# Patient Record
Sex: Female | Born: 1995
Health system: Southern US, Community
[De-identification: ages and names within clinical notes are randomized; demographics above are authoritative.]

## PROBLEM LIST (undated history)

## (undated) ENCOUNTER — Inpatient Hospital Stay (HOSPITAL_COMMUNITY): Payer: Self-pay

## (undated) DIAGNOSIS — O24419 Gestational diabetes mellitus in pregnancy, unspecified control: Secondary | ICD-10-CM

## (undated) DIAGNOSIS — I10 Essential (primary) hypertension: Secondary | ICD-10-CM

## (undated) DIAGNOSIS — O2230 Deep phlebothrombosis in pregnancy, unspecified trimester: Secondary | ICD-10-CM

## (undated) DIAGNOSIS — B009 Herpesviral infection, unspecified: Secondary | ICD-10-CM

## (undated) DIAGNOSIS — J45909 Unspecified asthma, uncomplicated: Secondary | ICD-10-CM

## (undated) HISTORY — PX: DILATION AND EVACUATION: SHX1459

## (undated) HISTORY — DX: Deep phlebothrombosis in pregnancy, unspecified trimester: O22.30

## (undated) HISTORY — DX: Gestational diabetes mellitus in pregnancy, unspecified control: O24.419

## (undated) HISTORY — DX: Unspecified asthma, uncomplicated: J45.909

---

## 2015-04-17 ENCOUNTER — Ambulatory Visit (INDEPENDENT_AMBULATORY_CARE_PROVIDER_SITE_OTHER): Payer: 59 | Admitting: Family Medicine

## 2015-04-17 VITALS — BP 114/68 | HR 56 | Temp 98.7°F | Resp 18 | Ht 64.5 in | Wt 146.0 lb

## 2015-04-17 DIAGNOSIS — Z3490 Encounter for supervision of normal pregnancy, unspecified, unspecified trimester: Secondary | ICD-10-CM

## 2015-04-17 DIAGNOSIS — Z3200 Encounter for pregnancy test, result unknown: Secondary | ICD-10-CM

## 2015-04-17 LAB — POCT URINE PREGNANCY: Preg Test, Ur: POSITIVE — AB

## 2015-04-17 NOTE — Patient Instructions (Signed)
RESOURCE GUIDE ° °Chronic Pain Problems: °Contact [facility]  [phone] °Patients need to be referred by their primary care doctor. ° °Insufficient Money for Medicine: °Contact United Way:  call (888) 892-1162 ° °No Primary Care Doctor: °- Call [facility]  [phone] - can help you locate a primary care doctor that  accepts your insurance, provides certain services, etc. °- Physician Referral Service- 1-800-533-3463 ° °Agencies that provide inexpensive medical care: °- [facility]  [phone] °- [facility]  [phone] °- [facility]  [phone] °- [facility]  832-4777 °- [facility]  [phone] °- [facility]  [phone] ° °Medicaid-accepting [place] Providers: °- [facility]- [address] ° [phone], Mon-Fri 9am-7pm, Sat 9am-1pm °- [facility]- [address], [address] ° [phone] °- [facility]- [address], [address] ° [phone] °- [facility]- [address] [place] [address] ° [phone] °- Veita B[facility]- [address], [phone] ° Only accepts [place] Access Medicaid patients after they have their name  applied to their card ° °Self Pay (no insurance) in [place]: °- Sickle Cell Patients - [facility] ° [address], [phone] °- [facility]- [address] ° [phone] °      -     [facility]- [address] °      -     [facility]- see information above (Speak to Pam H if you do not have insurance) °      -  [facility] [place]- [address],  [phone] °      -  [facility]- [address] [place] [address], [phone] °      -  Dr Osei-Bonsu-  [address], [place], [phone] °      -  [facility] - [address], [phone] °      -  [facility]- [address] [place] [address], [phone], also  [address], [phone] °      -     [facility]- [address], [phone], 1st & 3rd Saturday °        every month, 10am-1pm ° -     [facility] °  [address], [place]. °  Phone:  [phone], Fax:  [phone]. Hours of Operation:  9 am - 6 pm, M-F. ° -     [facility] °  [address], [address], [place] °  Phone: [phone], Fax: [phone]. Hours of Operation:  8:30 am - 5:30 pm, M-F. ° °[facility] °[address] °[place], [place] [address] °(336) 832-4777 ° °[facility] °[address] °[place], [place] [address] °(336) [phone] ° °1) Find a Doctor and Pay Out of Pocket °Although you won't have to find out who is covered by your insurance plan, it is a good idea to ask around and get recommendations. You will then need to call the office and see if the doctor you have chosen will accept you as a new patient and what types of options they offer for patients who are self-pay. Some doctors offer discounts or will set up payment plans for their patients who do not have insurance, but   you will need to ask so you aren't surprised when you get to your appointment. ° °2) Contact Your Local Health Department °Not all health departments have doctors that can see patients for sick visits, but many do, so it is worth a call to see if yours does. If you don't know where your local health department is, you can check in your phone book. The CDC also has a tool to help you locate your state's health department, and many state websites also have listings of all of their local health departments. ° °3) Find a Walk-in Clinic °If your illness is not likely to be very severe or complicated, you may want to try a walk in clinic. These are popping up all over the country in pharmacies, drugstores, and shopping centers. They're usually staffed by nurse practitioners or physician assistants that have been trained  to treat common illnesses and complaints. They're usually fairly quick and inexpensive. However, if you have serious medical issues or chronic medical problems, these are probably not your best option ° °STD Testing °- [place] [facility] [place], [address], [place], phone [phone] or [phone].  Monday - Friday, call for an appointment. °- [place] [facility] [place], [facility], [address], [place], phone [phone] or [phone].  Monday - Friday, call for an appointment. ° °Abuse/Neglect: °- [place] Child Abuse Hotline (336) 641-3795 °- [place] Child Abuse Hotline 800-378-5315 (After Hours) ° °Emergency Shelter:  [facility] (336) 271-5985 ° °Maternity Homes: °- [facility] (336) 275-9566 °- [facility] (704) 372-4663 ° °MRSA Hotline #:   832-7006 ° °Dental Assistance °If unable to pay or uninsured, contact:  [place] [facility] to become qualified for the adult dental clinic. ° °Patients with Medicaid: [facility] °[address], [phone] °[address], [phone] ° °If unable to pay, or uninsured, contact [place] [facility] ([phone] in [place], 842-7733 in [place]) to become qualified for the adult dental clinic ° °[facility] °[address] °[place], [place] [address] °(336) 272-4177 °www.[facility].com ° °Other Low-Cost Community Dental Services: °- [facility]- [address], [place], [place], [address], 723-1848, Ext. [address], 2nd and 4th Thursday of the month at 6:30am.  10 clients each day by appointment, can sometimes see walk-in patients if someone does not show for an appointment. °- [facility]- [address], [place], [place], [address], [phone] °- [facility]- [address], [place], [place], [address], 631-2330 °- [place]  [facility]- 342-8273 °- [facility]- 703-3100 °- [facility]- 570-6415 ° °     Behavioral Health Resources in the Community ° °Intensive Outpatient Programs: °[facility]      °[address] °[place], [place] °336-878-6098 °Both a day and evening program °      °[facility]     °[address]        °[place], [place] [address] °336-832-9800        ° °[facility] °[address] °[place] °336-882-2125 ° °[place] °ACCESS LINE: 1-800-853-5163 or 336-641-4981 °[address] °[place], [place] [address] °Http://www.[email or web address].com/services/adult.htm ° ° °Substance Abuse Resources: °- [facility]  336-882-2125 °- [facility] 336-784-9470 °- [facility] [place] [facility] 336-285-9073 °- [facility] 336-845-3988 °-   Residential & Outpatient Substance Abuse Program  800-659-3381 ° °Psychological Services: °- [facility]  832-9600 °- [facility]  378-7881 °- [facility], [address], [place], ACCESS LINE: 1-800-853-5163 or 336-641-4981, Http://www.[facility].com/services/adult.htm ° °Mobile Crisis Teams:         °                               °[facility]         °Mobile Crisis Care Unit °1-877-626-1772       °      °Assertive °[facility] °[address] [place] °336-834-9664 °                                        °Interventionist °Sharon DeEsch °[address] °[place] °336-554-5454 ° °Self-Help/Support Groups: °[facility] Variety of support groups °373-1402 (call for more info) ° °Narcotics Anonymous (NA) °[facility] °[address] °[place] - 2 meetings at this location ° °Residential Treatment Programs:  °[facility]      °[address]        °[place]       °866-801-8205        ° °[facility] °[address] °[place], [place]  [address] °704-293-8524 ° °[facility]  °[address] °[place], [place] [address] °336-845-3988 °Admissions: 8am-3pm M-F ° °[facility]     °[address]        °[place], [place] [address]       °336-841-1104        ° °[facility] °[address] °[place], [place] °336-379-7146 ° °[facility] [place] [facility] °[address] °[place], [place] °336-285-9073 ° °[facility] - Intensive Outpatient      °[address]     °[place], [place]       °852-3033        ° °[facility]     °[address] °[place], [place] °877-615-2722 or 336-784-9470 ° °[facility]), Medicaid °[address] °[place], [place] °336-227-7417 ° °[facility]                                               °[address] °[place] °800-659-3381 ° °[place] [facility]: °[facility]- 1-888-581-9988              ° °General Therapy                                                °Julie Brannon, PhD        °[address]                                       °[place],  [address]         °336-349-5553   °Insurance ° °[facility]   °[address] °[place], [place] [address] °336-349-4454 ° °[facility] °[address] [place], [place] [address] °336-342-8316 °Insurance/Medicaid/sponsorship through [facility] ° °[facility]                                              °[address]                                        °[place], [place] [address]    °Therapy/tele-psych/case         °336-342-8316        °  °[facility] °[address]  ° [place], [place]  [address]  °Adolescent/group home/case management °336-349-2233  °                                         °Julia Brannon PhD       °General therapy       °Insurance   °336-951-0000        ° °Dr. Arfeen, Insurance, M-F °336- 349-4544 ° °[facility] [place]  [facility]  [employer] [place] [facility] °[address]                 [address]         [address]  °[place]                                               [place] °Phone:  349-3220                                  Phone:  [phone]                   Phone:  342-8140 ° °[facility], 342-8316 °- [place] [facility] - [facility]- 1-888-581-9988 °      -     [facility] in [place], [address], °            336-349-4454, Insurance ° °[place] Child Abuse Hotline °(336) 342-1394 or (336) 342-3537 (After Hours) ° ° °

## 2015-04-17 NOTE — Progress Notes (Signed)
      Chief Complaint:  Chief Complaint  Patient presents with  . pregnancy test    took test two days ago and was positive    HPI: Tanya Fisher is a 19 y.o. female [facility] complaining of Wants pregnancy test, home ones are positive, LMP March 15, 2015  EDD based on LMP is 12/19/2015 [id number] No STD No alcohol Smoker: 1 cigars daily No ttaking MVT No chromsoomal abnormalities.     Past Medical History  Diagnosis Date  . Asthma    History reviewed. No pertinent past surgical history. Social History   Social History  . Marital Status: Single    Spouse Name: N/A  . Number of Children: N/A  . Years of Education: N/A   Social History Main Topics  . Smoking status: Current Every Day Smoker  . Smokeless tobacco: None  . Alcohol Use: No  . Drug Use: None  . Sexual Activity: Not Asked   Other Topics Concern  . None   Social History Narrative  . None   Family History  Problem Relation Age of Onset  . Hypertension Mother   . Diabetes Father   . Hypertension Father    No Known Allergies Prior to Admission medications   Not on File     ROS: The patient denies fevers, chills, night sweats, unintentional weight loss, chest pain, palpitations, wheezing, dyspnea on exertion, nausea, vomiting, abdominal pain, dysuria, hematuria, melena, numbness, weakness, or tingling.   All other systems have been reviewed and were otherwise negative with the exception of those mentioned in the HPI and as above.    PHYSICAL EXAM: Filed Vitals:   04/17/15 1205  BP: 114/68  Pulse: 56  Temp: 98.7 F (37.1 C)  Resp: 18   Body mass index is 24.68 kg/(m^2).   General: Alert, no acute distress HEENT:  Normocephalic, atraumatic, oropharynx patent. EOMI, PERRLA Cardiovascular:  Regular rate and rhythm, no rubs murmurs or gallops.  No Carotid bruits, radial pulse intact. No pedal edema.  Respiratory: Clear to auscultation bilaterally.  No wheezes, rales, or  rhonchi.  No cyanosis, no use of accessory musculature Abdominal: No organomegaly, abdomen is soft and non-tender, positive bowel sounds. No masses. Skin: No rashes. Neurologic: Facial musculature symmetric. Psychiatric: Patient acts appropriately throughout our interaction. Lymphatic: No cervical or submandibular lymphadenopathy Musculoskeletal: Gait intact. No edema, tenderness   LABS: Results for orders placed or performed in visit on 04/17/15  POCT urine pregnancy  Result Value Ref Range   Preg Test, Ur Positive (A) Negative     EKG/XRAY:   Primary read [facility]   ASSESSMENT/PLAN: Encounter Diagnoses  Name Primary?  . Possible pregnancy   . Pregnant Yes   [facility] Encourage prenatal mvt  Gross sideeffects, risk and benefits, and alternatives of medications d/w patient. Patient is aware that all medications have potential sideeffects and we are unable to predict every sideeffect or drug-drug interaction that may occur.  Thao Le DO  04/17/2015 12:12 PM

## 2016-10-30 ENCOUNTER — Encounter: Payer: Self-pay | Admitting: Family Medicine

## 2016-10-30 ENCOUNTER — Ambulatory Visit (INDEPENDENT_AMBULATORY_CARE_PROVIDER_SITE_OTHER): Payer: 59 | Admitting: Family Medicine

## 2016-10-30 VITALS — BP 121/69 | HR 66 | Temp 98.4°F | Resp 16 | Ht 64.0 in | Wt 157.0 lb

## 2016-10-30 DIAGNOSIS — R350 Frequency of micturition: Secondary | ICD-10-CM

## 2016-10-30 DIAGNOSIS — R300 Dysuria: Secondary | ICD-10-CM | POA: Diagnosis not present

## 2016-10-30 DIAGNOSIS — N3001 Acute cystitis with hematuria: Secondary | ICD-10-CM | POA: Diagnosis not present

## 2016-10-30 LAB — POCT URINALYSIS DIP (MANUAL ENTRY)
BILIRUBIN UA: NEGATIVE
BILIRUBIN UA: NEGATIVE mg/dL
GLUCOSE UA: NEGATIVE mg/dL
NITRITE UA: NEGATIVE
Spec Grav, UA: 1.01 (ref 1.010–1.025)
Urobilinogen, UA: 0.2 E.U./dL
pH, UA: 5.5 (ref 5.0–8.0)

## 2016-10-30 LAB — POC MICROSCOPIC URINALYSIS (UMFC): MUCUS RE: ABSENT

## 2016-10-30 LAB — POCT URINE PREGNANCY: Preg Test, Ur: NEGATIVE

## 2016-10-30 MED ORDER — NITROFURANTOIN MONOHYD MACRO 100 MG PO CAPS: 100.0000 mg | ORAL_CAPSULE | Freq: Two times a day (BID) | ORAL | 0 refills | Status: DC

## 2016-10-30 MED ORDER — FLUCONAZOLE 150 MG PO TABS: 150.0000 mg | ORAL_TABLET | Freq: Once | ORAL | 0 refills | Status: AC

## 2016-10-30 MED ORDER — PRENATAL MULTIVITAMIN CH: 1.0000 | ORAL_TABLET | Freq: Every day | ORAL | 4 refills | Status: DC

## 2016-10-30 NOTE — Progress Notes (Signed)
Subjective:  By signing my name below, I, Stann Ore, attest that this documentation has been prepared under the direction and in the presence of Norberto Sorenson, MD. Electronically Signed: Stann Ore, Scribe. 10/30/2016 , 3:48 PM .  Patient was seen in Room 3 .   Patient ID: Tanya Fisher, female    DOB: 06-Feb-1996, 21 y.o.   MRN: [id number] Chief Complaint  Patient presents with  . Urinary Tract Infection    Dysuria, Urinary Frequency & Urgency   HPI Tanya Fisher is a 21 y.o. female who presents to Primary Care at [facility] complaining of possible UTI with dysuria, urinary frequency and urgency that started about 3 days ago. She describes dysuria being more "tingly", rather than burning. She's had an UTI in the past, but not since she was pregnant. She would normally self treat for yeast infections with mono stat. She mentions having some vaginal itching. Her periods and bowel has been normal. She denies bubble baths or swimming. She denies any recent antibiotics use. She hasn't taken any OTC medications for this. She denies vaginal discharge, fever, chills, nausea, vomiting or back pain.   She is a former smoker, quit over a year ago.   Past Medical History:  Diagnosis Date  . Asthma    Prior to Admission medications   Not on File   No Known Allergies  Review of Systems  Constitutional: Negative for chills, fatigue, fever and unexpected weight change.  Respiratory: Negative for cough.   Gastrointestinal: Negative for constipation, diarrhea, nausea and vomiting.  Genitourinary: Positive for dysuria, frequency and urgency. Negative for vaginal discharge.  Skin: Negative for rash and wound.  Neurological: Negative for dizziness, weakness and headaches.       Objective:   Physical Exam  Constitutional: She is oriented to person, place, and time. She appears well-developed and well-nourished. No distress.  HENT:  Head: Normocephalic and atraumatic.  Eyes: EOM are normal.  Pupils are equal, round, and reactive to light.  Neck: Neck supple.  Cardiovascular: Normal rate, regular rhythm, S1 normal, S2 normal and normal heart sounds.   No murmur heard. Pulmonary/Chest: Effort normal and breath sounds normal. No respiratory distress. She has no wheezes.  Abdominal: Soft. Bowel sounds are normal. She exhibits no distension. There is no hepatosplenomegaly. There is no tenderness. There is no CVA tenderness.  Musculoskeletal: Normal range of motion.  Neurological: She is alert and oriented to person, place, and time.  Skin: Skin is warm and dry.  Psychiatric: She has a normal mood and affect. Her behavior is normal.  Nursing note and vitals reviewed.   BP 121/69   Pulse 66   Temp 98.4 F (36.9 C) (Oral)   Resp 16   Ht  (1.626 m)   Wt 157 lb (71.2 kg)   LMP 10/03/2016   SpO2 100%   BMI 26.95 kg/m    Results for orders placed or performed in visit on 10/30/16  POCT Microscopic Urinalysis ([facility])  Result Value Ref Range   WBC,UR,HPF,POC Too numerous to count  (A) None WBC/hpf   RBC,UR,HPF,POC Few (A) None RBC/hpf   Bacteria Moderate (A) None, Too numerous to count   Mucus Absent Absent   Epithelial Cells, UR Per Microscopy Few (A) None, Too numerous to count cells/hpf  POCT urinalysis dipstick  Result Value Ref Range   Color, UA yellow yellow   Clarity, UA hazy (A) clear   Glucose, UA negative negative mg/dL   Bilirubin, UA negative negative  Ketones, POC UA negative negative mg/dL   Spec Grav, UA 1.610 9.604 - 1.025   Blood, UA moderate (A) negative   pH, UA 5.5 5.0 - 8.0   Protein Ur, POC trace (A) negative mg/dL   Urobilinogen, UA 0.2 0.2 or 1.0 E.U./dL   Nitrite, UA Negative Negative   Leukocytes, UA Large (3+) (A) Negative  POCT urine pregnancy  Result Value Ref Range   Preg Test, Ur Negative Negative        Assessment & Plan:   1. Dysuria   2. Urinary frequency   3. Acute cystitis with hematuria   Pt requests UPT today but  declines any contraception stating she has been on all of it before and it does not agree with her. She has a 10 mo son who she is still breastfeeding - not trying to get pregnant but ok if she does - "he is going to need someone to play with anyway."  Start pnv. Pt ok with std testing but did not request it and denies any sxs. She does report freq yeast infection which she successfully self-treats with otc monistat.  Orders Placed This Encounter  Procedures  . Urine culture  . GC/Chlamydia Probe Amp  . POCT Microscopic Urinalysis ([facility])  . POCT urinalysis dipstick  . POCT urine pregnancy    Meds ordered this encounter  Medications  . nitrofurantoin, macrocrystal-monohydrate, (MACROBID) 100 MG capsule    Sig: Take 1 capsule (100 mg total) by mouth 2 (two) times daily.    Dispense:  14 capsule    Refill:  0  . fluconazole (DIFLUCAN) 150 MG tablet    Sig: Take 1 tablet (150 mg total) by mouth once. After antibiotic course is complete if needed    Dispense:  1 tablet    Refill:  0  . Prenatal Vit-Fe Fumarate-FA (PRENATAL MULTIVITAMIN) TABS tablet    Sig: Take 1 tablet by mouth daily at 12 noon.    Dispense:  90 tablet    Refill:  4    I personally performed the services described in this documentation, which was scribed in my presence. The recorded information has been reviewed and considered, and addended by me as needed.   Norberto Sorenson, M.D.  Primary Care at [facility] [address] [place], [place] [address] [phone] phone [phone] fax  10/30/16 4:59 PM

## 2016-10-30 NOTE — Patient Instructions (Addendum)
   IF you received an x-ray today, you will receive an invoice from [facility]. Please contact [facility] at [phone] with questions or concerns regarding your invoice.   IF you received labwork today, you will receive an invoice from [facility]. Please contact [facility] at [phone] with questions or concerns regarding your invoice.   Our billing staff will not be able to assist you with questions regarding bills from these companies.  You will be contacted with the lab results as soon as they are available. The fastest way to get your results is to activate your My Chart account. Instructions are located on the last page of this paperwork. If you have not heard from us regarding the results in 2 weeks, please contact this office.     Urinary Tract Infection, Adult A urinary tract infection (UTI) is an infection of any part of the urinary tract, which includes the kidneys, ureters, bladder, and urethra. These organs make, store, and get rid of urine in the body. UTI can be a bladder infection (cystitis) or kidney infection (pyelonephritis). What are the causes? This infection may be caused by fungi, viruses, or bacteria. Bacteria are the most common cause of UTIs. This condition can also be caused by repeated incomplete emptying of the bladder during urination. What increases the risk? This condition is more likely to develop if:  You ignore your need to urinate or hold urine for long periods of time.  You do not empty your bladder completely during urination.  You wipe back to front after urinating or having a bowel movement, if you are female.  You are uncircumcised, if you are female.  You are constipated.  You have a urinary catheter that stays in place (indwelling).  You have a weak defense (immune) system.  You have a medical condition that affects your bowels, kidneys, or bladder.  You have diabetes.  You take antibiotic medicines frequently or  for long periods of time, and the antibiotics no longer work well against certain types of infections (antibiotic resistance).  You take medicines that irritate your urinary tract.  You are exposed to chemicals that irritate your urinary tract.  You are female. What are the signs or symptoms? Symptoms of this condition include:  Fever.  Frequent urination or passing small amounts of urine frequently.  Needing to urinate urgently.  Pain or burning with urination.  Urine that smells bad or unusual.  Cloudy urine.  Pain in the lower abdomen or back.  Trouble urinating.  Blood in the urine.  Vomiting or being less hungry than normal.  Diarrhea or abdominal pain.  Vaginal discharge, if you are female. How is this diagnosed? This condition is diagnosed with a medical history and physical exam. You will also need to provide a urine sample to test your urine. Other tests may be done, including:  Blood tests.  Sexually transmitted disease (STD) testing. If you have had more than one UTI, a cystoscopy or imaging studies may be done to determine the cause of the infections. How is this treated? Treatment for this condition often includes a combination of two or more of the following:  Antibiotic medicine.  Other medicines to treat less common causes of UTI.  Over-the-counter medicines to treat pain.  Drinking enough water to stay hydrated. Follow these instructions at home:  Take over-the-counter and prescription medicines only as told by your health care provider.  If you were prescribed an antibiotic, take it as told by your health care   provider. Do not stop taking the antibiotic even if you start to feel better.  Avoid alcohol, caffeine, tea, and carbonated beverages. They can irritate your bladder.  Drink enough fluid to keep your urine clear or pale yellow.  Keep all follow-up visits as told by your health care provider. This is important.  Make sure  to:  Empty your bladder often and completely. Do not hold urine for long periods of time.  Empty your bladder before and after sex.  Wipe from front to back after a bowel movement if you are female. Use each tissue one time when you wipe. Contact a health care provider if:  You have back pain.  You have a fever.  You feel nauseous or vomit.  Your symptoms do not get better after 3 days.  Your symptoms go away and then return. Get help right away if:  You have severe back pain or lower abdominal pain.  You are vomiting and cannot keep down any medicines or water. This information is not intended to replace advice given to you by your health care provider. Make sure you discuss any questions you have with your health care provider. Document Released: 03/31/2005 Document Revised: 12/03/2015 Document Reviewed: 05/12/2015 Elsevier Interactive Patient Education  2017 [employer] Inc.  

## 2016-11-02 LAB — URINE CULTURE

## 2016-11-02 LAB — GC/CHLAMYDIA PROBE AMP
Chlamydia trachomatis, NAA: NEGATIVE
NEISSERIA GONORRHOEAE BY PCR: NEGATIVE

## 2016-11-02 MED ORDER — CIPROFLOXACIN HCL 500 MG PO TABS: 500.0000 mg | ORAL_TABLET | Freq: Two times a day (BID) | ORAL | 0 refills | Status: DC

## 2016-11-02 NOTE — Addendum Note (Signed)
Addended by: Norberto Sorenson on: 11/02/2016 06:04 PM   Modules accepted: Orders

## 2017-04-11 ENCOUNTER — Ambulatory Visit (INDEPENDENT_AMBULATORY_CARE_PROVIDER_SITE_OTHER): Payer: 59 | Admitting: Physician Assistant

## 2017-04-11 VITALS — BP 108/72 | HR 71 | Resp 16 | Ht 64.0 in | Wt 158.0 lb

## 2017-04-11 DIAGNOSIS — R42 Dizziness and giddiness: Secondary | ICD-10-CM

## 2017-04-11 DIAGNOSIS — Z3490 Encounter for supervision of normal pregnancy, unspecified, unspecified trimester: Secondary | ICD-10-CM

## 2017-04-11 DIAGNOSIS — D509 Iron deficiency anemia, unspecified: Secondary | ICD-10-CM

## 2017-04-11 LAB — POCT CBC
GRANULOCYTE PERCENT: 54.8 % (ref 37–80)
HEMATOCRIT: 36.1 % — AB (ref 37.7–47.9)
HEMOGLOBIN: 11.5 g/dL — AB (ref 12.2–16.2)
LYMPH, POC: 2.6 (ref 0.6–3.4)
MCH, POC: 25.1 pg — AB (ref 27–31.2)
MCHC: 32 g/dL (ref 31.8–35.4)
MCV: 78.5 fL — AB (ref 80–97)
MID (cbc): 0.2 (ref 0–0.9)
MPV: 7.9 fL (ref 0–99.8)
POC GRANULOCYTE: 3.5 (ref 2–6.9)
POC LYMPH PERCENT: 41.8 %L (ref 10–50)
POC MID %: 3.4 %M (ref 0–12)
Platelet Count, POC: 299 10*3/uL (ref 142–424)
RBC: 4.59 M/uL (ref 4.04–5.48)
RDW, POC: 18.5 %
WBC: 6.3 10*3/uL (ref 4.6–10.2)

## 2017-04-11 LAB — POCT URINALYSIS DIP (MANUAL ENTRY)
Bilirubin, UA: NEGATIVE
GLUCOSE UA: NEGATIVE mg/dL
Ketones, POC UA: NEGATIVE mg/dL
Leukocytes, UA: NEGATIVE
NITRITE UA: NEGATIVE
Protein Ur, POC: NEGATIVE mg/dL
RBC UA: NEGATIVE
SPEC GRAV UA: 1.015 (ref 1.010–1.025)
UROBILINOGEN UA: 0.2 U/dL
pH, UA: 7 (ref 5.0–8.0)

## 2017-04-11 LAB — POCT URINE PREGNANCY: Preg Test, Ur: POSITIVE — AB

## 2017-04-11 MED ORDER — FERROUS SULFATE 325 (65 FE) MG PO TABS: 325.0000 mg | ORAL_TABLET | ORAL | 1 refills | Status: DC

## 2017-04-11 NOTE — Patient Instructions (Signed)
You must take small sips of water throughout the day.  Every 15 minutes.  I would like you to start a pre-natal vitamin at this time.   Continue the iron supplement.   First Trimester of Pregnancy The first trimester of pregnancy is from week 1 until the end of week 13 (months 1 through 3). During this time, your baby will begin to develop inside you. At 6-8 weeks, the eyes and face are formed, and the heartbeat can be seen on ultrasound. At the end of 12 weeks, all the baby's organs are formed. Prenatal care is all the medical care you receive before the birth of your baby. Make sure you get good prenatal care and follow all of your doctor's instructions. Follow these instructions at home: Medicines  Take over-the-counter and prescription medicines only as told by your doctor. Some medicines are safe and some medicines are not safe during pregnancy.  Take a prenatal vitamin that contains at least 600 micrograms (mcg) of folic acid.  If you have trouble pooping (constipation), take medicine that will make your stool soft (stool softener) if your doctor approves. Eating and drinking  Eat regular, healthy meals.  Your doctor will tell you the amount of weight gain that is right for you.  Avoid raw meat and uncooked cheese.  If you feel sick to your stomach (nauseous) or throw up (vomit): ? Eat 4 or 5 small meals a day instead of 3 large meals. ? Try eating a few soda crackers. ? Drink liquids between meals instead of during meals.  To prevent constipation: ? Eat foods that are high in fiber, like fresh fruits and vegetables, whole grains, and beans. ? Drink enough fluids to keep your pee (urine) clear or pale yellow. Activity  Exercise only as told by your doctor. Stop exercising if you have cramps or pain in your lower belly (abdomen) or low back.  Do not exercise if it is too hot, too humid, or if you are in a place of great height (high altitude).  Try to avoid standing for long  periods of time. Move your legs often if you must stand in one place for a long time.  Avoid heavy lifting.  Wear low-heeled shoes. Sit and stand up straight.  You can have sex unless your doctor tells you not to. Relieving pain and discomfort  Wear a good support bra if your breasts are sore.  Take warm water baths (sitz baths) to soothe pain or discomfort caused by hemorrhoids. Use hemorrhoid cream if your doctor says it is okay.  Rest with your legs raised if you have leg cramps or low back pain.  If you have puffy, bulging veins (varicose veins) in your legs: ? Wear support hose or compression stockings as told by your doctor. ? Raise (elevate) your feet for 15 minutes, 3-4 times a day. ? Limit salt in your food. Prenatal care  Schedule your prenatal visits by the twelfth week of pregnancy.  Write down your questions. Take them to your prenatal visits.  Keep all your prenatal visits as told by your doctor. This is important. Safety  Wear your seat belt at all times when driving.  Make a list of emergency phone numbers. The list should include numbers for family, friends, the hospital, and police and fire departments. General instructions  Ask your doctor for a referral to a local prenatal class. Begin classes no later than at the start of month 6 of your pregnancy.  Ask for  help if you need counseling or if you need help with nutrition. Your doctor can give you advice or tell you where to go for help.  Do not use hot tubs, steam rooms, or saunas.  Do not douche or use tampons or scented sanitary pads.  Do not cross your legs for long periods of time.  Avoid all herbs and alcohol. Avoid drugs that are not approved by your doctor.  Do not use any tobacco products, including cigarettes, chewing tobacco, and electronic cigarettes. If you need help quitting, ask your doctor. You may get counseling or other support to help you quit.  Avoid cat litter boxes and soil used  by cats. These carry germs that can cause birth defects in the baby and can cause a loss of your baby (miscarriage) or stillbirth.  Visit your dentist. At home, brush your teeth with a soft toothbrush. Be gentle when you floss. Contact a doctor if:  You are dizzy.  You have mild cramps or pressure in your lower belly.  You have a nagging pain in your belly area.  You continue to feel sick to your stomach, you throw up, or you have watery poop (diarrhea).  You have a bad smelling fluid coming from your vagina.  You have pain when you pee (urinate).  You have increased puffiness (swelling) in your face, hands, legs, or ankles. Get help right away if:  You have a fever.  You are leaking fluid from your vagina.  You have spotting or bleeding from your vagina.  You have very bad belly cramping or pain.  You gain or lose weight rapidly.  You throw up blood. It may look like coffee grounds.  You are around people who have [place] measles, fifth disease, or chickenpox.  You have a very bad headache.  You have shortness of breath.  You have any kind of trauma, such as from a fall or a car accident. Summary  The first trimester of pregnancy is from week 1 until the end of week 13 (months 1 through 3).  To take care of yourself and your unborn baby, you will need to eat healthy meals, take medicines only if your doctor tells you to do so, and do activities that are safe for you and your baby.  Keep all follow-up visits as told by your doctor. This is important as your doctor will have to ensure that your baby is healthy and growing well. This information is not intended to replace advice given to you by your health care provider. Make sure you discuss any questions you have with your health care provider. Document Released: 12/08/2007 Document Revised: 06/29/2016 Document Reviewed: 06/29/2016 Elsevier Interactive Patient Education  2017 [employer].

## 2017-04-11 NOTE — Progress Notes (Signed)
PRIMARY CARE AT [facility] [address], [place] [address] [phone]  Date:  04/11/2017   Name:  Tanya Fisher   DOB:  December 23, 1995   MRN:  [id number]  PCP:  Patient, No Pcp Per    History of Present Illness:  Tanya Fisher is a 21 y.o. female patient who presents to PCP with  Chief Complaint  Patient presents with  . Possible Pregnancy    took test last week, pos results.     Patient is here today to confirm pregnancy.  She has not had her period for at least 2 months.  She can not recall her last menstrual period.  Perhaps 8/1 vs 8/25.  She recently started her period 4 months ago, from her first child She reports that she feels occasionally lightheaded with standing and some weakness of her legs, and arms.  She has not had any chest pains, sob, or leg swelling.  No vaginal bleeding.  She has a hx of iron deficiency anemia.  She did not follow through with thsi and stopped her iron pill months ago.  She took 1 tablet last night, and today feels tremendously better.  She is not taking any prenatals at this time.  She is not hydrating well as she is nauseous with this.    There are no active problems to display for this patient.   Past Medical History:  Diagnosis Date  . Asthma     No past surgical history on file.  Social History  Substance Use Topics  . Smoking status: Former Smoker    Quit date: 07/06/2015  . Smokeless tobacco: Never Used  . Alcohol use No    Family History  Problem Relation Age of Onset  . Hypertension Mother   . Diabetes Father   . Hypertension Father     No Known Allergies  Medication list has been reviewed and updated.  Current Outpatient Prescriptions on File Prior to Visit  Medication Sig Dispense Refill  . ciprofloxacin (CIPRO) 500 MG tablet Take 1 tablet (500 mg total) by mouth 2 (two) times daily. (Patient not taking: Reported on 04/11/2017) 10 tablet 0  . Prenatal Vit-Fe Fumarate-FA (PRENATAL MULTIVITAMIN) TABS tablet Take 1 tablet by  mouth daily at 12 noon. (Patient not taking: Reported on 04/11/2017) 90 tablet 4   No current facility-administered medications on file prior to visit.     ROS ROS otherwise unremarkable unless listed above.  Physical Examination: BP 108/72   Pulse 71   Resp 16   Ht  (1.626 m)   Wt 158 lb (71.7 kg)   LMP  (LMP Unknown)   SpO2 100%   BMI 27.12 kg/m  Ideal Body Weight: Weight in (lb) to have BMI = 25: 145.3  Physical Exam  Constitutional: She is oriented to person, place, and time. She appears well-developed and well-nourished. No distress.  HENT:  Head: Normocephalic and atraumatic.  Right Ear: External ear normal.  Left Ear: External ear normal.  Eyes: Pupils are equal, round, and reactive to light. Conjunctivae and EOM are normal.  Cardiovascular: Normal rate and regular rhythm.  Exam reveals no friction rub.   No murmur heard. Pulmonary/Chest: Effort normal. No respiratory distress.  Neurological: She is alert and oriented to person, place, and time.  Skin: She is not diaphoretic.  Psychiatric: She has a normal mood and affect. Her behavior is normal.   Results for orders placed or performed in visit on 04/11/17  Ferritin  Result Value Ref Range  Ferritin 9 (L) 15 - 150 ng/mL  Iron  Result Value Ref Range   Iron 88 27 - 159 ug/dL  POCT urine pregnancy  Result Value Ref Range   Preg Test, Ur Positive (A) Negative  POCT urinalysis dipstick  Result Value Ref Range   Color, UA yellow yellow   Clarity, UA clear clear   Glucose, UA negative negative mg/dL   Bilirubin, UA negative negative   Ketones, POC UA negative negative mg/dL   Spec Grav, UA 1.610 9.604 - 1.025   Blood, UA negative negative   pH, UA 7.0 5.0 - 8.0   Protein Ur, POC negative negative mg/dL   Urobilinogen, UA 0.2 0.2 or 1.0 E.U./dL   Nitrite, UA Negative Negative   Leukocytes, UA Negative Negative  POCT CBC  Result Value Ref Range   WBC 6.3 4.6 - 10.2 K/uL   Lymph, poc 2.6 0.6 - 3.4    POC LYMPH PERCENT 41.8 10 - 50 %L   MID (cbc) 0.2 0 - 0.9   POC MID % 3.4 0 - 12 %M   POC Granulocyte 3.5 2 - 6.9   Granulocyte percent 54.8 37 - 80 %G   RBC 4.59 4.04 - 5.48 M/uL   Hemoglobin 11.5 (A) 12.2 - 16.2 g/dL   HCT, POC 54.0 (A) 98.1 - 47.9 %   MCV 78.5 (A) 80 - 97 fL   MCH, POC 25.1 (A) 27 - 31.2 pg   MCHC 32.0 31.8 - 35.4 g/dL   RDW, POC 19.1 %   Platelet Count, POC 299 142 - 424 K/uL   MPV 7.9 0 - 99.8 fL    Orthostatic VS for the past 24 hrs (Last 3 readings):  BP- Lying Pulse- Lying BP- Sitting Pulse- Sitting BP- Standing at 0 minutes Pulse- Standing at 0 minutes  04/11/17 1805 106/64 70 109/69 73 103/71 85   Assessment and Plan: Tanya Fisher is a 21 y.o. female who is here today for cc of  Chief Complaint  Patient presents with  . Possible Pregnancy    took test last week, pos results.  --hemoglobin minimally decreased.  Iron/ferritin obtained today. --given ferrous sulfate. --referral for ob given today and appreciated.  Gestational age unknown at this time, 6-9 months possibly.  --advised sips of water throughout the day. Pregnancy, unspecified gestational age - Plan: POCT urine pregnancy, POCT urinalysis dipstick, POCT CBC, Ambulatory referral to Obstetrics / Gynecology, Ferritin, Iron, ferrous sulfate 325 (65 FE) MG tablet  Lightheadedness - Plan: Ambulatory referral to Obstetrics / Gynecology  Iron deficiency anemia, unspecified iron deficiency anemia type - Plan: Ambulatory referral to Obstetrics / Gynecology, Ferritin, Iron, ferrous sulfate 325 (65 FE) MG tablet  [name], PA-C [facility] 10/9/20189:59 AM

## 2017-04-12 ENCOUNTER — Encounter: Payer: Self-pay | Admitting: Physician Assistant

## 2017-04-12 LAB — FERRITIN: FERRITIN: 9 ng/mL — AB (ref 15–150)

## 2017-04-12 LAB — IRON: Iron: 88 ug/dL (ref 27–159)

## 2017-04-21 ENCOUNTER — Emergency Department (HOSPITAL_BASED_OUTPATIENT_CLINIC_OR_DEPARTMENT_OTHER): Payer: 59

## 2017-04-21 ENCOUNTER — Encounter (HOSPITAL_BASED_OUTPATIENT_CLINIC_OR_DEPARTMENT_OTHER): Payer: Self-pay | Admitting: *Deleted

## 2017-04-21 ENCOUNTER — Emergency Department (HOSPITAL_BASED_OUTPATIENT_CLINIC_OR_DEPARTMENT_OTHER)
Admission: EM | Admit: 2017-04-21 | Discharge: 2017-04-21 | Disposition: A | Discharge status: Home / Self Care | Payer: 59 | Attending: Emergency Medicine | Admitting: Emergency Medicine

## 2017-04-21 DIAGNOSIS — J45909 Unspecified asthma, uncomplicated: Secondary | ICD-10-CM | POA: Diagnosis not present

## 2017-04-21 DIAGNOSIS — I82491 Acute embolism and thrombosis of other specified deep vein of right lower extremity: Secondary | ICD-10-CM | POA: Insufficient documentation

## 2017-04-21 DIAGNOSIS — Z79899 Other long term (current) drug therapy: Secondary | ICD-10-CM | POA: Diagnosis not present

## 2017-04-21 DIAGNOSIS — O2230 Deep phlebothrombosis in pregnancy, unspecified trimester: Secondary | ICD-10-CM | POA: Insufficient documentation

## 2017-04-21 DIAGNOSIS — M79604 Pain in right leg: Secondary | ICD-10-CM | POA: Diagnosis present

## 2017-04-21 DIAGNOSIS — Z87891 Personal history of nicotine dependence: Secondary | ICD-10-CM | POA: Diagnosis not present

## 2017-04-21 LAB — COMPREHENSIVE METABOLIC PANEL
ALK PHOS: 64 U/L (ref 38–126)
ALT: 13 U/L — ABNORMAL LOW (ref 14–54)
ANION GAP: 6 (ref 5–15)
AST: 15 U/L (ref 15–41)
Albumin: 3.8 g/dL (ref 3.5–5.0)
BILIRUBIN TOTAL: 0.6 mg/dL (ref 0.3–1.2)
BUN: 9 mg/dL (ref 6–20)
CALCIUM: 9.3 mg/dL (ref 8.9–10.3)
CO2: 23 mmol/L (ref 22–32)
Chloride: 104 mmol/L (ref 101–111)
Creatinine, Ser: 0.52 mg/dL (ref 0.44–1.00)
GLUCOSE: 99 mg/dL (ref 65–99)
Potassium: 4 mmol/L (ref 3.5–5.1)
Sodium: 133 mmol/L — ABNORMAL LOW (ref 135–145)
TOTAL PROTEIN: 7.8 g/dL (ref 6.5–8.1)

## 2017-04-21 LAB — CBC WITH DIFFERENTIAL/PLATELET
Basophils Absolute: 0 10*3/uL (ref 0.0–0.1)
Basophils Relative: 0 %
Eosinophils Absolute: 0 10*3/uL (ref 0.0–0.7)
Eosinophils Relative: 1 %
HEMATOCRIT: 34.6 % — AB (ref 36.0–46.0)
HEMOGLOBIN: 11.2 g/dL — AB (ref 12.0–15.0)
Lymphocytes Relative: 31 %
Lymphs Abs: 1.9 10*3/uL (ref 0.7–4.0)
MCH: 26.2 pg (ref 26.0–34.0)
MCHC: 32.4 g/dL (ref 30.0–36.0)
MCV: 80.8 fL (ref 78.0–100.0)
MONOS PCT: 7 %
Monocytes Absolute: 0.4 10*3/uL (ref 0.1–1.0)
NEUTROS ABS: 3.8 10*3/uL (ref 1.7–7.7)
NEUTROS PCT: 61 %
Platelets: 264 10*3/uL (ref 150–400)
RBC: 4.28 MIL/uL (ref 3.87–5.11)
RDW: 17.6 % — ABNORMAL HIGH (ref 11.5–15.5)
WBC: 6.2 10*3/uL (ref 4.0–10.5)

## 2017-04-21 MED ORDER — ENOXAPARIN SODIUM 150 MG/ML ~~LOC~~ SOLN: 1.0000 mg/kg | Freq: Two times a day (BID) | SUBCUTANEOUS | 0 refills | Status: DC

## 2017-04-21 MED ORDER — ENOXAPARIN SODIUM 80 MG/0.8ML ~~LOC~~ SOLN
1.0000 mg/kg | Freq: Once | SUBCUTANEOUS | Status: AC
  Administered 2017-04-21: 70 mg via SUBCUTANEOUS
  Filled 2017-04-21: qty 0.8

## 2017-04-21 MED FILL — ENOXAPARIN 80 MG/0.8 ML SYR: 80 | 30 days supply | Qty: 48 | Fill #0

## 2017-04-21 NOTE — ED Notes (Signed)
ED Provider at bedside. 

## 2017-04-21 NOTE — ED Triage Notes (Signed)
Pain in her right lower leg x 3 days. No injury. Swelling in the mornings but it goes away throughout the day. She is about [redacted] weeks pregnant.

## 2017-04-21 NOTE — ED Provider Notes (Signed)
[facility] EMERGENCY DEPARTMENT Provider Note   CSN: [id number] Arrival date & time: 04/21/17  1228     History   Chief Complaint Chief Complaint  Patient presents with  . Leg Pain    HPI Tanya Fisher is a 21 y.o. female.  HPI 21 year old female who recently found out she was pregnant presents to the emergency department with moderate aching right leg pain and swelling for 3 days. Pain is exacerbated with palpation of the right and with ambulation. No alleviating factors. Swelling is noted to be worse in the morning and improves throughout the day. Denies any injuries or strenuous activity.No recent fevers or infections No prior history of DVT/blood clots. Denies any other physical complaints.  Last menstrual period August 25.   Past Medical History:  Diagnosis Date  . Asthma     There are no active problems to display for this patient.   Past Surgical History:  Procedure Laterality Date  . CESAREAN SECTION    . DILATION AND EVACUATION     Therapeutic abortion 14 wks    OB History    Gravida Para Term Preterm AB Living   3 1 1  0 1 1   SAB TAB Ectopic Multiple Live Births   0 1 0 0 1       Home Medications    Prior to Admission medications   Medication Sig Start Date End Date Taking? Authorizing Provider  ferrous sulfate 325 (65 FE) MG tablet Take 1 tablet (325 mg total) by mouth every other day. 04/11/17  Yes English, Judeth CornfieldStephanie D, PA  ciprofloxacin (CIPRO) 500 MG tablet Take 1 tablet (500 mg total) by mouth 2 (two) times daily. Patient not taking: Reported on 04/11/2017 11/02/16   Sherren MochaShaw, Eva N, MD  enoxaparin (LOVENOX) 150 MG/ML injection Inject 0.48 mLs (70 mg total) into the skin every 12 (twelve) hours. 04/21/17 07/20/17  Nira Connardama, Kazaria Gaertner Eduardo, MD  Prenatal Vit-Fe Fumarate-FA (PRENATAL MULTIVITAMIN) TABS tablet Take 1 tablet by mouth daily at 12 noon. Patient not taking: Reported on 04/11/2017 10/30/16   Sherren MochaShaw, Eva N, MD    Family History Family  History  Problem Relation Age of Onset  . Hypertension Mother   . Diabetes Father   . Hypertension Father     Social History Social History  Substance Use Topics  . Smoking status: Former Smoker    Quit date: 07/06/2015  . Smokeless tobacco: Never Used  . Alcohol use No     Allergies   Patient has no known allergies.   Review of Systems Review of Systems All other systems are reviewed and are negative for acute change except as noted in the HPI   Physical Exam Updated Vital Signs BP 108/69   Pulse 74   Temp 97.8 F (36.6 C) (Oral)   Resp 18   Ht 5\' 4"  (1.626 m)   Wt 71.7 kg (158 lb)   LMP 02/26/2017   SpO2 99%   BMI 27.12 kg/m   Physical Exam  Constitutional: She is oriented to person, place, and time. She appears well-developed and well-nourished. No distress.  HENT:  Head: Normocephalic and atraumatic.  Right Ear: External ear normal.  Left Ear: External ear normal.  Nose: Nose normal.  Eyes: Conjunctivae and EOM are normal. No scleral icterus.  Neck: Normal range of motion and phonation normal.  Cardiovascular: Normal rate and regular rhythm.   Pulmonary/Chest: Effort normal. No stridor. No respiratory distress.  Abdominal: She exhibits no distension.  Musculoskeletal: Normal  range of motion. She exhibits no edema.       Right lower leg: She exhibits tenderness (to calf) and swelling (when compared to contralateral side).  Neurological: She is alert and oriented to person, place, and time.  Skin: She is not diaphoretic.  Psychiatric: She has a normal mood and affect. Her behavior is normal.  Vitals reviewed.    ED Treatments / Results  Labs (all labs ordered are listed, but only abnormal results are displayed) Labs Reviewed  CBC WITH DIFFERENTIAL/PLATELET - Abnormal; Notable for the following:       Result Value   Hemoglobin 11.2 (*)    HCT 34.6 (*)    RDW 17.6 (*)    All other components within normal limits  COMPREHENSIVE METABOLIC PANEL -  Abnormal; Notable for the following:    Sodium 133 (*)    ALT 13 (*)    All other components within normal limits    EKG  EKG Interpretation None       Radiology US Venous Img Lower Unilateral Right  Result Date: 04/21/2017 CLINICAL DATA:  Acute right lower leg pain. EXAM: Right LOWER EXTREMITY VENOUS DOPPLER ULTRASOUND TECHNIQUE: Gray-scale sonography with graded compression, as well as color Doppler and duplex ultrasound were performed to evaluate the lower extremity deep venous systems from the level of the common femoral vein and including the common femoral, femoral, profunda femoral, popliteal and calf veins including the posterior tibial, peroneal and gastrocnemius veins when visible. The superficial great saphenous vein was also interrogated. Spectral Doppler was utilized to evaluate flow at rest and with distal augmentation maneuvers in the common femoral, femoral and popliteal veins. COMPARISON:  None. FINDINGS: Contralateral Common Femoral Vein: Respiratory phasicity is normal and symmetric with the symptomatic side. No evidence of thrombus. Normal compressibility. Common Femoral Vein: No evidence of thrombus. Normal compressibility, respiratory phasicity and response to augmentation. Saphenofemoral Junction: No evidence of thrombus. Normal compressibility and flow on color Doppler imaging. Profunda Femoral Vein: No evidence of thrombus. Normal compressibility and flow on color Doppler imaging. Femoral Vein: No evidence of thrombus. Normal compressibility, respiratory phasicity and response to augmentation. Popliteal Vein: No evidence of thrombus. Normal compressibility, respiratory phasicity and response to augmentation. Calf Veins: Both peroneal veins are noncompressible with no flow, consistent with deep venous thrombosis. Posterior tibial veins are unremarkable. Superficial Great Saphenous Vein: No evidence of thrombus. Normal compressibility. Venous Reflux:  None. Other Findings:   None. IMPRESSION: Acute deep venous thrombosis is seen involving the right peroneal veins. Electronically Signed   By: Lupita Raider, M.D.   On: 04/21/2017 15:12    Procedures Procedures (including critical care time)  Medications Ordered in ED Medications  enoxaparin (LOVENOX) injection 70 mg (70 mg Subcutaneous Given 04/21/17 1607)     Initial Impression / Assessment and Plan / ED Course  I have reviewed the triage vital signs and the nursing notes.  Pertinent labs & imaging results that were available during my care of the patient were reviewed by me and considered in my medical decision making (see chart for details).     Right lower extremity DVT. In the first trimester so will require Lovenox. Screening labs obtained to assess platelet count and renal function.  Platelets within normal limits. Renal function intact  Patient really has an OB/GYN and was instructed to follow-up with them for further management of the DVT.  The patient is safe for discharge with strict return precautions.    Final Clinical Impressions(s) / ED  Diagnoses   Final diagnoses:  DVT (deep vein thrombosis) in pregnancy [facility])   Disposition: Discharge  Condition: Good  I have discussed the results, Dx and Tx plan with the patient who expressed understanding and agree(s) with the plan. Discharge instructions discussed at great length. The patient was given strict return precautions who verbalized understanding of the instructions. No further questions at time of discharge.    Discharge Medication List as of 04/21/2017  3:43 PM    START taking these medications   Details  enoxaparin (LOVENOX) 150 MG/ML injection Inject 0.48 mLs (70 mg total) into the skin every 12 (twelve) hours., Starting Thu 04/21/2017, Until Wed 07/20/2017, Print        Follow Up: OB/GYN  Schedule an appointment as soon as possible for a visit  in 3-5 days for close follow up for right leg blood clot.        Nira Conn, MD 04/21/17 [phone]

## 2017-04-29 ENCOUNTER — Ambulatory Visit (INDEPENDENT_AMBULATORY_CARE_PROVIDER_SITE_OTHER): Payer: 59 | Admitting: Family Medicine

## 2017-04-29 ENCOUNTER — Encounter: Payer: Self-pay | Admitting: Family Medicine

## 2017-04-29 VITALS — BP 120/75 | HR 74 | Wt 154.6 lb

## 2017-04-29 DIAGNOSIS — O3680X0 Pregnancy with inconclusive fetal viability, not applicable or unspecified: Secondary | ICD-10-CM | POA: Diagnosis not present

## 2017-04-29 DIAGNOSIS — O34219 Maternal care for unspecified type scar from previous cesarean delivery: Secondary | ICD-10-CM

## 2017-04-29 DIAGNOSIS — Z1151 Encounter for screening for human papillomavirus (HPV): Secondary | ICD-10-CM

## 2017-04-29 DIAGNOSIS — O2230 Deep phlebothrombosis in pregnancy, unspecified trimester: Secondary | ICD-10-CM | POA: Insufficient documentation

## 2017-04-29 DIAGNOSIS — Z124 Encounter for screening for malignant neoplasm of cervix: Secondary | ICD-10-CM

## 2017-04-29 DIAGNOSIS — Z3481 Encounter for supervision of other normal pregnancy, first trimester: Secondary | ICD-10-CM

## 2017-04-29 DIAGNOSIS — O0990 Supervision of high risk pregnancy, unspecified, unspecified trimester: Secondary | ICD-10-CM | POA: Insufficient documentation

## 2017-04-29 DIAGNOSIS — Z113 Encounter for screening for infections with a predominantly sexual mode of transmission: Secondary | ICD-10-CM

## 2017-04-29 DIAGNOSIS — Z3490 Encounter for supervision of normal pregnancy, unspecified, unspecified trimester: Secondary | ICD-10-CM

## 2017-04-29 DIAGNOSIS — Z98891 History of uterine scar from previous surgery: Secondary | ICD-10-CM | POA: Insufficient documentation

## 2017-04-29 LAB — POCT URINALYSIS DIP (DEVICE)
Glucose, UA: NEGATIVE mg/dL
Hgb urine dipstick: NEGATIVE
KETONES UR: NEGATIVE mg/dL
Leukocytes, UA: NEGATIVE
Nitrite: NEGATIVE
PH: 5.5 (ref 5.0–8.0)
PROTEIN: NEGATIVE mg/dL
SPECIFIC GRAVITY, URINE: 1.025 (ref 1.005–1.030)
Urobilinogen, UA: 0.2 mg/dL (ref 0.0–1.0)

## 2017-04-29 MED ORDER — ENOXAPARIN SODIUM 80 MG/0.8ML ~~LOC~~ SOLN: 80.0000 mg | Freq: Two times a day (BID) | SUBCUTANEOUS | 11 refills | Status: DC

## 2017-04-29 NOTE — Progress Notes (Signed)
Subjective:  Tanya Fisher is a G3P1011 2555w2d being seen today for her first obstetrical visit.  Her obstetrical history is significant for DVT in pregnancy, previous cesarean for fetal indications. Never had DVT before. + Fm hx of DVT. Patient does intend to breast feed. Pregnancy history fully reviewed.  Patient reports nausea and vomiting.  BP 120/75   Pulse 74   Wt 154 lb 9.6 oz (70.1 kg)   LMP 02/26/2017 (Approximate)   BMI 26.54 kg/m   HISTORY: OB History  Gravida Para Term Preterm AB Living  3 1 1  0 1 1  SAB TAB Ectopic Multiple Live Births  0 1 0 0 1    # Outcome Date GA Lbr Len/2nd Weight Sex Delivery Anes PTL Lv  3 Current           2 TAB 07/2016          1 Term 12/15/15 [id number]  6 lb 9 oz (2.977 kg) M CS-Unspec None N LIV      Past Medical History:  Diagnosis Date  . Asthma     Past Surgical History:  Procedure Laterality Date  . CESAREAN SECTION    . DILATION AND EVACUATION     Therapeutic abortion 14 wks    Family History  Problem Relation Age of Onset  . Hypertension Mother   . Diabetes Father   . Hypertension Father      Exam    Uterus:     Pelvic Exam:    Perineum: No Hemorrhoids, Normal Perineum   Vulva: normal, Bartholin's, Urethra, Skene's normal   Vagina:  normal mucosa   Cervix: multiparous appearance, no bleeding following Pap, no cervical motion tenderness and no lesions   Adnexa: normal adnexa and no mass, fullness, tenderness   Bony Pelvis: gynecoid  System: Breast:  normal appearance, no masses or tenderness, Inspection negative, No nipple retraction or dimpling, No nipple discharge or bleeding, No axillary or supraclavicular adenopathy   Skin: normal coloration and turgor, no rashes    Neurologic: oriented, normal, gait normal; reflexes normal and symmetric   Extremities: normal strength, tone, and muscle mass, no deformities, no erythema, induration, or nodules   HEENT PERRLA and extra ocular movement intact   Mouth/Teeth  mucous membranes moist, pharynx normal without lesions   Neck supple and no masses   Cardiovascular: regular rate and rhythm, no murmurs or gallops   Respiratory:  appears well, vitals normal, no respiratory distress, acyanotic, normal RR, ear and throat exam is normal, neck free of mass or lymphadenopathy, chest clear, no wheezing, crepitations, rhonchi, normal symmetric air entry   Abdomen: soft, non-tender; bowel sounds normal; no masses,  no organomegaly   Urinary: urethral meatus normal      Assessment:    Pregnancy: [id number] Patient Active Problem List   Diagnosis Date Noted  . Encounter for supervision of low-risk pregnancy, antepartum 04/29/2017  . DVT (deep vein thrombosis) in pregnancy (HCC) 04/29/2017  . History of cesarean delivery 04/29/2017      Plan:   1. Encounter to determine fetal viability of pregnancy, single or unspecified fetus Genetic Screening discussed First Screen: ordered.  Ultrasound discussed; fetal survey: ordered.  Follow up in 4 weeks.  - US OB Limited; Future  2. Encounter for supervision of low-risk pregnancy, antepartum - Hemoglobin A1c - Hemoglobinopathy evaluation - Obstetric Panel, Including HIV - Culture, OB Urine - US MFM Fetal Nuchal Translucency; Future - Cytology - PAP  3. History of cesarean delivery Desires TOLAC  Problem list reviewed and updated. 75% of 30 min visit spent on counseling and coordination of care.     Levie Heritage 04/29/2017

## 2017-04-29 NOTE — Progress Notes (Signed)
Pt informed that the ultrasound is considered a limited OB ultrasound and is not intended to be a complete ultrasound exam.  Patient also informed that the ultrasound is not being completed with the intent of assessing for fetal or placental anomalies or any pelvic abnormalities.  Explained that the purpose of today's ultrasound is to assess for viability.  Patient acknowledges the purpose of the exam and the limitations of the study.    Single IUP Fetal pole - present Yolk sac - present FHR - 189 per M-mode CRL- 2.56 cm (9w 2d) FM observed

## 2017-04-29 NOTE — Progress Notes (Signed)
New ob packet given  First Trimester Screen scheduled for November 23rd 1015.  Pt notified.

## 2017-05-02 LAB — CYTOLOGY - PAP
Adequacy: ABSENT
Bacterial vaginitis: NEGATIVE
CANDIDA VAGINITIS: NEGATIVE
CHLAMYDIA, DNA PROBE: NEGATIVE
Diagnosis: NEGATIVE
Neisseria Gonorrhea: NEGATIVE
TRICH (WINDOWPATH): NEGATIVE

## 2017-05-02 LAB — OBSTETRIC PANEL, INCLUDING HIV
ANTIBODY SCREEN: NEGATIVE
BASOS: 0 %
Basophils Absolute: 0 10*3/uL (ref 0.0–0.2)
EOS (ABSOLUTE): 0 10*3/uL (ref 0.0–0.4)
EOS: 1 %
HEMATOCRIT: 33.9 % — AB (ref 34.0–46.6)
HEMOGLOBIN: 10.9 g/dL — AB (ref 11.1–15.9)
HIV SCREEN 4TH GENERATION: NONREACTIVE
Hepatitis B Surface Ag: NEGATIVE
IMMATURE GRANS (ABS): 0 10*3/uL (ref 0.0–0.1)
Immature Granulocytes: 0 %
LYMPHS: 38 %
Lymphocytes Absolute: 2 10*3/uL (ref 0.7–3.1)
MCH: 26.4 pg — AB (ref 26.6–33.0)
MCHC: 32.2 g/dL (ref 31.5–35.7)
MCV: 82 fL (ref 79–97)
MONOS ABS: 0.5 10*3/uL (ref 0.1–0.9)
Monocytes: 9 %
Neutrophils Absolute: 2.9 10*3/uL (ref 1.4–7.0)
Neutrophils: 52 %
Platelets: 309 10*3/uL (ref 150–379)
RBC: 4.13 x10E6/uL (ref 3.77–5.28)
RDW: 18.2 % — ABNORMAL HIGH (ref 12.3–15.4)
RH TYPE: POSITIVE
RPR Ser Ql: NONREACTIVE
Rubella Antibodies, IGG: 2.6 index (ref >0.99)
WBC: 5.4 10*3/uL (ref 3.4–10.8)

## 2017-05-02 LAB — HEMOGLOBINOPATHY EVALUATION
HEMOGLOBIN F QUANTITATION: 0 % (ref 0.0–2.0)
HGB A: 98.3 % (ref 96.4–98.8)
HGB C: 0 %
HGB S: 0 %
HGB VARIANT: 0 %
Hemoglobin A2 Quantitation: 1.7 % — ABNORMAL LOW (ref 1.8–3.2)

## 2017-05-02 LAB — HEMOGLOBIN A1C
ESTIMATED AVERAGE GLUCOSE: 103 mg/dL
HEMOGLOBIN A1C: 5.2 % (ref 4.8–5.6)

## 2017-05-04 LAB — URINE CULTURE, OB REFLEX

## 2017-05-04 LAB — CULTURE, OB URINE

## 2017-05-18 ENCOUNTER — Encounter (HOSPITAL_COMMUNITY): Payer: Self-pay | Admitting: Family Medicine

## 2017-05-24 ENCOUNTER — Encounter: Payer: 59 | Admitting: Family Medicine

## 2017-05-27 ENCOUNTER — Ambulatory Visit (HOSPITAL_COMMUNITY)
Admission: RE | Admit: 2017-05-27 | Discharge: 2017-05-27 | Disposition: A | Discharge status: Home / Self Care | Payer: 59 | Source: Ambulatory Visit | Attending: Family Medicine | Admitting: Family Medicine

## 2017-05-27 ENCOUNTER — Other Ambulatory Visit: Payer: Self-pay | Admitting: Family Medicine

## 2017-05-27 ENCOUNTER — Encounter (HOSPITAL_COMMUNITY): Payer: Self-pay

## 2017-05-27 DIAGNOSIS — O2232 Deep phlebothrombosis in pregnancy, second trimester: Secondary | ICD-10-CM | POA: Diagnosis not present

## 2017-05-27 DIAGNOSIS — Z3490 Encounter for supervision of normal pregnancy, unspecified, unspecified trimester: Secondary | ICD-10-CM

## 2017-05-27 DIAGNOSIS — Z3682 Encounter for antenatal screening for nuchal translucency: Secondary | ICD-10-CM

## 2017-05-27 DIAGNOSIS — O34219 Maternal care for unspecified type scar from previous cesarean delivery: Secondary | ICD-10-CM | POA: Diagnosis not present

## 2017-05-27 DIAGNOSIS — O2231 Deep phlebothrombosis in pregnancy, first trimester: Secondary | ICD-10-CM

## 2017-05-27 DIAGNOSIS — Z3A13 13 weeks gestation of pregnancy: Secondary | ICD-10-CM | POA: Diagnosis not present

## 2017-05-27 NOTE — Addendum Note (Signed)
Encounter addended by: Genevie CheshireWaken, Tulsi Crossett M, RT on: 05/27/2017 11:18 AM  Actions taken: Imaging Exam ended

## 2017-06-08 ENCOUNTER — Encounter: Payer: 59 | Admitting: Advanced Practice Midwife

## 2017-07-05 NOTE — L&D Delivery Note (Addendum)
Patient is 22 y.o. [id number] 6859w3d admitted for SOL for TOLAC, hx of prior C-section. S/p AROM, minimal fluid, clear  Delivery Note At 9:45 AM a viable female was delivered via VBAC, Spontaneous (Presentation: OA;  ).  APGAR: 8, 9;  Placenta status: 3V  Anesthesia:  None Episiotomy: None Lacerations:  Left periclitoral-hemostatic; right labial repaired w/ 3-0 vicryl rapide Est. Blood Loss (mL): 200  Mom to postpartum.  Baby to Couplet care / Skin to Skin.  Upon arrival patient was complete and pushing. She pushed with good maternal effort to deliver a healthy baby girl. Baby delivered without difficulty, was noted to have good tone and place on maternal abdomen for oral suctioning, drying and stimulation. Delayed cord clamping performed. Placenta delivered intact with 3V cord. Vaginal canal and perineum was inspected and hemostatic as described above. Brisk BRB. Firm fundus, but boggy lower uterine segment. Pitocin was started and rectal cytotec given. Uterus massaged until bleeding slowed. Counts of sharps, instruments, and lap pads were all correct.   Garnette GunnerAaron B Thompson, MD PGY-1 6/1/201911:21 AM  Midwife attestation: I was gloved and present for delivery in its entirety and I agree with the above resident's note.  Donette LarryMelanie Knoah Nedeau, CNM 9:43 AM

## 2017-07-06 ENCOUNTER — Encounter: Payer: Self-pay | Admitting: Obstetrics and Gynecology

## 2017-07-06 ENCOUNTER — Ambulatory Visit (INDEPENDENT_AMBULATORY_CARE_PROVIDER_SITE_OTHER): Payer: 59 | Admitting: Obstetrics and Gynecology

## 2017-07-06 VITALS — BP 115/56 | HR 92 | Wt 155.2 lb

## 2017-07-06 DIAGNOSIS — O0992 Supervision of high risk pregnancy, unspecified, second trimester: Secondary | ICD-10-CM

## 2017-07-06 DIAGNOSIS — Z3490 Encounter for supervision of normal pregnancy, unspecified, unspecified trimester: Secondary | ICD-10-CM

## 2017-07-06 DIAGNOSIS — Z3492 Encounter for supervision of normal pregnancy, unspecified, second trimester: Secondary | ICD-10-CM

## 2017-07-06 DIAGNOSIS — Z98891 History of uterine scar from previous surgery: Secondary | ICD-10-CM

## 2017-07-06 NOTE — Progress Notes (Signed)
Prenatal Visit Note Date: 07/06/2017 Clinic: [facility]  Subjective:  Tanya Fisher is a 22 y.o. G3P1011 at 3082w0d being seen today for ongoing prenatal care.  She is currently monitored for the following issues for this high-risk pregnancy and has Encounter for supervision of low-risk pregnancy, antepartum; DVT (deep vein thrombosis) in pregnancy [facility](HCC); and History of cesarean delivery on their problem list.  Patient reports no complaints.   Contractions: Irritability. Vag. Bleeding: None.  Movement: Absent. Denies leaking of fluid.   The following portions of the patient's history were reviewed and updated as appropriate: allergies, current medications, past family history, past medical history, past social history, past surgical history and problem list. Problem list updated.  Objective:   Vitals:   07/06/17 1346  BP: (!) 115/56  Pulse: 92  Weight: 155 lb 3.2 oz (70.4 kg)    Fetal Status: Fetal Heart Rate (bpm): 147   Movement: Absent     General:  Alert, oriented and cooperative. Patient is in no acute distress.  Skin: Skin is warm and dry. No rash noted.   Cardiovascular: Normal heart rate noted  Respiratory: Normal respiratory effort, no problems with respiration noted  Abdomen: Soft, gravid, appropriate for gestational age. Pain/Pressure: Present     Pelvic:  Cervical exam deferred        Extremities: Normal range of motion.  Edema: None  Mental Status: Normal mood and affect. Normal behavior. Normal judgment and thought content.   Urinalysis:      Assessment and Plan:  Pregnancy: G3P1011 at 4582w0d  1. Supervision of high risk pregnancy, antepartum, second trimester Declines afp. Anatomy u/s ordered. Still breastfeeding bid and sounds more comfort feeding. D/w her theoretical risk of early labor and given it's just comfort feeds and he's eating solids fine that she stop breast feeding/pumping sometime in the next few weeks - US MFM OB DETAIL +14 WK;  Future  2. History of VTE this pregnancy Pt not taking her bid lovenox consistently b/c she states it makes her feel dizzy with injection, which sounds similar to vagal response. Lives with boyfriend but states he doesn't feel comfortable with injection. D/w her extreme importance of taking the bid lovenox, namely, death, cva, mi, pe. D/w her re: 39wk delivery  3. History of cesarean delivery Will have her sign roi [facility]. Desiring tolac at this point  Preterm labor symptoms and general obstetric precautions including but not limited to vaginal bleeding, contractions, leaking of fluid and fetal movement were reviewed in detail with the patient. Please refer to After Visit Summary for other counseling recommendations.  Return in about 3 weeks (around 07/27/2017) for low risk ob.   [place] BingPickens, [name], MD

## 2017-07-14 ENCOUNTER — Ambulatory Visit (HOSPITAL_COMMUNITY)
Admission: RE | Admit: 2017-07-14 | Discharge: 2017-07-14 | Disposition: A | Discharge status: Home / Self Care | Payer: 59 | Source: Ambulatory Visit | Attending: Obstetrics and Gynecology | Admitting: Obstetrics and Gynecology

## 2017-07-14 ENCOUNTER — Other Ambulatory Visit: Payer: Self-pay | Admitting: Obstetrics and Gynecology

## 2017-07-14 DIAGNOSIS — O2232 Deep phlebothrombosis in pregnancy, second trimester: Secondary | ICD-10-CM | POA: Diagnosis not present

## 2017-07-14 DIAGNOSIS — O322XX0 Maternal care for transverse and oblique lie, not applicable or unspecified: Secondary | ICD-10-CM | POA: Diagnosis not present

## 2017-07-14 DIAGNOSIS — O34219 Maternal care for unspecified type scar from previous cesarean delivery: Secondary | ICD-10-CM

## 2017-07-14 DIAGNOSIS — Z3A20 20 weeks gestation of pregnancy: Secondary | ICD-10-CM

## 2017-07-14 DIAGNOSIS — Z3689 Encounter for other specified antenatal screening: Secondary | ICD-10-CM

## 2017-07-14 DIAGNOSIS — O0992 Supervision of high risk pregnancy, unspecified, second trimester: Secondary | ICD-10-CM

## 2017-08-05 ENCOUNTER — Telehealth: Payer: Self-pay | Admitting: *Deleted

## 2017-08-05 NOTE — Telephone Encounter (Addendum)
Called and spoke w/Jacqueline to the details of Rx. She provided the information needed to obtain prior auth from insurance. I called [employer] @ [phone] and spoke w/Tamara. All pertinent information provided. She submitted the request and stated that it will take 24 hrs for a response. A fax will be sent to our office regarding the authorization and the pt will be notified. I called pt and left VM on her cell phone stating that a request for her Lovenox has been submitted with her insurance. She will be notified once it is approved and can then notify her pharmacy that she needs to pick it up. If she has questions, she may call back and leave a message.   2/5  1220  Spoke w/Jacqueline - pharmacist @ [facility] and was informed that Lovenox Rx (brand) [facility] however pt's co-pay will be over $470 for a 30 day supply. Dr. Vergie LivingPickens notified of this information and that I am still working on this and hoping to find a solution.  2/6 1645  Spoke w/Lanette Floyce StakesGaines, Case manager regarding problem with high cost of Lovenox. She [facility]. Information was received that Medicaid would pay for the generic form of lovenox @ no cost to pt.   2/7 1630  Called pt and left message on cell phone stating that I need to speak with her tomorrow regarding the Lovenox Rx. Pt needs to be informed that she will need to [facility] - Rx e-prescribed. Also need to ask pt if she needs education/training for self administration of the medication.

## 2017-08-05 NOTE — Telephone Encounter (Signed)
Adela LankJacqueline from [facility] called re: lovenox.  Patient's insurance will no longer pay for lovenox as they have a limit of 35 out of 180 days. Can she have a different anticoagulant, if not she will need insurance authorization. Please return call.

## 2017-08-06 NOTE — Telephone Encounter (Signed)
Thanks for working on this, Tanya Fisher and Tanya Fisher, but, no, lovenox is the only thing she can be on during the pregnancy. After she delivers and if she doesn't breastfeed, then something else, but for the pregnancy, only lovenox is okay. Let me know any other questions or issues. Thanks!

## 2017-08-11 MED ORDER — ENOXAPARIN SODIUM 80 MG/0.8ML ~~LOC~~ SOLN: 80.0000 mg | Freq: Two times a day (BID) | SUBCUTANEOUS | 11 refills | Status: DC

## 2017-08-11 NOTE — Addendum Note (Signed)
Addended by: Jill SideAY, Lani Mendiola L on: 08/11/2017 05:04 PM   Modules accepted: Orders

## 2017-08-30 ENCOUNTER — Encounter: Payer: 59 | Admitting: Family Medicine

## 2017-08-31 ENCOUNTER — Ambulatory Visit (INDEPENDENT_AMBULATORY_CARE_PROVIDER_SITE_OTHER): Payer: 59 | Admitting: Family Medicine

## 2017-08-31 ENCOUNTER — Encounter: Payer: Self-pay | Admitting: General Practice

## 2017-08-31 VITALS — BP 119/73 | HR 95 | Wt 166.0 lb

## 2017-08-31 DIAGNOSIS — I82409 Acute embolism and thrombosis of unspecified deep veins of unspecified lower extremity: Secondary | ICD-10-CM

## 2017-08-31 DIAGNOSIS — O2232 Deep phlebothrombosis in pregnancy, second trimester: Secondary | ICD-10-CM

## 2017-08-31 DIAGNOSIS — Z23 Encounter for immunization: Secondary | ICD-10-CM

## 2017-08-31 DIAGNOSIS — Z98891 History of uterine scar from previous surgery: Secondary | ICD-10-CM

## 2017-08-31 DIAGNOSIS — O0992 Supervision of high risk pregnancy, unspecified, second trimester: Secondary | ICD-10-CM

## 2017-08-31 DIAGNOSIS — O2230 Deep phlebothrombosis in pregnancy, unspecified trimester: Secondary | ICD-10-CM

## 2017-08-31 NOTE — Patient Instructions (Signed)

## 2017-08-31 NOTE — Progress Notes (Signed)
   PRENATAL VISIT NOTE  Subjective:  Tanya Fisher is a 22 y.o. G3P1011 at 7818w0d being seen today for ongoing prenatal care.  She is currently monitored for the following issues for this high-risk pregnancy and has Encounter for supervision of low-risk pregnancy, antepartum; DVT (deep vein thrombosis) in pregnancy [facility](HCC); and History of cesarean delivery on their problem list.  Patient reports no complaints.  Contractions: Irritability. Vag. Bleeding: None.  Movement: Present. Denies leaking of fluid.   The following portions of the patient's history were reviewed and updated as appropriate: allergies, current medications, past family history, past medical history, past social history, past surgical history and problem list. Problem list updated.  Objective:   Vitals:   08/31/17 1418  BP: 119/73  Pulse: 95  Weight: 75.3 kg (166 lb)    Fetal Status: Fetal Heart Rate (bpm): 148 Fundal Height: 27 cm Movement: Present     General:  Alert, oriented and cooperative. Patient is in no acute distress.  Skin: Skin is warm and dry. No rash noted.   Cardiovascular: Normal heart rate noted  Respiratory: Normal respiratory effort, no problems with respiration noted  Abdomen: Soft, gravid, appropriate for gestational age.  Pain/Pressure: Present     Pelvic: Cervical exam deferred        Extremities: Normal range of motion.  Edema: None  Mental Status:  Normal mood and affect. Normal behavior. Normal judgment and thought content.   Assessment and Plan:  Pregnancy: G3P1011 at 3718w0d  1. Supervision of high risk pregnancy, antepartum, second trimester Patient is not fasting, so will come in for lab visit only tomorrow - CBC; Future - Glucose Tolerance, 2 Hours w/1 Hour; Future - HIV antibody; Future - RPR; Future - Tdap vaccine greater than or equal to 7yo IM  2. DVT (deep vein thrombosis) in pregnancy (HCC) Taking lovenox BID as prescribed, no issues   4. History of cesarean  delivery Planning for TOLAC. Discussed risks vs benefits  Preterm labor symptoms and general obstetric precautions including but not limited to vaginal bleeding, contractions, leaking of fluid and fetal movement were reviewed in detail with the patient. Please refer to After Visit Summary for other counseling recommendations.  Return in about 2 weeks (around 09/14/2017).   Rolm BookbinderAmber Favio Moder, DO

## 2017-09-05 ENCOUNTER — Other Ambulatory Visit: Payer: 59

## 2017-09-05 DIAGNOSIS — O0992 Supervision of high risk pregnancy, unspecified, second trimester: Secondary | ICD-10-CM

## 2017-09-06 LAB — GLUCOSE TOLERANCE, 2 HOURS W/ 1HR
GLUCOSE, 1 HOUR: 152 mg/dL (ref 65–179)
GLUCOSE, 2 HOUR: 111 mg/dL (ref 65–152)
Glucose, Fasting: 90 mg/dL (ref 65–91)

## 2017-09-06 LAB — CBC
HEMATOCRIT: 24.6 % — AB (ref 34.0–46.6)
Hemoglobin: 8 g/dL — ABNORMAL LOW (ref 11.1–15.9)
MCH: 27 pg (ref 26.6–33.0)
MCHC: 32.5 g/dL (ref 31.5–35.7)
MCV: 83 fL (ref 79–97)
Platelets: 271 10*3/uL (ref 150–379)
RBC: 2.96 x10E6/uL — AB (ref 3.77–5.28)
RDW: 15.2 % (ref 12.3–15.4)
WBC: 5.9 10*3/uL (ref 3.4–10.8)

## 2017-09-06 LAB — HIV ANTIBODY (ROUTINE TESTING W REFLEX): HIV Screen 4th Generation wRfx: NONREACTIVE

## 2017-09-06 LAB — RPR: RPR: NONREACTIVE

## 2017-09-27 ENCOUNTER — Encounter: Payer: Self-pay | Admitting: *Deleted

## 2017-09-27 ENCOUNTER — Ambulatory Visit (INDEPENDENT_AMBULATORY_CARE_PROVIDER_SITE_OTHER): Payer: 59 | Admitting: Family Medicine

## 2017-09-27 ENCOUNTER — Telehealth: Payer: Self-pay | Admitting: *Deleted

## 2017-09-27 VITALS — BP 125/70 | HR 97 | Wt 166.5 lb

## 2017-09-27 DIAGNOSIS — O0930 Supervision of pregnancy with insufficient antenatal care, unspecified trimester: Secondary | ICD-10-CM | POA: Insufficient documentation

## 2017-09-27 DIAGNOSIS — Z3490 Encounter for supervision of normal pregnancy, unspecified, unspecified trimester: Secondary | ICD-10-CM

## 2017-09-27 DIAGNOSIS — Z98891 History of uterine scar from previous surgery: Secondary | ICD-10-CM

## 2017-09-27 DIAGNOSIS — O99013 Anemia complicating pregnancy, third trimester: Secondary | ICD-10-CM

## 2017-09-27 DIAGNOSIS — O2230 Deep phlebothrombosis in pregnancy, unspecified trimester: Secondary | ICD-10-CM

## 2017-09-27 DIAGNOSIS — I82409 Acute embolism and thrombosis of unspecified deep veins of unspecified lower extremity: Secondary | ICD-10-CM

## 2017-09-27 DIAGNOSIS — O0933 Supervision of pregnancy with insufficient antenatal care, third trimester: Secondary | ICD-10-CM

## 2017-09-27 DIAGNOSIS — O0993 Supervision of high risk pregnancy, unspecified, third trimester: Secondary | ICD-10-CM

## 2017-09-27 DIAGNOSIS — O99019 Anemia complicating pregnancy, unspecified trimester: Secondary | ICD-10-CM | POA: Insufficient documentation

## 2017-09-27 DIAGNOSIS — D509 Iron deficiency anemia, unspecified: Secondary | ICD-10-CM

## 2017-09-27 MED ORDER — FERROUS SULFATE 325 (65 FE) MG PO TABS: 325.0000 mg | ORAL_TABLET | Freq: Two times a day (BID) | ORAL | 2 refills | Status: DC

## 2017-09-27 NOTE — Progress Notes (Signed)
   PRENATAL VISIT NOTE  Subjective:  Tanya Fisher is a 22 y.o. G3P1011 at 156w6d being seen today for ongoing prenatal care.  She is currently monitored for the following issues for this high-risk pregnancy and has High-risk pregnancy; DVT (deep vein thrombosis) in pregnancy (HCC); History of cesarean delivery; Limited prenatal care; and Anemia affecting pregnancy on their problem list.  Patient reports no complaints.  Contractions: Irritability. Vag. Bleeding: None.  Movement: Present. Denies leaking of fluid.   The following portions of the patient's history were reviewed and updated as appropriate: allergies, current medications, past family history, past medical history, past social history, past surgical history and problem list. Problem list updated.  Objective:   Vitals:   09/27/17 1142  BP: 125/70  Pulse: 97  Weight: 166 lb 8 oz (75.5 kg)    Fetal Status: Fetal Heart Rate (bpm): 138 Fundal Height: 29 cm Movement: Present     General:  Alert, oriented and cooperative. Patient is in no acute distress.  Skin: Skin is warm and dry. No rash noted.   Cardiovascular: Normal heart rate noted  Respiratory: Normal respiratory effort, no problems with respiration noted  Abdomen: Soft, gravid, appropriate for gestational age.        Pelvic: Cervical exam deferred        Extremities: Normal range of motion.  Edema: None  Mental Status:  Normal mood and affect. Normal behavior. Normal judgment and thought content.   Assessment and Plan:  Pregnancy: G3P1011 at 2056w6d  1. High-risk pregnancy in third trimester Continue prenatal care.   2. DVT (deep vein thrombosis) in pregnancy (HCC) Continue Lovenox Has muscle cramps, not worrisome for DVT recurrence  3. History of cesarean delivery Discussed TOLAC vs. RCS today--she will consider options, written info given and let us know her decision at next visit.  4. Limited prenatal care in third trimester   5. Anemia affecting pregnancy  in third trimester Lab Results  Component Value Date   HGB 8.0 (L) 09/05/2017  Has PICA--eating ice--long hx but no non-food items at present. Taking po iron--will give dose of IV iron x 2  Preterm labor symptoms and general obstetric precautions including but not limited to vaginal bleeding, contractions, leaking of fluid and fetal movement were reviewed in detail with the patient. Please refer to After Visit Summary for other counseling recommendations.  Return in 2 weeks (on 10/11/2017).   Frederik PearJulie P Degele, MD  No future appointments.

## 2017-09-27 NOTE — Telephone Encounter (Signed)
Per Dr Tawni LevyPratt's order, pt scheduled for feraheme infusion 4/4 @1000 . Called patient with appointment details. She voiced understanding and had no questions.

## 2017-09-27 NOTE — Progress Notes (Signed)
Pt c/o of leg cramping at night, also having fatigue/weakness, sob, and headaches.

## 2017-09-27 NOTE — Patient Instructions (Addendum)
 Breastfeeding Choosing to breastfeed is one of the best decisions you can make for yourself and your baby. A change in hormones during pregnancy causes your breasts to make breast milk in your milk-producing glands. Hormones prevent breast milk from being released before your baby is born. They also prompt milk flow after birth. Once breastfeeding has begun, thoughts of your baby, as well as his or her sucking or crying, can stimulate the release of milk from your milk-producing glands. Benefits of breastfeeding Research shows that breastfeeding offers many health benefits for infants and mothers. It also offers a cost-free and convenient way to feed your baby. For your baby  Your first milk (colostrum) helps your baby's digestive system to function better.  Special cells in your milk (antibodies) help your baby to fight off infections.  Breastfed babies are less likely to develop asthma, allergies, obesity, or type 2 diabetes. They are also at lower risk for sudden infant death syndrome (SIDS).  Nutrients in breast milk are better able to meet your baby's needs compared to infant formula.  Breast milk improves your baby's brain development. For you  Breastfeeding helps to create a very special bond between you and your baby.  Breastfeeding is convenient. Breast milk costs nothing and is always available at the correct temperature.  Breastfeeding helps to burn calories. It helps you to lose the weight that you gained during pregnancy.  Breastfeeding makes your uterus return faster to its size before pregnancy. It also slows bleeding (lochia) after you give birth.  Breastfeeding helps to lower your risk of developing type 2 diabetes, osteoporosis, rheumatoid arthritis, cardiovascular disease, and breast, ovarian, uterine, and endometrial cancer later in life. Breastfeeding basics Starting breastfeeding  Find a comfortable place to sit or lie down, with your neck and back  well-supported.  Place a pillow or a rolled-up blanket under your baby to bring him or her to the level of your breast (if you are seated). Nursing pillows are specially designed to help support your arms and your baby while you breastfeed.  Make sure that your baby's tummy (abdomen) is facing your abdomen.  Gently massage your breast. With your fingertips, massage from the outer edges of your breast inward toward the nipple. This encourages milk flow. If your milk flows slowly, you may need to continue this action during the feeding.  Support your breast with 4 fingers underneath and your thumb above your nipple (make the letter "C" with your hand). Make sure your fingers are well away from your nipple and your baby's mouth.  Stroke your baby's lips gently with your finger or nipple.  When your baby's mouth is open wide enough, quickly bring your baby to your breast, placing your entire nipple and as much of the areola as possible into your baby's mouth. The areola is the colored area around your nipple. ? More areola should be visible above your baby's upper lip than below the lower lip. ? Your baby's lips should be opened and extended outward (flanged) to ensure an adequate, comfortable latch. ? Your baby's tongue should be between his or her lower gum and your breast.  Make sure that your baby's mouth is correctly positioned around your nipple (latched). Your baby's lips should create a seal on your breast and be turned out (everted).  It is common for your baby to suck about 2-3 minutes in order to start the flow of breast milk. Latching Teaching your baby how to latch onto your breast properly is   very important. An improper latch can cause nipple pain, decreased milk supply, and poor weight gain in your baby. Also, if your baby is not latched onto your nipple properly, he or she may swallow some air during feeding. This can make your baby fussy. Burping your baby when you switch breasts  during the feeding can help to get rid of the air. However, teaching your baby to latch on properly is still the best way to prevent fussiness from swallowing air while breastfeeding. Signs that your baby has successfully latched onto your nipple  Silent tugging or silent sucking, without causing you pain. Infant's lips should be extended outward (flanged).  Swallowing heard between every 3-4 sucks once your milk has started to flow (after your let-down milk reflex occurs).  Muscle movement above and in front of his or her ears while sucking.  Signs that your baby has not successfully latched onto your nipple  Sucking sounds or smacking sounds from your baby while breastfeeding.  Nipple pain.  If you think your baby has not latched on correctly, slip your finger into the corner of your baby's mouth to break the suction and place it between your baby's gums. Attempt to start breastfeeding again. Signs of successful breastfeeding Signs from your baby  Your baby will gradually decrease the number of sucks or will completely stop sucking.  Your baby will fall asleep.  Your baby's body will relax.  Your baby will retain a small amount of milk in his or her mouth.  Your baby will let go of your breast by himself or herself.  Signs from you  Breasts that have increased in firmness, weight, and size 1-3 hours after feeding.  Breasts that are softer immediately after breastfeeding.  Increased milk volume, as well as a change in milk consistency and color by the fifth day of breastfeeding.  Nipples that are not sore, cracked, or bleeding.  Signs that your baby is getting enough milk  Wetting at least 1-2 diapers during the first 24 hours after birth.  Wetting at least 5-6 diapers every 24 hours for the first week after birth. The urine should be clear or pale yellow by the age of 5 days.  Wetting 6-8 diapers every 24 hours as your baby continues to grow and develop.  At least 3  stools in a 24-hour period by the age of 5 days. The stool should be soft and yellow.  At least 3 stools in a 24-hour period by the age of 7 days. The stool should be seedy and yellow.  No loss of weight greater than 10% of birth weight during the first 3 days of life.  Average weight gain of 4-7 oz (113-198 g) per week after the age of 4 days.  Consistent daily weight gain by the age of 5 days, without weight loss after the age of 2 weeks. After a feeding, your baby may spit up a small amount of milk. This is normal. Breastfeeding frequency and duration Frequent feeding will help you make more milk and can prevent sore nipples and extremely full breasts (breast engorgement). Breastfeed when you feel the need to reduce the fullness of your breasts or when your baby shows signs of hunger. This is called "breastfeeding on demand." Signs that your baby is hungry include:  Increased alertness, activity, or restlessness.  Movement of the head from side to side.  Opening of the mouth when the corner of the mouth or cheek is stroked (rooting).  Increased sucking   sounds, smacking lips, cooing, sighing, or squeaking.  Hand-to-mouth movements and sucking on fingers or hands.  Fussing or crying.  Avoid introducing a pacifier to your baby in the first 4-6 weeks after your baby is born. After this time, you may choose to use a pacifier. Research has shown that pacifier use during the first year of a baby's life decreases the risk of sudden infant death syndrome (SIDS). Allow your baby to feed on each breast as long as he or she wants. When your baby unlatches or falls asleep while feeding from the first breast, offer the second breast. Because newborns are often sleepy in the first few weeks of life, you may need to awaken your baby to get him or her to feed. Breastfeeding times will vary from baby to baby. However, the following rules can serve as a guide to help you make sure that your baby is  properly fed:  Newborns (babies 4 weeks of age or younger) may breastfeed every 1-3 hours.  Newborns should not go without breastfeeding for longer than 3 hours during the day or 5 hours during the night.  You should breastfeed your baby a minimum of 8 times in a 24-hour period.  Breast milk pumping Pumping and storing breast milk allows you to make sure that your baby is exclusively fed your breast milk, even at times when you are unable to breastfeed. This is especially important if you go back to work while you are still breastfeeding, or if you are not able to be present during feedings. Your lactation consultant can help you find a method of pumping that works best for you and give you guidelines about how long it is safe to store breast milk. Caring for your breasts while you breastfeed Nipples can become dry, cracked, and sore while breastfeeding. The following recommendations can help keep your breasts moisturized and healthy:  Avoid using soap on your nipples.  Wear a supportive bra designed especially for nursing. Avoid wearing underwire-style bras or extremely tight bras (sports bras).  Air-dry your nipples for 3-4 minutes after each feeding.  Use only cotton bra pads to absorb leaked breast milk. Leaking of breast milk between feedings is normal.  Use lanolin on your nipples after breastfeeding. Lanolin helps to maintain your skin's normal moisture barrier. Pure lanolin is not harmful (not toxic) to your baby. You may also hand express a few drops of breast milk and gently massage that milk into your nipples and allow the milk to air-dry.  In the first few weeks after giving birth, some women experience breast engorgement. Engorgement can make your breasts feel heavy, warm, and tender to the touch. Engorgement peaks within 3-5 days after you give birth. The following recommendations can help to ease engorgement:  Completely empty your breasts while breastfeeding or pumping. You  may want to start by applying warm, moist heat (in the shower or with warm, water-soaked hand towels) just before feeding or pumping. This increases circulation and helps the milk flow. If your baby does not completely empty your breasts while breastfeeding, pump any extra milk after he or she is finished.  Apply ice packs to your breasts immediately after breastfeeding or pumping, unless this is too uncomfortable for you. To do this: ? Put ice in a plastic bag. ? Place a towel between your skin and the bag. ? Leave the ice on for 20 minutes, 2-3 times a day.  Make sure that your baby is latched on and positioned properly   while breastfeeding.  If engorgement persists after 48 hours of following these recommendations, contact your health care provider or a lactation consultant. Overall health care recommendations while breastfeeding  Eat 3 healthy meals and 3 snacks every day. Well-nourished mothers who are breastfeeding need an additional 450-500 calories a day. You can meet this requirement by increasing the amount of a balanced diet that you eat.  Drink enough water to keep your urine pale yellow or clear.  Rest often, relax, and continue to take your prenatal vitamins to prevent fatigue, stress, and low vitamin and mineral levels in your body (nutrient deficiencies).  Do not use any products that contain nicotine or tobacco, such as cigarettes and e-cigarettes. Your baby may be harmed by chemicals from cigarettes that pass into breast milk and exposure to secondhand smoke. If you need help quitting, ask your health care provider.  Avoid alcohol.  Do not use illegal drugs or marijuana.  Talk with your health care provider before taking any medicines. These include over-the-counter and prescription medicines as well as vitamins and herbal supplements. Some medicines that may be harmful to your baby can pass through breast milk.  It is possible to become pregnant while breastfeeding. If  birth control is desired, ask your health care provider about options that will be safe while breastfeeding your baby. Where to find more information: [employer]: www.llli.org Contact a health care provider if:  You feel like you want to stop breastfeeding or have become frustrated with breastfeeding.  Your nipples are cracked or bleeding.  Your breasts are red, tender, or warm.  You have: ? Painful breasts or nipples. ? A swollen area on either breast. ? A fever or chills. ? Nausea or vomiting. ? Drainage other than breast milk from your nipples.  Your breasts do not become full before feedings by the fifth day after you give birth.  You feel sad and depressed.  Your baby is: ? Too sleepy to eat well. ? Having trouble sleeping. ? More than 1 week old and wetting fewer than 6 diapers in a 24-hour period. ? Not gaining weight by 5 days of age.  Your baby has fewer than 3 stools in a 24-hour period.  Your baby's skin or the white parts of his or her eyes become yellow. Get help right away if:  Your baby is overly tired (lethargic) and does not want to wake up and feed.  Your baby develops an unexplained fever. Summary  Breastfeeding offers many health benefits for infant and mothers.  Try to breastfeed your infant when he or she shows early signs of hunger.  Gently tickle or stroke your baby's lips with your finger or nipple to allow the baby to open his or her mouth. Bring the baby to your breast. Make sure that much of the areola is in your baby's mouth. Offer one side and burp the baby before you offer the other side.  Talk with your health care provider or lactation consultant if you have questions or you face problems as you breastfeed. This information is not intended to replace advice given to you by your health care provider. Make sure you discuss any questions you have with your health care provider. Document Released: 06/21/2005 Document  Revised: 07/23/2016 Document Reviewed: 07/23/2016 Elsevier Interactive Patient Education  2018 Elsevier Inc.  Vaginal Birth After Cesarean Delivery Vaginal birth after cesarean delivery (VBAC) is giving birth vaginally after previously delivering a baby by a cesarean. In the past, if   a woman had a cesarean delivery, all births afterward would be done by cesarean delivery. This is no longer true. It can be safe for the mother to try a vaginal delivery after having a cesarean delivery. It is important to discuss VBAC with your health care provider early in the pregnancy so you can understand the risks, benefits, and options. It will give you time to decide what is best in your particular case. The final decision about whether to have a VBAC or repeat cesarean delivery should be between you and your health care provider. Any changes in your health or your baby's health during your pregnancy may make it necessary to change your initial decision about VBAC. Women who plan to have a VBAC should check with their health care provider to be sure that:  The previous cesarean delivery was done with a low transverse uterine cut (incision) (not a vertical classical incision).  The birth canal is big enough for the baby.  There were no other operations on the uterus.  An electronic fetal monitor (EFM) will be on at all times during labor.  An operating room will be available and ready in case an emergency cesarean delivery is needed.  A health care provider and surgical nursing staff will be available at all times during labor to be ready to do an emergency delivery cesarean if necessary.  An anesthesiologist will be present in case an emergency cesarean delivery is needed.  The nursery is prepared and has adequate personnel and necessary equipment available to care for the baby in case of an emergency cesarean delivery. Benefits of VBAC  Shorter stay in the hospital.  Avoidance of risks associated  with cesarean delivery, such as: ? Surgical complications, such as opening of the incision or hernia in the incision. ? Injury to other organs. ? Fever. This can occur if an infection develops after surgery. It can also occur as a reaction to the medicine given to make you numb during the surgery.  Less blood loss and need for blood transfusions.  Lower risk of blood clots and infection.  Shorter recovery.  Decreased risk for having to remove the uterus (hysterectomy).  Decreased risk for the placenta to completely or partially cover the opening of the uterus (placenta previa) with a future pregnancy.  Decrease risk in future labor and delivery. Risks of a VBAC  Tearing (rupture) of the uterus. This is occurs in less than 1% of VBACs. The risk of this happening is higher if: ? Steps are taken to begin the labor process (induce labor) or stimulate or strengthen contractions (augment labor). ? Medicine is used to soften (ripen) the cervix.  Having to remove the uterus (hysterectomy) if it ruptures. VBAC should not be done if:  The previous cesarean delivery was done with a vertical (classical) or T-shaped incision or you do not know what kind of incision was made.  You had a ruptured uterus.  You have had certain types of surgery on your uterus, such as removal of uterine fibroids. Ask your health care provider about other types of surgeries that prevent you from having a VBAC.  You have certain medical or childbirth (obstetrical) problems.  There are problems with the baby.  You have had two previous cesarean deliveries and no vaginal deliveries. Other facts to know about VBAC:  It is safe to have an epidural anesthetic with VBAC.  It is safe to turn the baby from a breech position (attempt an external cephalic version).    It is safe to try a VBAC with twins.  VBAC may not be successful if your baby weights 8.8 lb (4 kg) or more. However, weight predictions are not always  accurate and should not be used alone to decide if VBAC is right for you.  There is an increased failure rate if the time between the cesarean delivery and VBAC is less than 19 months.  Your health care provider may advise against a VBAC if you have preeclampsia (high blood pressure, protein in the urine, and swelling of face and extremities).  VBAC is often successful if you previously gave birth vaginally.  VBAC is often successful when the labor starts spontaneously before the due date.  Delivering a baby through a VBAC is similar to having a normal spontaneous vaginal delivery. This information is not intended to replace advice given to you by your health care provider. Make sure you discuss any questions you have with your health care provider. Document Released: 12/12/2006 Document Revised: 11/27/2015 Document Reviewed: 01/18/2013 Elsevier Interactive Patient Education  2018 Elsevier Inc.  

## 2017-10-05 ENCOUNTER — Other Ambulatory Visit (HOSPITAL_COMMUNITY): Payer: Self-pay | Admitting: *Deleted

## 2017-10-05 ENCOUNTER — Encounter: Payer: Self-pay | Admitting: Physician Assistant

## 2017-10-06 ENCOUNTER — Ambulatory Visit (HOSPITAL_COMMUNITY)
Admission: RE | Admit: 2017-10-06 | Discharge: 2017-10-06 | Disposition: A | Discharge status: Home / Self Care | Payer: 59 | Source: Ambulatory Visit | Attending: Family Medicine | Admitting: Family Medicine

## 2017-10-06 DIAGNOSIS — D649 Anemia, unspecified: Secondary | ICD-10-CM | POA: Insufficient documentation

## 2017-10-06 MED ORDER — FERUMOXYTOL INJECTION 510 MG/17 ML
510.0000 mg | INTRAVENOUS | Status: DC
  Administered 2017-10-06: 510 mg via INTRAVENOUS
  Filled 2017-10-06: qty 17

## 2017-10-06 NOTE — Discharge Instructions (Signed)

## 2017-10-12 ENCOUNTER — Ambulatory Visit (INDEPENDENT_AMBULATORY_CARE_PROVIDER_SITE_OTHER): Payer: 59

## 2017-10-12 ENCOUNTER — Other Ambulatory Visit (HOSPITAL_COMMUNITY)
Admission: RE | Admit: 2017-10-12 | Discharge: 2017-10-12 | Disposition: A | Discharge status: Home / Self Care | Payer: 59 | Source: Ambulatory Visit

## 2017-10-12 VITALS — Wt 169.4 lb

## 2017-10-12 DIAGNOSIS — Z3A33 33 weeks gestation of pregnancy: Secondary | ICD-10-CM | POA: Diagnosis not present

## 2017-10-12 DIAGNOSIS — O0993 Supervision of high risk pregnancy, unspecified, third trimester: Secondary | ICD-10-CM | POA: Insufficient documentation

## 2017-10-12 DIAGNOSIS — O0990 Supervision of high risk pregnancy, unspecified, unspecified trimester: Secondary | ICD-10-CM | POA: Diagnosis present

## 2017-10-12 DIAGNOSIS — O2233 Deep phlebothrombosis in pregnancy, third trimester: Secondary | ICD-10-CM

## 2017-10-12 DIAGNOSIS — O2230 Deep phlebothrombosis in pregnancy, unspecified trimester: Secondary | ICD-10-CM

## 2017-10-12 NOTE — Progress Notes (Signed)
Pt states had  contractions last night every 10 mins and still having them

## 2017-10-12 NOTE — Patient Instructions (Signed)
AREA PEDIATRIC/FAMILY PRACTICE PHYSICIANS  [facility] [address], [address] [place], [place]  [address] Phone - [phone]   Fax - [phone]  [facility] [address] [place], [place] [address] Phone - [phone]   Fax - [phone]  J[facility] AMOS [address] [place], [place]  [address] Phone - [phone]   Fax - [phone]  [facility] [address] [place], [place]  [address] Phone - [phone]   Fax - [phone]  [facility] [address] [place], [place]  [address] Phone - [phone]   Fax - [phone]  [facility] [address] [place], [place]  [address] Phone - [phone]   Fax - [phone]  [facility] [address] [place], [place]  [address] Phone - [phone]   Fax - [phone]  [facility] [address], [address] [place], [place]  [address] Phone - [phone]   Fax - [phone]  [facility] [address] [place], [place]  [address] Phone - [phone]   Fax - [phone] [facility] [address] [place], [place]  [address] Phone - [phone]   Fax - [phone]  [facility] [address] [place], [place]  [address] Phone - [phone]   Fax - [phone]  [facility] [address] [place], [place]  [address] Phone - [phone]   Fax - [phone]  [facility] [address] [place], [place]  [address] Phone - [phone]   Fax - [phone]  SHILPA GOSRANI [address] [place], [place]  [address] Phone - [phone]  [facility] [address] [place], [place]  [address] Phone - [phone]   Fax - [phone]  [facility] [address] [place], [place]  [address] Phone - [phone]   Fax - [phone]  [facility] [address] [place], [place]  [address] Phone - [phone]   Fax - [phone]  [facility] [address] [place], [place]  [address] Phone - [phone]   Fax - [phone]   [facility] [address] [place], [place]  [address] Phone - [phone]   Fax - [phone]  [facility] [address] [place], [place]  [address] Phone - [phone]   Fax - [phone]  DAVID RUBIN [address] [place], [place]  [address] Phone - [phone]   Fax - [phone]  [facility] [address] , [place]  [address] Phone - [phone]   Fax - [phone]  [facility] [address] , [place]  [address] Phone - [phone]   Fax - [phone] [facility] [address] [place], [place]  [address] Phone - [phone]   Fax - [phone]  [facility] [address] [place], [place]  [address] Phone - [phone]   Fax - [phone]   [facility] [address] [place], [place]  [address] Phone - [phone]   Fax - [phone]  [facility] Charlene Flemming MD [address] [place] [address] Phone [phone]  Fax [phone]   

## 2017-10-12 NOTE — Progress Notes (Signed)
   PRENATAL VISIT NOTE  Subjective:  Tanya Fisher is a 22 y.o. G3P1011 at 68Gerhard Munch71w0d being seen today for ongoing prenatal care.  She is currently monitored for the following issues for this high-risk pregnancy and has High-risk pregnancy; DVT (deep vein thrombosis) in pregnancy (HCC); History of cesarean delivery; Limited prenatal care; and Anemia affecting pregnancy on their problem list.  Patient reports contractions since last night.  Contractions: Irregular. Vag. Bleeding: None.  Movement: Present. Denies leaking of fluid.   The following portions of the patient's history were reviewed and updated as appropriate: allergies, current medications, past family history, past medical history, past social history, past surgical history and problem list. Problem list updated.  Objective:   Vitals:   10/12/17 1054  Weight: 169 lb 6.4 oz (76.8 kg)    Fetal Status: Fetal Heart Rate (bpm): 152 Fundal Height: 33 cm Movement: Present     General:  Alert, oriented and cooperative. Patient is in no acute distress.  Skin: Skin is warm and dry. No rash noted.   Cardiovascular: Normal heart rate noted  Respiratory: Normal respiratory effort, no problems with respiration noted  Abdomen: Soft, gravid, appropriate for gestational age.  Pain/Pressure: Present     Pelvic: Cervical exam performed Dilation: Closed Effacement (%): Thick Station: Ballotable  Extremities: Normal range of motion.  Edema: Trace  Mental Status: Normal mood and affect. Normal behavior. Normal judgment and thought content.   Assessment and Plan:  Pregnancy: G3P1011 at 2671w0d  1. High risk pregnancy, antepartum - Braxton hicks vs. Preterm labor reviewed.  - TOLAC consent signed today - Cervicovaginal ancillary only  2. DVT (deep vein thrombosis) in pregnancy (HCC) - Continue lovenox  Preterm labor symptoms and general obstetric precautions including but not limited to vaginal bleeding, contractions, leaking of fluid and fetal  movement were reviewed in detail with the patient. Please refer to After Visit Summary for other counseling recommendations.  Return in about 2 weeks (around 10/26/2017) for Return OB visit.  Future Appointments  Date Time Provider Department Center  10/13/2017 10:00 AM [facility] ROOM 9 [facility] None    Rolm BookbinderCaroline M Neill, [place]  10/12/17 11:16 AM

## 2017-10-13 ENCOUNTER — Ambulatory Visit (HOSPITAL_COMMUNITY)
Admission: RE | Admit: 2017-10-13 | Discharge: 2017-10-13 | Disposition: A | Discharge status: Home / Self Care | Payer: 59 | Source: Ambulatory Visit | Attending: Family Medicine | Admitting: Family Medicine

## 2017-10-13 DIAGNOSIS — D649 Anemia, unspecified: Secondary | ICD-10-CM | POA: Diagnosis not present

## 2017-10-13 LAB — CERVICOVAGINAL ANCILLARY ONLY
BACTERIAL VAGINITIS: POSITIVE — AB
CANDIDA VAGINITIS: NEGATIVE
CHLAMYDIA, DNA PROBE: NEGATIVE
NEISSERIA GONORRHEA: NEGATIVE
Trichomonas: NEGATIVE

## 2017-10-13 MED ORDER — SODIUM CHLORIDE 0.9 % IV SOLN
510.0000 mg | INTRAVENOUS | Status: DC
  Administered 2017-10-13: 510 mg via INTRAVENOUS
  Filled 2017-10-13: qty 17

## 2017-11-01 ENCOUNTER — Encounter: Payer: Self-pay | Admitting: Medical

## 2017-11-01 ENCOUNTER — Ambulatory Visit (INDEPENDENT_AMBULATORY_CARE_PROVIDER_SITE_OTHER): Payer: 59 | Admitting: Medical

## 2017-11-01 VITALS — BP 112/65 | HR 85 | Wt 169.0 lb

## 2017-11-01 DIAGNOSIS — N760 Acute vaginitis: Secondary | ICD-10-CM

## 2017-11-01 DIAGNOSIS — O0990 Supervision of high risk pregnancy, unspecified, unspecified trimester: Secondary | ICD-10-CM

## 2017-11-01 DIAGNOSIS — I82409 Acute embolism and thrombosis of unspecified deep veins of unspecified lower extremity: Secondary | ICD-10-CM

## 2017-11-01 DIAGNOSIS — O2230 Deep phlebothrombosis in pregnancy, unspecified trimester: Secondary | ICD-10-CM

## 2017-11-01 DIAGNOSIS — B9689 Other specified bacterial agents as the cause of diseases classified elsewhere: Secondary | ICD-10-CM

## 2017-11-01 DIAGNOSIS — Z98891 History of uterine scar from previous surgery: Secondary | ICD-10-CM

## 2017-11-01 MED ORDER — METRONIDAZOLE 500 MG PO TABS: 500.0000 mg | ORAL_TABLET | Freq: Two times a day (BID) | ORAL | 0 refills | Status: DC

## 2017-11-01 NOTE — Progress Notes (Signed)
   PRENATAL VISIT NOTE  Subjective:  Tanya Fisher is a 22 y.o. G3P1011 at [redacted]w[redacted]d being seen today for ongoing prenatal care.  She is currently monitored for the following issues for this high-risk pregnancy and has High-risk pregnancy; DVT (deep vein thrombosis) in pregnancy (HCC); History of cesarean delivery; Limited prenatal care; and Anemia affecting pregnancy on their problem list.  Patient reports occasional contractions.  Contractions: Irritability. Vag. Bleeding: None.  Movement: Present. Denies leaking of fluid.   The following portions of the patient's history were reviewed and updated as appropriate: allergies, current medications, past family history, past medical history, past social history, past surgical history and problem list. Problem list updated.  Objective:   Vitals:   11/01/17 0951  BP: 112/65  Pulse: 85  Weight: 169 lb (76.7 kg)    Fetal Status: Fetal Heart Rate (bpm): 132 Fundal Height: 34 cm Movement: Present     General:  Alert, oriented and cooperative. Patient is in no acute distress.  Skin: Skin is warm and dry. No rash noted.   Cardiovascular: Normal heart rate noted  Respiratory: Normal respiratory effort, no problems with respiration noted  Abdomen: Soft, gravid, appropriate for gestational age.  Pain/Pressure: Present     Pelvic: Cervical exam performed Dilation: 1 Effacement (%): 50 Station: -3  Extremities: Normal range of motion.  Edema: None  Mental Status: Normal mood and affect. Normal behavior. Normal judgment and thought content.   Assessment and Plan:  Pregnancy: G3P1011 at [redacted]w[redacted]d  1. High risk pregnancy, antepartum - Culture, beta strep (group b only) - GC/Chlamydia negative recently, did not repeat today   2. DVT (deep vein thrombosis) in pregnancy (HCC) - on Lovenox, no concerns, will see MD at next visit to discuss delivery plan   3. History of cesarean delivery - TOLAC signed 10/12/17  4. BV (bacterial vaginosis) -  metroNIDAZOLE (FLAGYL) 500 MG tablet; Take 1 tablet (500 mg total) by mouth 2 (two) times daily.  Dispense: 14 tablet; Refill: 0  Preterm labor symptoms and general obstetric precautions including but not limited to vaginal bleeding, contractions, leaking of fluid and fetal movement were reviewed in detail with the patient. Please refer to After Visit Summary for other counseling recommendations.  Return in about 1 week (around 11/08/2017) for [facility] with MD.   Vonzella Nipple, PA-C

## 2017-11-01 NOTE — Patient Instructions (Signed)

## 2017-11-04 LAB — CULTURE, BETA STREP (GROUP B ONLY): Strep Gp B Culture: POSITIVE — AB

## 2017-11-04 LAB — OB RESULTS CONSOLE GBS: GBS: POSITIVE

## 2017-11-05 ENCOUNTER — Inpatient Hospital Stay (HOSPITAL_COMMUNITY)
Admission: AD | Admit: 2017-11-05 | Discharge: 2017-11-05 | Disposition: A | Discharge status: Home / Self Care | Payer: 59 | Source: Ambulatory Visit | Attending: Obstetrics & Gynecology | Admitting: Obstetrics & Gynecology

## 2017-11-05 ENCOUNTER — Encounter (HOSPITAL_COMMUNITY): Payer: Self-pay | Admitting: *Deleted

## 2017-11-05 DIAGNOSIS — O471 False labor at or after 37 completed weeks of gestation: Secondary | ICD-10-CM | POA: Diagnosis present

## 2017-11-05 DIAGNOSIS — Z3A37 37 weeks gestation of pregnancy: Secondary | ICD-10-CM | POA: Diagnosis not present

## 2017-11-05 DIAGNOSIS — O479 False labor, unspecified: Secondary | ICD-10-CM

## 2017-11-05 NOTE — MAU Note (Signed)
PT  SAYS  [facility]  STRONG  SINCE 11PM.   PREV  C/S-  THIS  TOLAC.   DENIES  HSV.   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     GBS- POSITIVE.   [facility]  WITH  CLINIC

## 2017-11-05 NOTE — Discharge Instructions (Signed)
Braxton Hicks Contractions °Contractions of the uterus can occur throughout pregnancy, but they are not always a sign that you are in labor. You may have practice contractions called Braxton Hicks contractions. These false labor contractions are sometimes confused with true labor. °What are Braxton Hicks contractions? °Braxton Hicks contractions are tightening movements that occur in the muscles of the uterus before labor. Unlike true labor contractions, these contractions do not result in opening (dilation) and thinning of the cervix. Toward the end of pregnancy (32-34 weeks), Braxton Hicks contractions can happen more often and may become stronger. These contractions are sometimes difficult to tell apart from true labor because they can be very uncomfortable. You should not feel embarrassed if you go to the hospital with false labor. °Sometimes, the only way to tell if you are in true labor is for your health care provider to look for changes in the cervix. The health care provider will do a physical exam and may monitor your contractions. If you are not in true labor, the exam should show that your cervix is not dilating and your water has not broken. °If there are other health problems associated with your pregnancy, it is completely safe for you to be sent home with false labor. You may continue to have Braxton Hicks contractions until you go into true labor. °How to tell the difference between true labor and false labor °True labor °· Contractions last 30-70 seconds. °· Contractions become very regular. °· Discomfort is usually felt in the top of the uterus, and it spreads to the lower abdomen and low back. °· Contractions do not go away with walking. °· Contractions usually become more intense and increase in frequency. °· The cervix dilates and gets thinner. °False labor °· Contractions are usually shorter and not as strong as true labor contractions. °· Contractions are usually irregular. °· Contractions  are often felt in the front of the lower abdomen and in the groin. °· Contractions may go away when you walk around or change positions while lying down. °· Contractions get weaker and are shorter-lasting as time goes on. °· The cervix usually does not dilate or become thin. °Follow these instructions at home: °· Take over-the-counter and prescription medicines only as told by your health care provider. °· Keep up with your usual exercises and follow other instructions from your health care provider. °· Eat and drink lightly if you think you are going into labor. °· If Braxton Hicks contractions are making you uncomfortable: °? Change your position from lying down or resting to walking, or change from walking to resting. °? Sit and rest in a tub of warm water. °? Drink enough fluid to keep your urine pale yellow. Dehydration may cause these contractions. °? Do slow and deep breathing several times an hour. °· Keep all follow-up prenatal visits as told by your health care provider. This is important. °Contact a health care provider if: °· You have a fever. °· You have continuous pain in your abdomen. °Get help right away if: °· Your contractions become stronger, more regular, and closer together. °· You have fluid leaking or gushing from your vagina. °· You pass blood-tinged mucus (bloody show). °· You have bleeding from your vagina. °· You have low back pain that you never had before. °· You feel your baby’s head pushing down and causing pelvic pressure. °· Your baby is not moving inside you as much as it used to. °Summary °· Contractions that occur before labor are called Braxton   Hicks contractions, false labor, or practice contractions. °· Braxton Hicks contractions are usually shorter, weaker, farther apart, and less regular than true labor contractions. True labor contractions usually become progressively stronger and regular and they become more frequent. °· Manage discomfort from Braxton Hicks contractions by  changing position, resting in a warm bath, drinking plenty of water, or practicing deep breathing. °This information is not intended to replace advice given to you by your health care provider. Make sure you discuss any questions you have with your health care provider. °Document Released: 11/04/2016 Document Revised: 11/04/2016 Document Reviewed: 11/04/2016 °Elsevier Interactive Patient Education © 2018 Elsevier Inc. ° °

## 2017-11-05 NOTE — MAU Provider Note (Signed)
Patient presents for labor evaluation at term without other complaint.   NST reviewed  Fetal Monitoring: Baseline: 130 bpm Variability: moderate Accelerations: 15 x 15 Decelerations: none Contractions: few, irregular   A:  False labor  P:  Discharge home   Kathlene Cote 11/05/2017 1:48 AM

## 2017-11-14 ENCOUNTER — Encounter: Payer: 59 | Admitting: Obstetrics & Gynecology

## 2017-11-15 ENCOUNTER — Telehealth: Payer: Self-pay | Admitting: General Practice

## 2017-11-15 NOTE — Telephone Encounter (Signed)
Patient missed OB appointment on 11/14/17.  Called patient to remind her of her future appointments.  Advised patient to visit MAU if she has any complications.  Patient voiced understanding.

## 2017-11-21 ENCOUNTER — Ambulatory Visit (INDEPENDENT_AMBULATORY_CARE_PROVIDER_SITE_OTHER): Payer: 59 | Admitting: Family Medicine

## 2017-11-21 VITALS — BP 106/71 | HR 92 | Wt 174.0 lb

## 2017-11-21 DIAGNOSIS — O2230 Deep phlebothrombosis in pregnancy, unspecified trimester: Secondary | ICD-10-CM

## 2017-11-21 DIAGNOSIS — I82409 Acute embolism and thrombosis of unspecified deep veins of unspecified lower extremity: Secondary | ICD-10-CM

## 2017-11-21 DIAGNOSIS — O2233 Deep phlebothrombosis in pregnancy, third trimester: Secondary | ICD-10-CM

## 2017-11-21 NOTE — Patient Instructions (Signed)

## 2017-11-21 NOTE — Progress Notes (Signed)
   PRENATAL VISIT NOTE  Subjective:  Tanya Fisher is a 22 y.o. G3P1011 at [redacted]w[redacted]d being seen today for ongoing prenatal care.  She is currently monitored for the following issues for this low-risk pregnancy and has High-risk pregnancy; DVT (deep vein thrombosis) in pregnancy (HCC); History of cesarean delivery; Limited prenatal care; and Anemia affecting pregnancy on their problem list.  Patient reports no complaints.  Contractions: Irregular. Vag. Bleeding: None.  Movement: Present. Denies leaking of fluid.   The following portions of the patient's history were reviewed and updated as appropriate: allergies, current medications, past family history, past medical history, past social history, past surgical history and problem list. Problem list updated.  Objective:   Vitals:   11/21/17 0921  BP: 106/71  Pulse: 92  Weight: 174 lb (78.9 kg)    Fetal Status: Fetal Heart Rate (bpm): 140 Fundal Height: 33 cm Movement: Present  Presentation: Vertex  General:  Alert, oriented and cooperative. Patient is in no acute distress.  Skin: Skin is warm and dry. No rash noted.   Cardiovascular: Normal heart rate noted  Respiratory: Normal respiratory effort, no problems with respiration noted  Abdomen: Soft, gravid, appropriate for gestational age.  Pain/Pressure: Present     Pelvic: Cervical exam performed Dilation: 1 Effacement (%): 80 Station: -2  Extremities: Normal range of motion.  Edema: None  Mental Status: Normal mood and affect. Normal behavior. Normal judgment and thought content.   Assessment and Plan:  Pregnancy: G3P1011 at [redacted]w[redacted]d  1. DVT (deep vein thrombosis) in pregnancy (HCC) Continue Lovenox--does not want regional analgesia  2. Supervision of High risk pregnancy  Term labor symptoms and general obstetric precautions including but not limited to vaginal bleeding, contractions, leaking of fluid and fetal movement were reviewed in detail with the patient. Please refer to After  Visit Summary for other counseling recommendations.  Return in 1 week (on 11/28/2017).  Future Appointments  Date Time Provider Department Center  11/29/2017  1:35 PM Pincus Large, DO [facility]    Reva Bores, MD

## 2017-11-29 ENCOUNTER — Encounter (HOSPITAL_COMMUNITY): Payer: Self-pay | Admitting: *Deleted

## 2017-11-29 ENCOUNTER — Ambulatory Visit (INDEPENDENT_AMBULATORY_CARE_PROVIDER_SITE_OTHER): Payer: 59 | Admitting: Obstetrics and Gynecology

## 2017-11-29 ENCOUNTER — Telehealth (HOSPITAL_COMMUNITY): Payer: Self-pay | Admitting: *Deleted

## 2017-11-29 VITALS — BP 115/69 | HR 97 | Wt 177.0 lb

## 2017-11-29 DIAGNOSIS — Z98891 History of uterine scar from previous surgery: Secondary | ICD-10-CM

## 2017-11-29 DIAGNOSIS — O99013 Anemia complicating pregnancy, third trimester: Secondary | ICD-10-CM

## 2017-11-29 DIAGNOSIS — O2230 Deep phlebothrombosis in pregnancy, unspecified trimester: Secondary | ICD-10-CM

## 2017-11-29 DIAGNOSIS — O99820 Streptococcus B carrier state complicating pregnancy: Secondary | ICD-10-CM

## 2017-11-29 DIAGNOSIS — O0993 Supervision of high risk pregnancy, unspecified, third trimester: Secondary | ICD-10-CM

## 2017-11-29 DIAGNOSIS — I82409 Acute embolism and thrombosis of unspecified deep veins of unspecified lower extremity: Secondary | ICD-10-CM

## 2017-11-29 NOTE — Progress Notes (Signed)
Subjective:  Tanya Fisher is a 22 y.o. G3P1011 at [redacted]w[redacted]d being seen today for ongoing prenatal care.  She is currently monitored for the following issues for this high-risk pregnancy and has High-risk pregnancy; DVT (deep vein thrombosis) in pregnancy (HCC); History of cesarean delivery; Limited prenatal care; Anemia affecting pregnancy; and Group B Streptococcus carrier state affecting pregnancy on their problem list.  Patient reports no complaints.  Contractions: Regular. Vag. Bleeding: None.  Movement: Present. Denies leaking of fluid.   The following portions of the patient's history were reviewed and updated as appropriate: allergies, current medications, past family history, past medical history, past social history, past surgical history and problem list. Problem list updated.  Objective:   Vitals:   11/29/17 1347  BP: 115/69  Pulse: 97  Weight: 177 lb (80.3 kg)    Fetal Status: Fetal Heart Rate (bpm): 150   Movement: Present     General:  Alert, oriented and cooperative. Patient is in no acute distress.  Skin: Skin is warm and dry. No rash noted.   Cardiovascular: Normal heart rate noted  Respiratory: Normal respiratory effort, no problems with respiration noted  Abdomen: Soft, gravid, appropriate for gestational age. Pain/Pressure: Present     Pelvic: Vag. Bleeding: None Vag D/C Character: Mucous   Cervical exam performed        Extremities: Normal range of motion.  Edema: None  Mental Status: Normal mood and affect. Normal behavior. Normal judgment and thought content.   Urinalysis:      Assessment and Plan:  Pregnancy: G3P1011 at [redacted]w[redacted]d  1. High-risk pregnancy in third trimester Doing well. Having Braxton-Hicks contractions. IOL scheduled; orders placed. Will need NST/BPP if not delivered by next visit for postdates.  2. DVT (deep vein thrombosis) in pregnancy (HCC) On Lovenox. Instructed to discontinue 24hrs prior to induction. Will need to continue postpartum.    3. History of cesarean delivery Desires TOLAC. Consent in chart.  4. Group B Streptococcus carrier state affecting pregnancy Will need treatment in labor.  5. Anemia affecting pregnancy in third trimester Last Hbg 8.0. On PO iron.   Term labor symptoms and general obstetric precautions including but not limited to vaginal bleeding, contractions, leaking of fluid and fetal movement were reviewed in detail with the patient. Please refer to After Visit Summary for other counseling recommendations.  Return in about 1 week (around 12/06/2017) for ob visit.   Pincus Large, DO

## 2017-11-29 NOTE — Telephone Encounter (Signed)
Preadmission screen  

## 2017-11-29 NOTE — Patient Instructions (Signed)
Labor Induction Labor induction is when steps are taken to cause a pregnant woman to begin the labor process. Most women go into labor on their own between 37 weeks and 42 weeks of the pregnancy. When this does not happen or when there is a medical need, methods may be used to induce labor. Labor induction causes a pregnant woman's uterus to contract. It also causes the cervix to soften (ripen), open (dilate), and thin out (efface). Usually, labor is not induced before 39 weeks of the pregnancy unless there is a problem with the baby or mother. Before inducing labor, your health care provider will consider a number of factors, including the following:  The medical condition of you and the baby.  How many weeks along you are.  The status of the baby's lung maturity.  The condition of the cervix.  The position of the baby. What are the reasons for labor induction? Labor may be induced for the following reasons:  The health of the baby or mother is at risk.  The pregnancy is overdue by 1 week or more.  The water breaks but labor does not start on its own.  The mother has a health condition or serious illness, such as high blood pressure, infection, placental abruption, or diabetes.  The amniotic fluid amounts are low around the baby.  The baby is distressed. Convenience or wanting the baby to be born on a certain date is not a reason for inducing labor. What methods are used for labor induction? Several methods of labor induction may be used, such as:  Prostaglandin medicine. This medicine causes the cervix to dilate and ripen. The medicine will also start contractions. It can be taken by mouth or by inserting a suppository into the vagina.  Inserting a thin tube (catheter) with a balloon on the end into the vagina to dilate the cervix. Once inserted, the balloon is expanded with water, which causes the cervix to open.  Stripping the membranes. Your health care provider separates  amniotic sac tissue from the cervix, causing the cervix to be stretched and causing the release of a hormone called progesterone. This may cause the uterus to contract. It is often done during an office visit. You will be sent home to wait for the contractions to begin. You will then come in for an induction.  Breaking the water. Your health care provider makes a hole in the amniotic sac using a small instrument. Once the amniotic sac breaks, contractions should begin. This may still take hours to see an effect.  Medicine to trigger or strengthen contractions. This medicine is given through an IV access tube inserted into a vein in your arm. All of the methods of induction, besides stripping the membranes, will be done in the hospital. Induction is done in the hospital so that you and the baby can be carefully monitored. How long does it take for labor to be induced? Some inductions can take up to 2-3 days. Depending on the cervix, it usually takes less time. It takes longer when you are induced early in the pregnancy or if this is your first pregnancy. If a mother is still pregnant and the induction has been going on for 2-3 days, either the mother will be sent home or a cesarean delivery will be needed. What are the risks associated with labor induction? Some of the risks of induction include:  Changes in fetal heart rate, such as too high, too low, or erratic.  Fetal distress.    Chance of infection for the mother and baby.  Increased chance of having a cesarean delivery.  Breaking off (abruption) of the placenta from the uterus (rare).  Uterine rupture (very rare). When induction is needed for medical reasons, the benefits of induction may outweigh the risks. What are some reasons for not inducing labor? Labor induction should not be done if:  It is shown that your baby does not tolerate labor.  You have had previous surgeries on your uterus, such as a myomectomy or the removal of  fibroids.  Your placenta lies very low in the uterus and blocks the opening of the cervix (placenta previa).  Your baby is not in a head-down position.  The umbilical cord drops down into the birth canal in front of the baby. This could cut off the baby's blood and oxygen supply.  You have had a previous cesarean delivery.  There are unusual circumstances, such as the baby being extremely premature. This information is not intended to replace advice given to you by your health care provider. Make sure you discuss any questions you have with your health care provider. Document Released: 11/10/2006 Document Revised: 11/27/2015 Document Reviewed: 01/18/2013 Elsevier Interactive Patient Education  2017 Elsevier Inc.  

## 2017-12-03 ENCOUNTER — Inpatient Hospital Stay (HOSPITAL_COMMUNITY)
Admission: AD | Admit: 2017-12-03 | Discharge: 2017-12-04 | DRG: 805 | Disposition: A | Discharge status: Home / Self Care | Payer: 59 | Attending: Obstetrics and Gynecology | Admitting: Obstetrics and Gynecology

## 2017-12-03 ENCOUNTER — Encounter (HOSPITAL_COMMUNITY): Payer: Self-pay | Admitting: *Deleted

## 2017-12-03 DIAGNOSIS — Z87891 Personal history of nicotine dependence: Secondary | ICD-10-CM | POA: Diagnosis not present

## 2017-12-03 DIAGNOSIS — O34219 Maternal care for unspecified type scar from previous cesarean delivery: Secondary | ICD-10-CM | POA: Diagnosis present

## 2017-12-03 DIAGNOSIS — Z3A40 40 weeks gestation of pregnancy: Secondary | ICD-10-CM

## 2017-12-03 DIAGNOSIS — O99824 Streptococcus B carrier state complicating childbirth: Secondary | ICD-10-CM | POA: Diagnosis present

## 2017-12-03 DIAGNOSIS — O871 Deep phlebothrombosis in the puerperium: Secondary | ICD-10-CM | POA: Diagnosis present

## 2017-12-03 DIAGNOSIS — O480 Post-term pregnancy: Principal | ICD-10-CM | POA: Diagnosis present

## 2017-12-03 DIAGNOSIS — O2230 Deep phlebothrombosis in pregnancy, unspecified trimester: Secondary | ICD-10-CM

## 2017-12-03 DIAGNOSIS — O99820 Streptococcus B carrier state complicating pregnancy: Secondary | ICD-10-CM

## 2017-12-03 DIAGNOSIS — Z7901 Long term (current) use of anticoagulants: Secondary | ICD-10-CM | POA: Diagnosis not present

## 2017-12-03 DIAGNOSIS — O34211 Maternal care for low transverse scar from previous cesarean delivery: Secondary | ICD-10-CM

## 2017-12-03 LAB — ABO/RH: ABO/RH(D): A POS

## 2017-12-03 LAB — CBC
HEMATOCRIT: 36.7 % (ref 36.0–46.0)
HEMOGLOBIN: 11.9 g/dL — AB (ref 12.0–15.0)
MCH: 29.7 pg (ref 26.0–34.0)
MCHC: 32.4 g/dL (ref 30.0–36.0)
MCV: 91.5 fL (ref 78.0–100.0)
Platelets: 208 10*3/uL (ref 150–400)
RBC: 4.01 MIL/uL (ref 3.87–5.11)
RDW: 19.9 % — AB (ref 11.5–15.5)
WBC: 5.3 10*3/uL (ref 4.0–10.5)

## 2017-12-03 LAB — TYPE AND SCREEN
ABO/RH(D): A POS
ANTIBODY SCREEN: NEGATIVE

## 2017-12-03 LAB — RPR: RPR Ser Ql: NONREACTIVE

## 2017-12-03 MED ORDER — OXYTOCIN 40 UNITS IN LACTATED RINGERS INFUSION - SIMPLE MED
2.5000 [IU]/h | INTRAVENOUS | Status: DC
  Administered 2017-12-03: 2.5 [IU]/h via INTRAVENOUS
  Filled 2017-12-03: qty 1000

## 2017-12-03 MED ORDER — LACTATED RINGERS IV SOLN
500.0000 mL | INTRAVENOUS | Status: DC | PRN
  Administered 2017-12-03: 1000 mL via INTRAVENOUS

## 2017-12-03 MED ORDER — EPHEDRINE 5 MG/ML INJ
10.0000 mg | INTRAVENOUS | Status: DC | PRN
  Filled 2017-12-03: qty 2

## 2017-12-03 MED ORDER — LACTATED RINGERS IV SOLN: 500.0000 mL | Freq: Once | INTRAVENOUS | Status: DC

## 2017-12-03 MED ORDER — BENZOCAINE-MENTHOL 20-0.5 % EX AERO
1.0000 "application " | INHALATION_SPRAY | CUTANEOUS | Status: DC | PRN
  Administered 2017-12-03: 1 via TOPICAL
  Filled 2017-12-03 (×2): qty 56

## 2017-12-03 MED ORDER — ACETAMINOPHEN 325 MG PO TABS
650.0000 mg | ORAL_TABLET | ORAL | Status: DC | PRN
  Administered 2017-12-03 – 2017-12-04 (×2): 650 mg via ORAL
  Filled 2017-12-03: qty 2

## 2017-12-03 MED ORDER — PHENYLEPHRINE 40 MCG/ML (10ML) SYRINGE FOR IV PUSH (FOR BLOOD PRESSURE SUPPORT)
80.0000 ug | PREFILLED_SYRINGE | INTRAVENOUS | Status: DC | PRN
  Filled 2017-12-03: qty 5

## 2017-12-03 MED ORDER — SODIUM CHLORIDE 0.9 % IV SOLN
5.0000 10*6.[IU] | Freq: Once | INTRAVENOUS | Status: AC
  Administered 2017-12-03: 5 10*6.[IU] via INTRAVENOUS
  Filled 2017-12-03: qty 5

## 2017-12-03 MED ORDER — LIDOCAINE HCL (PF) 1 % IJ SOLN
30.0000 mL | INTRAMUSCULAR | Status: DC | PRN
  Administered 2017-12-03: 30 mL via SUBCUTANEOUS
  Filled 2017-12-03 (×2): qty 30

## 2017-12-03 MED ORDER — ONDANSETRON HCL 4 MG PO TABS: 4.0000 mg | ORAL_TABLET | ORAL | Status: DC | PRN

## 2017-12-03 MED ORDER — OXYCODONE-ACETAMINOPHEN 5-325 MG PO TABS: 2.0000 | ORAL_TABLET | ORAL | Status: DC | PRN

## 2017-12-03 MED ORDER — ONDANSETRON HCL 4 MG/2ML IJ SOLN: 4.0000 mg | Freq: Four times a day (QID) | INTRAMUSCULAR | Status: DC | PRN

## 2017-12-03 MED ORDER — FENTANYL 2.5 MCG/ML BUPIVACAINE 1/10 % EPIDURAL INFUSION (WH - ANES): 14.0000 mL/h | INTRAMUSCULAR | Status: DC | PRN

## 2017-12-03 MED ORDER — COCONUT OIL OIL
1.0000 "application " | TOPICAL_OIL | Status: DC | PRN
  Administered 2017-12-04: 1 via TOPICAL
  Filled 2017-12-03 (×2): qty 120

## 2017-12-03 MED ORDER — PENICILLIN G POT IN DEXTROSE 60000 UNIT/ML IV SOLN
3.0000 10*6.[IU] | INTRAVENOUS | Status: DC
  Filled 2017-12-03 (×4): qty 50

## 2017-12-03 MED ORDER — LACTATED RINGERS IV SOLN: INTRAVENOUS | Status: DC

## 2017-12-03 MED ORDER — DIPHENHYDRAMINE HCL 25 MG PO CAPS
25.0000 mg | ORAL_CAPSULE | Freq: Four times a day (QID) | ORAL | Status: DC | PRN
Start: 2017-12-03 — End: 2017-12-04

## 2017-12-03 MED ORDER — ONDANSETRON HCL 4 MG/2ML IJ SOLN: 4.0000 mg | INTRAMUSCULAR | Status: DC | PRN

## 2017-12-03 MED ORDER — DIPHENHYDRAMINE HCL 50 MG/ML IJ SOLN: 12.5000 mg | INTRAMUSCULAR | Status: DC | PRN

## 2017-12-03 MED ORDER — SIMETHICONE 80 MG PO CHEW: 80.0000 mg | CHEWABLE_TABLET | ORAL | Status: DC | PRN

## 2017-12-03 MED ORDER — OXYTOCIN BOLUS FROM INFUSION
500.0000 mL | Freq: Once | INTRAVENOUS | Status: AC
  Administered 2017-12-03: 500 mL via INTRAVENOUS

## 2017-12-03 MED ORDER — FENTANYL CITRATE (PF) 100 MCG/2ML IJ SOLN
100.0000 ug | INTRAMUSCULAR | Status: DC | PRN
Start: 2017-12-03 — End: 2017-12-03

## 2017-12-03 MED ORDER — FLEET ENEMA 7-19 GM/118ML RE ENEM: 1.0000 | ENEMA | RECTAL | Status: DC | PRN

## 2017-12-03 MED ORDER — ACETAMINOPHEN 325 MG PO TABS
650.0000 mg | ORAL_TABLET | ORAL | Status: DC | PRN
  Administered 2017-12-03 – 2017-12-04 (×2): 650 mg via ORAL
  Filled 2017-12-03 (×3): qty 2

## 2017-12-03 MED ORDER — SOD CITRATE-CITRIC ACID 500-334 MG/5ML PO SOLN: 30.0000 mL | ORAL | Status: DC | PRN

## 2017-12-03 MED ORDER — IBUPROFEN 600 MG PO TABS
600.0000 mg | ORAL_TABLET | Freq: Four times a day (QID) | ORAL | Status: DC
  Administered 2017-12-03 – 2017-12-04 (×5): 600 mg via ORAL
  Filled 2017-12-03 (×5): qty 1

## 2017-12-03 MED ORDER — ZOLPIDEM TARTRATE 5 MG PO TABS: 5.0000 mg | ORAL_TABLET | Freq: Every evening | ORAL | Status: DC | PRN

## 2017-12-03 MED ORDER — OXYCODONE HCL 5 MG PO TABS
5.0000 mg | ORAL_TABLET | ORAL | Status: DC | PRN
  Administered 2017-12-03 – 2017-12-04 (×3): 5 mg via ORAL
  Filled 2017-12-03 (×3): qty 1

## 2017-12-03 MED ORDER — MISOPROSTOL 200 MCG PO TABS
800.0000 ug | ORAL_TABLET | Freq: Once | ORAL | Status: AC
  Administered 2017-12-03: 800 ug via RECTAL

## 2017-12-03 MED ORDER — PNEUMOCOCCAL VAC POLYVALENT 25 MCG/0.5ML IJ INJ
0.5000 mL | INJECTION | INTRAMUSCULAR | Status: DC
  Filled 2017-12-03: qty 0.5

## 2017-12-03 MED ORDER — WITCH HAZEL-GLYCERIN EX PADS: 1.0000 "application " | MEDICATED_PAD | CUTANEOUS | Status: DC | PRN

## 2017-12-03 MED ORDER — OXYCODONE-ACETAMINOPHEN 5-325 MG PO TABS: 1.0000 | ORAL_TABLET | ORAL | Status: DC | PRN

## 2017-12-03 MED ORDER — ENOXAPARIN SODIUM 80 MG/0.8ML ~~LOC~~ SOLN
1.0000 mg/kg | Freq: Two times a day (BID) | SUBCUTANEOUS | Status: DC
  Administered 2017-12-03 – 2017-12-04 (×2): 80 mg via SUBCUTANEOUS
  Filled 2017-12-03 (×4): qty 0.8

## 2017-12-03 MED ORDER — TETANUS-DIPHTH-ACELL PERTUSSIS 5-2.5-18.5 LF-MCG/0.5 IM SUSP: 0.5000 mL | Freq: Once | INTRAMUSCULAR | Status: DC

## 2017-12-03 MED ORDER — DIBUCAINE 1 % RE OINT
1.0000 | TOPICAL_OINTMENT | RECTAL | Status: DC | PRN
Start: 2017-12-03 — End: 2017-12-04
  Filled 2017-12-03: qty 28

## 2017-12-03 MED ORDER — MISOPROSTOL 200 MCG PO TABS
ORAL_TABLET | ORAL | Status: AC
  Filled 2017-12-03: qty 4

## 2017-12-03 MED ORDER — SENNOSIDES-DOCUSATE SODIUM 8.6-50 MG PO TABS
2.0000 | ORAL_TABLET | ORAL | Status: DC
  Administered 2017-12-04: 2 via ORAL
  Filled 2017-12-03: qty 2

## 2017-12-03 MED ORDER — PRENATAL MULTIVITAMIN CH
1.0000 | ORAL_TABLET | Freq: Every day | ORAL | Status: DC
  Administered 2017-12-03 – 2017-12-04 (×2): 1 via ORAL
  Filled 2017-12-03 (×2): qty 1

## 2017-12-03 NOTE — MAU Note (Signed)
Awoke at 0300 with ctxs. Some bloody show. Denies LOF. Dx with DVT L Leg in Nov 2018 and on lovenox. Last dose 3 days ago

## 2017-12-03 NOTE — H&P (Addendum)
LABOR ADMISSION HISTORY AND PHYSICAL  Gerhard MunchBrittany T Fisher is a 22 y.o. female G3P1011 with IUP at 9621w3d by 9 wk US presenting for SOL. She reports +FMs, No LOF, no VB, no blurry vision, headaches or peripheral edema, and RUQ pain.  She plans on *Breast feeding. She request diaphram for birth control.  Prenatal History/Complications: [facility] Patient Active Problem List   Diagnosis Date Noted  . Post-dates pregnancy 12/03/2017  . Group B Streptococcus carrier state affecting pregnancy 11/29/2017  . Limited prenatal care 09/27/2017  . Anemia affecting pregnancy 09/27/2017  . High-risk pregnancy 04/29/2017  . DVT (deep vein thrombosis) in pregnancy (HCC) 04/29/2017  . History of cesarean delivery 04/29/2017   Past Medical History: Past Medical History:  Diagnosis Date  . Asthma   . DVT (deep vein thrombosis) in pregnancy [facility](HCC)     Past Surgical History: Past Surgical History:  Procedure Laterality Date  . CESAREAN SECTION    . DILATION AND EVACUATION     Therapeutic abortion 14 wks    Obstetrical History: OB History    Gravida  3   Para  1   Term  1   Preterm  0   AB  1   Living  1     SAB  0   TAB  1   Ectopic  0   Multiple  0   Live Births  1           Social History: Social History   Socioeconomic History  . Marital status: Single    Spouse name: Not on file  . Number of children: Not on file  . Years of education: Not on file  . Highest education level: Not on file  Occupational History  . Not on file  Social Needs  . Financial resource strain: Not on file  . Food insecurity:    Worry: Not on file    Inability: Not on file  . Transportation needs:    Medical: Not on file    Non-medical: Not on file  Tobacco Use  . Smoking status: Former Smoker    Last attempt to quit: 07/06/2015    Years since quitting: 2.4  . Smokeless tobacco: Never Used  Substance and Sexual Activity  . Alcohol use: No    Alcohol/week: 0.0 oz  .  Drug use: No  . Sexual activity: Yes    Partners: Male    Birth control/protection: None  Lifestyle  . Physical activity:    Days per week: Not on file    Minutes per session: Not on file  . Stress: Not on file  Relationships  . Social connections:    Talks on phone: Not on file    Gets together: Not on file    Attends religious service: Not on file    Active member of club or organization: Not on file    Attends meetings of clubs or organizations: Not on file    Relationship status: Not on file  Other Topics Concern  . Not on file  Social History Narrative  . Not on file    Family History: Family History  Problem Relation Age of Onset  . Hypertension Mother   . Diabetes Father   . Hypertension Father     Allergies: No Known Allergies  Medications Prior to Admission  Medication Sig Dispense Refill Last Dose  . enoxaparin (LOVENOX) 80 MG/0.8ML injection Inject 0.8 mLs (80 mg total) into the skin every 12 (twelve)  hours. 60 Syringe 11 Taking  . ferrous sulfate 325 (65 FE) MG tablet Take 1 tablet (325 mg total) by mouth 2 (two) times daily with a meal. 60 tablet 2 Taking  . Prenatal Vit-Fe Fumarate-FA (PREPLUS) 27-1 MG TABS Take 1 tablet by mouth daily.   Taking    Review of Systems   All systems reviewed and negative except as stated in HPI  PE: Blood pressure 116/71, pulse 76, temperature 97.7 F (36.5 C), temperature source Oral, resp. rate 20, height 5\' 4"  (1.626 m), weight 178 lb (80.7 kg), last menstrual period 02/26/2017, currently breastfeeding. General appearance: alert, cooperative and no distress HEENT: no JVD, MMM Abdomen: soft, non-tender; bowel sounds normal Extremities: Homans sign is negative, no sign of DVT Presentation: cephalic Fetal monitoringBaseline: 140 bpm, Variability: Good {> 6 bpm), Accelerations: Reactive and Decelerations: Absent Uterine activityevery 3 min Dilation: 5.5 Effacement (%): 90 Station: -1 Exam by:: Karl Ito,  rnc  Dating: By [place] 9wk --->  Estimated Date of Delivery: 11/30/17  Prenatal labs: ABO, Rh: --/--/A POS (06/01 5784) Antibody: PENDING (06/01 0655) Rubella: 2.60 (10/26 0955) RPR: Non Reactive (03/04 0824)  HBsAg: Negative (10/26 0955)  HIV: Non Reactive (03/04 0824)  GBS: Positive (05/03 0000)  2 hr Glucola *normal  Prenatal Transfer Tool  Maternal Diabetes: No Genetic Screening: Normal Maternal Ultrasounds/Referrals: Normal Fetal Ultrasounds or other Referrals:  None Maternal Substance Abuse:  No Significant Maternal Medications:  None Significant Maternal Lab Results: None  Results for orders placed or performed during the hospital encounter of 12/03/17 (from the past 24 hour(s))  Type and screen   Collection Time: 12/03/17  6:55 AM  Result Value Ref Range   ABO/RH(D) A POS    Antibody Screen PENDING    Sample Expiration      12/06/2017 Performed at [facility], [address] [place], [place] [address]     Patient Active Problem List   Diagnosis Date Noted  . Post-dates pregnancy 12/03/2017  . Group B Streptococcus carrier state affecting pregnancy 11/29/2017  . Limited prenatal care 09/27/2017  . Anemia affecting pregnancy 09/27/2017  . High-risk pregnancy 04/29/2017  . DVT (deep vein thrombosis) in pregnancy (HCC) 04/29/2017  . History of cesarean delivery 04/29/2017    Assessment: Tanya Fisher is a 22 y.o. G3P1011 at [redacted]w[redacted]d here for SOL  #DVT: on Lovenox 80 mg BID, refer to hematology at DC #Labor: SOL, TOLAC #Pain: supportive #FWB: Cat I #ID:  GBS pos, s/p 1 dose PCN #MOF: Breast #MOC: Diaphram #Circ:  NA  Thomes Dinning, MD, MS FAMILY MEDICINE RESIDENT - PGY1 12/03/2017 7:47 AM  Midwife attestation: I have seen and examined this patient; I agree with above documentation in the resident's note.   Tanya Fisher is a 22 y.o. [phone] here for labor, TOLAC  PE: Gen: calm comfortable, NAD Resp: normal effort, no distress Abd:  gravid  ROS, labs, PMH reviewed  Assessment/Plan: Admit to LD Labor: active FWB: Cat I ID: GBS pos>PCN  Donette Larry, CNM  12/03/2017, 11:37 AM

## 2017-12-03 NOTE — Progress Notes (Signed)
Dr. Janee Mornhompson informed of moderate lochia flow. FF U/1 ML. I informed him that I gave the patient the scheduled 600 mg. ibuprofen upon complaining of moderate-severe cramping. I also informed him that I have been proactive in assisting the patient OOB to the bathroom to void twice [facility] Dr. Janee Mornhompson and I conferred that the ibuprofen should be held until the lochia flow has diminished and WNL. An orders for oxycodone IR 5 mg. Every four hours PRN received.

## 2017-12-03 NOTE — Progress Notes (Signed)
ANTICOAGULATION CONSULT NOTE - Initial Consult  Pharmacy Consult for Enoxaparin Indication: DVT in pregnancy  No Known Allergies  Patient Measurements: Height: 5\' 4"  (162.6 cm) Weight: 178 lb (80.7 kg) IBW/kg (Calculated) : 54.7  Vital Signs: Temp: 97.7 F (36.5 C) (06/01 1323) Temp Source: Axillary (06/01 1323) BP: 116/42 (06/01 1323) Pulse Rate: 63 (06/01 1323)  Labs: Recent Labs    12/03/17 0655  HGB 11.9*  HCT 36.7  PLT 208    CrCl cannot be calculated (Patient's most recent lab result is older than the maximum 21 days allowed.).   Medical History: Past Medical History:  Diagnosis Date  . Asthma   . DVT (deep vein thrombosis) in pregnancy (HCC)     Medications:  Scheduled:  . enoxaparin (LOVENOX) injection  1 mg/kg Subcutaneous Q12H  . ibuprofen  600 mg Oral Q6H  . misoprostol      . prenatal multivitamin  1 tablet Oral Q1200  . [START ON 12/04/2017] senna-docusate  2 tablet Oral Q24H  . [START ON 12/04/2017] Tdap  0.5 mL Intramuscular Once    Assessment: Pharmacy has been consulted to continue patient's enoxaparin dosing. Patient was receiving 80 mg Q12H prior to admission.   Goal of Therapy:  Anti-Xa level 0.6-1 units/ml 4hrs after LMWH dose given Monitor platelets by anticoagulation protocol: Yes   Plan:  Restart Lovenox 80 mg (1 mg/kg) Q12H Check CBC Q72H   Derwood KaplanSarah K Theo Krumholz 12/03/2017,1:59 PM

## 2017-12-03 NOTE — Progress Notes (Signed)
Pt instructed on use of nitrous oxide.

## 2017-12-04 ENCOUNTER — Inpatient Hospital Stay (HOSPITAL_COMMUNITY): Admission: RE | Admit: 2017-12-04 | Payer: 59 | Source: Ambulatory Visit

## 2017-12-04 MED ORDER — IBUPROFEN 600 MG PO TABS: 600.0000 mg | ORAL_TABLET | Freq: Four times a day (QID) | ORAL | 0 refills | Status: DC

## 2017-12-04 NOTE — Discharge Summary (Signed)
OB Discharge Summary     Patient Name: Tanya Fisher DOB: 03/17/1996 MRN: [id number]  Date of admission: 12/03/2017 Delivering MD: Donette Larry   Date of discharge: 12/04/2017  Admitting diagnosis: 40.3WKS CTX Intrauterine pregnancy: [redacted]w[redacted]d     Secondary diagnosis:  Active Problems:   Post-dates pregnancy  Additional problems: DVT in pregnancy     Discharge diagnosis: Term Pregnancy Delivered and VBAC                                                                                                Post partum procedures:n/a  Augmentation: AROM  Complications: None  Hospital course:  Onset of Labor With Vaginal Delivery     22 y.o. yo [id number] at [redacted]w[redacted]d was admitted in Active Labor on 12/03/2017. Patient had an uncomplicated labor course as follows:  Membrane Rupture Time/Date: 9:05 AM ,12/03/2017   Intrapartum Procedures: Episiotomy: None [1]                                         Lacerations:     Patient had a delivery of a Viable infant. 12/03/2017  Information for the patient's newborn:  Marlise, Fahr Girl [place] [[id number]]  Delivery Method: VBAC, Spontaneous(Filed from Delivery Summary)    Pateint had an uncomplicated postpartum course.  She is ambulating, tolerating a regular diet, passing flatus, and urinating well. Patient is discharged home in stable condition on 12/04/17.   Physical exam  Vitals:   12/03/17 1132 12/03/17 1151 12/03/17 1323 12/03/17 1607  BP: (!) 120/58 130/64 (!) 116/42 112/62  Pulse: 74 (!) 57 63 79  Resp: 20 18 20  (!) 22  Temp:  98.9 F (37.2 C) 97.7 F (36.5 C) 99.2 F (37.3 C)  TempSrc:  Oral Axillary Axillary  SpO2:   100% 100%  Weight:      Height:       General: alert, cooperative and no distress Lochia: appropriate Uterine Fundus: firm Incision: N/A  Labs: Lab Results  Component Value Date   WBC 5.3 12/03/2017   HGB 11.9 (L) 12/03/2017   HCT 36.7 12/03/2017   MCV 91.5 12/03/2017   PLT 208 12/03/2017   CMP Latest Ref Rng &  Units 04/21/2017  Glucose 65 - 99 mg/dL 99  BUN 6 - 20 mg/dL 9  Creatinine 6.96 - 2.95 mg/dL 2.84  Sodium 132 - 440 mmol/L 133(L)  Potassium 3.5 - 5.1 mmol/L 4.0  Chloride 101 - 111 mmol/L 104  CO2 22 - 32 mmol/L 23  Calcium 8.9 - 10.3 mg/dL 9.3  Total Protein 6.5 - 8.1 g/dL 7.8  Total Bilirubin 0.3 - 1.2 mg/dL 0.6  Alkaline Phos 38 - 126 U/L 64  AST 15 - 41 U/L 15  ALT 14 - 54 U/L 13(L)    Discharge instruction: per After Visit Summary and "Baby and Me Booklet".  After visit meds:  Allergies as of 12/04/2017   No Known Allergies     Medication List    STOP taking these medications  ferrous sulfate 325 (65 FE) MG tablet   miconazole 2 % vaginal cream Commonly known as:  MONISTAT 7     TAKE these medications   enoxaparin 80 MG/0.8ML injection Commonly known as:  LOVENOX Inject 0.8 mLs (80 mg total) into the skin every 12 (twelve) hours.   ibuprofen 600 MG tablet Commonly known as:  ADVIL,MOTRIN Take 1 tablet (600 mg total) by mouth every 6 (six) hours.   prenatal multivitamin Tabs tablet Take 1 tablet by mouth daily at 12 noon.       Diet: routine diet  Activity: Advance as tolerated. Pelvic rest for 6 weeks.   Outpatient follow up:4 weeks, referral to hematology ordered Follow up Appt:No future appointments. Follow up Visit:No follow-ups on file.  Postpartum contraception: considering nexplanon   Newborn Data: Live born female  Birth Weight: 7 lb 3.7 oz (3280 g) APGAR: 8, 9  Newborn Delivery   Birth date/time:  12/03/2017 09:45:00 Delivery type:  VBAC, Spontaneous     Baby Feeding: Breast Disposition:home with mother   12/04/2017 Rolm Bookbinderaroline M Jesalyn Finazzo, CNM

## 2017-12-04 NOTE — Lactation Note (Signed)
This note was copied from a baby's chart. Lactation Consultation Note  Patient Name: Tanya Fisher Today's Date: 12/04/2017 Reason for consult: Term;Initial assessment  LC Follow Up:  I heard infant crying as I was charting at the nurses station.  Mother stated that she did not want to put baby to breast or hand express right now.  She stated, "I'm not getting anything."  I suggested using a DEBP to see if she could obtain any EBM and she was interested in this.  Pump set up and explained assembly, pumping and cleaning of pump.  #24 flanges appropriate at this time.  Encouraged hand expression after pumping and spoon feeding any EBM mother obtains.  Mother stated that if she did not get any pumped colostrum she wanted to give the baby a bottle.  FOB letting baby suck on his gloved finger while mother pumps.  RN updated.    Consult Status Consult Status: Follow-up Date: 12/05/17 Follow-up type: In-patient    Tanya Fisher 12/04/2017, 4:28 AM

## 2017-12-04 NOTE — Lactation Note (Signed)
This note was copied from a baby's chart. Lactation Consultation Note  Patient Name: Tanya Al PimpleBrittany Fisher Today's Date: 12/04/2017   [facility]:  Mother pumped for 15 minutes but was not able to obtain any breast milk.  Infant still fussy and mother requested a bottle.  RN and LC both in room.  RN completed LEAD teaching and I offered to show mother how to cup feed to avoid using an artificial nipple.  Mother was interested in using the bottle only.  She fed baby 10 mls without difficulty.  Infant swaddled and laid to sleep in bassinet.  Mother will feed/pump again at 0730 or earlier if baby show cues.     Maternal Data    Feeding Feeding Type: Formula Nipple Type: Slow - flow Length of feed: 5 min  LATCH Score Latch: Grasps breast easily, tongue down, lips flanged, rhythmical sucking.  Audible Swallowing: Spontaneous and intermittent  Type of Nipple: Everted at rest and after stimulation  Comfort (Breast/Nipple): Soft / non-tender  Hold (Positioning): Assistance needed to correctly position infant at breast and maintain latch.  LATCH Score: 9  Interventions    Lactation Tools Discussed/Used     Consult Status      Tanya Fisher 12/04/2017, 5:02 AM

## 2017-12-04 NOTE — Discharge Instructions (Signed)
Contraception Choices °Contraception, also called birth control, means things to use or ways to try not to get pregnant. °Hormonal birth control °This kind of birth control uses hormones. Here are some types of hormonal birth control: °· A tube that is put under skin of the arm (implant). The tube can stay in for as long as 3 years. °· Shots to get every 3 months (injections). °· Pills to take every day (birth control pills). °· A patch to change 1 time each week for 3 weeks (birth control patch). After that, the patch is taken off for 1 week. °· A ring to put in the vagina. The ring is left in for 3 weeks. Then it is taken out of the vagina for 1 week. Then a new ring is put in. °· Pills to take after unprotected sex (emergency birth control pills). ° °Barrier birth control °Here are some types of barrier birth control: °· A thin covering that is put on the penis before sex (female condom). The covering is thrown away after sex. °· A soft, loose covering that is put in the vagina before sex (female condom). The covering is thrown away after sex. °· A rubber bowl that sits over the cervix (diaphragm). The bowl must be made for you. The bowl is put into the vagina before sex. The bowl is left in for 6-8 hours after sex. It is taken out within 24 hours. °· A small, soft cup that fits over the cervix (cervical cap). The cup must be made for you. The cup can be left in for 6-8 hours after sex. It is taken out within 48 hours. °· A sponge that is put into the vagina before sex. It must be left in for at least 6 hours after sex. It must be taken out within 30 hours. Then it is thrown away. °· A chemical that kills or stops sperm from getting into the uterus (spermicide). It may be a pill, cream, jelly, or foam to put in the vagina. The chemical should be used at least 10-15 minutes before sex. ° °IUD (intrauterine) birth control °An IUD is a small, T-shaped piece of plastic. It is put inside the uterus. There are two  kinds: °· Hormone IUD. This kind can stay in for 3-5 years. °· Copper IUD. This kind can stay in for 10 years. ° °Permanent birth control °Here are some types of permanent birth control: °· Surgery to block the fallopian tubes. °· Having an insert put into each fallopian tube. °· Surgery to tie off the tubes that carry sperm (vasectomy). ° °Natural planning birth control °Here are some types of natural planning birth control: °· Not having sex on the days the woman could get pregnant. °· Using a calendar: °? To keep track of the length of each period. °? To find out what days pregnancy can happen. °? To plan to not have sex on days when pregnancy can happen. °· Watching for symptoms of ovulation and not having sex during ovulation. One way the woman can check for ovulation is to check her temperature. °· Waiting to have sex until after ovulation. ° °Summary °· Contraception, also called birth control, means things to use or ways to try not to get pregnant. °· Hormonal methods of birth control include implants, injections, pills, patches, vaginal rings, and emergency birth control pills. °· Barrier methods of birth control can include female condoms, female condoms, diaphragms, cervical caps, sponges, and spermicides. °· There are two types of   IUD (intrauterine device) birth control. An IUD can be put in a woman's uterus to prevent pregnancy for 3-5 years.  Permanent sterilization can be done through a procedure for males, females, or both.  Natural planning methods involve not having sex on the days when the woman could get pregnant. This information is not intended to replace advice given to you by your health care provider. Make sure you discuss any questions you have with your health care provider. Document Released: 04/18/2009 Document Revised: 07/01/2016 Document Reviewed: 07/01/2016 Elsevier Interactive Patient Education  2017 [employer] Vaginal Delivery, Care After Refer to this sheet in the next  few weeks. These instructions provide you with information about caring for yourself after vaginal delivery. Your health care provider may also give you more specific instructions. Your treatment has been planned according to current medical practices, but problems sometimes occur. Call your health care provider if you have any problems or questions. What can I expect after the procedure? After vaginal delivery, it is common to have:  Some bleeding from your vagina.  Soreness in your abdomen, your vagina, and the area of skin between your vaginal opening and your anus (perineum).  Pelvic cramps.  Fatigue.  Follow these instructions at home: Medicines  Take over-the-counter and prescription medicines only as told by your health care provider.  If you were prescribed an antibiotic medicine, take it as told by your health care provider. Do not stop taking the antibiotic until it is finished. Driving   Do not drive or operate heavy machinery while taking prescription pain medicine.  Do not drive for 24 hours if you received a sedative. Lifestyle  Do not drink alcohol. This is especially important if you are breastfeeding or taking medicine to relieve pain.  Do not use tobacco products, including cigarettes, chewing tobacco, or e-cigarettes. If you need help quitting, ask your health care provider. Eating and drinking  Drink at least 8 eight-ounce glasses of water every day unless you are told not to by your health care provider. If you choose to breastfeed your baby, you may need to drink more water than this.  Eat high-fiber foods every day. These foods may help prevent or relieve constipation. High-fiber foods include: ? Whole grain cereals and breads. ? Brown rice. ? Beans. ? Fresh fruits and vegetables. Activity  Return to your normal activities as told by your health care provider. Ask your health care provider what activities are safe for you.  Rest as much as possible.  Try to rest or take a nap when your baby is sleeping.  Do not lift anything that is heavier than your baby or 10 lb (4.5 kg) until your health care provider says that it is safe.  Talk with your health care provider about when you can engage in sexual activity. This may depend on your: ? Risk of infection. ? Rate of healing. ? Comfort and desire to engage in sexual activity. Vaginal Care  If you have an episiotomy or a vaginal tear, check the area every day for signs of infection. Check for: ? More redness, swelling, or pain. ? More fluid or blood. ? Warmth. ? Pus or a bad smell.  Do not use tampons or douches until your health care provider says this is safe.  Watch for any blood clots that may pass from your vagina. These may look like clumps of dark red, brown, or black discharge. General instructions  Keep your perineum clean and dry as told by your  health care provider.  Wear loose, comfortable clothing.  Wipe from front to back when you use the toilet.  Ask your health care provider if you can shower or take a bath. If you had an episiotomy or a perineal tear during labor and delivery, your health care provider may tell you not to take baths for a certain length of time.  Wear a bra that supports your breasts and fits you well.  If possible, have someone help you with household activities and help care for your baby for at least a few days after you leave the hospital.  Keep all follow-up visits for you and your baby as told by your health care provider. This is important. Contact a health care provider if:  You have: ? Vaginal discharge that has a bad smell. ? Difficulty urinating. ? Pain when urinating. ? A sudden increase or decrease in the frequency of your bowel movements. ? More redness, swelling, or pain around your episiotomy or vaginal tear. ? More fluid or blood coming from your episiotomy or vaginal tear. ? Pus or a bad smell coming from your episiotomy or  vaginal tear. ? A fever. ? A rash. ? Little or no interest in activities you used to enjoy. ? Questions about caring for yourself or your baby.  Your episiotomy or vaginal tear feels warm to the touch.  Your episiotomy or vaginal tear is separating or does not appear to be healing.  Your breasts are painful, hard, or turn red.  You feel unusually sad or worried.  You feel nauseous or you vomit.  You pass large blood clots from your vagina. If you pass a blood clot from your vagina, save it to show to your health care provider. Do not flush blood clots down the toilet without having your health care provider look at them.  You urinate more than usual.  You are dizzy or light-headed.  You have not breastfed at all and you have not had a menstrual period for 12 weeks after delivery.  You have stopped breastfeeding and you have not had a menstrual period for 12 weeks after you stopped breastfeeding. Get help right away if:  You have: ? Pain that does not go away or does not get better with medicine. ? Chest pain. ? Difficulty breathing. ? Blurred vision or spots in your vision. ? Thoughts about hurting yourself or your baby.  You develop pain in your abdomen or in one of your legs.  You develop a severe headache.  You faint.  You bleed from your vagina so much that you fill two sanitary pads in one hour. This information is not intended to replace advice given to you by your health care provider. Make sure you discuss any questions you have with your health care provider. Document Released: 06/18/2000 Document Revised: 12/03/2015 Document Reviewed: 07/06/2015 Elsevier Interactive Patient Education  2018 [employer]

## 2017-12-04 NOTE — Lactation Note (Signed)
This note was copied from a baby's chart. Lactation Consultation Note  Patient Name: Tanya Fisher Today's Date: 12/04/2017 Reason for consult: Term;Initial assessment  P2 mother whose infant is now 2615 hours old.  Mother has a 23 year old at home who she is trying to wean.    Mother recently finished feeding as I arrived.  Infant is being held by father but is fussy.  I offered to assist with latch and mother accepted.  Mother has large, soft, non tender breasts with everted nipples.  No breakdown noted.  Assisted mother to latch infant on the right breast in the football hold.  Infant had wide open mouth and flanged lips.  Audible swallows noted and mother stated that there was no pain.  She said it was "different" than when she last latched.  By "different" she meant that it was not hurting.  Encouraged to feed 8-12 times/24 hours or earlier if baby shows cues and continue STS while awake.  Suggested mother continue breast compressions during feedings and hand expression after feedings.  Colostrum container and spoon provided.  Mother verbalized understanding.    Mom made aware of O/P services, breastfeeding support groups, community resources, and our phone # for post-discharge questions.  Mother will call as needed for assistance.  RN in room and aware of feeding plan.    Maternal Data Formula Feeding for Exclusion: No Has patient been taught Hand Expression?: Yes Does the patient have breastfeeding experience prior to this delivery?: Yes  Feeding Feeding Type: Breast Fed Length of feed: 15 min(still feeding)  LATCH Score Latch: Grasps breast easily, tongue down, lips flanged, rhythmical sucking.  Audible Swallowing: Spontaneous and intermittent  Type of Nipple: Everted at rest and after stimulation  Comfort (Breast/Nipple): Soft / non-tender  Hold (Positioning): Assistance needed to correctly position infant at breast and maintain latch.  LATCH Score:  9  Interventions Interventions: Breast feeding basics reviewed;Assisted with latch;Skin to skin;Breast massage;Hand express;Position options;Support pillows;Adjust position;Breast compression  Lactation Tools Discussed/Used     Consult Status Consult Status: Follow-up Date: 12/05/17 Follow-up type: In-patient    Ithan Touhey R Kreig Parson 12/04/2017, 1:05 AM

## 2017-12-05 ENCOUNTER — Telehealth: Payer: Self-pay | Admitting: Hematology

## 2017-12-05 ENCOUNTER — Encounter: Payer: Self-pay | Admitting: Hematology

## 2017-12-05 ENCOUNTER — Ambulatory Visit: Payer: Self-pay

## 2017-12-05 NOTE — Telephone Encounter (Signed)
Hematology referral received from Cleone Slimaroline Neill, CNM [facility]. Pt has been scheduled to see Dr. Candise CheKale on 6/20 at 10am. Pt aware to arrive 30 minutes early. Letter mailed.

## 2017-12-05 NOTE — Lactation Note (Signed)
This note was copied from a baby's chart. Lactation Consultation Note   Patient Name: Tanya Fisher Today's Date: 12/05/2017   Baby at breast in unsupported football hold.  LC worked with mom to give better head/neck support to infant.  Also encouraged mom to HE prior to relatching which she did successfully.  Mom also started supporting her breast in order to help infant obtain deeper latch. Mom States infant feels latched much deeper and swallows are heard at breast.  After infant feeds 25 minutes, she falls alseep and self detaches.     Mom denies nipple pain but is concerned about milk supply.  LC reviewed feeding on demand and 8-12 feeds in a 24 hour period.  LC reviewed with mom signs to look for that infant is transferring milk well.  Mom was given info on BFSG and OP lactation appointments.    Maternal Data    Feeding Feeding Type: Breast Fed Length of feed: 25 min  LATCH Score                   Interventions Interventions: Coconut oil;Skin to skin;Support pillows  Lactation Tools Discussed/Used     Consult Status      [facility] 12/05/2017, 8:37 AM

## 2017-12-10 ENCOUNTER — Other Ambulatory Visit: Payer: Self-pay

## 2017-12-10 ENCOUNTER — Inpatient Hospital Stay (HOSPITAL_COMMUNITY)
Admission: AD | Admit: 2017-12-10 | Discharge: 2017-12-10 | Disposition: A | Discharge status: Home / Self Care | Payer: 59 | Source: Ambulatory Visit | Attending: Internal Medicine | Admitting: Internal Medicine

## 2017-12-10 ENCOUNTER — Encounter (HOSPITAL_COMMUNITY): Payer: Self-pay | Admitting: *Deleted

## 2017-12-10 DIAGNOSIS — Z7901 Long term (current) use of anticoagulants: Secondary | ICD-10-CM | POA: Diagnosis not present

## 2017-12-10 DIAGNOSIS — R102 Pelvic and perineal pain unspecified side: Secondary | ICD-10-CM

## 2017-12-10 DIAGNOSIS — Z87891 Personal history of nicotine dependence: Secondary | ICD-10-CM | POA: Diagnosis not present

## 2017-12-10 DIAGNOSIS — Z86718 Personal history of other venous thrombosis and embolism: Secondary | ICD-10-CM | POA: Insufficient documentation

## 2017-12-10 LAB — URINALYSIS, ROUTINE W REFLEX MICROSCOPIC
BACTERIA UA: NONE SEEN
BILIRUBIN URINE: NEGATIVE
Glucose, UA: NEGATIVE mg/dL
KETONES UR: NEGATIVE mg/dL
Nitrite: NEGATIVE
PH: 6 (ref 5.0–8.0)
Protein, ur: NEGATIVE mg/dL
SPECIFIC GRAVITY, URINE: 1.018 (ref 1.005–1.030)

## 2017-12-10 NOTE — Progress Notes (Signed)
Provider at bs assessing perineum area dt labial tear. No infection in area noted. explained to pt healing process takes time. Pt verbalizes understanding.

## 2017-12-10 NOTE — MAU Provider Note (Signed)
History     CSN: [id number]  Arrival date and time: 12/10/17 1341   First Provider Initiated Contact with Patient 12/10/17 1419       Chief Complaint  Patient presents with  . Wound Check   HPI Tanya Fisher is a 22 y.o. [id number] s/p SVD on 12/03/17 who presents to MAU today with complaint of bleeding from vaginal laceration site. The patient states that vaginal bleeding has improved, but she continues to see blood from the incision site on the labia. It is still painful. She is not taking any pain medication. She denies fever. She is on Lovenox for history of DVT. She has not noted any purulent discharge from the area, but feels there is an odor.   OB History    Gravida  3   Para  2   Term  2   Preterm  0   AB  1   Living  2     SAB  0   TAB  1   Ectopic  0   Multiple  0   Live Births  2           Past Medical History:  Diagnosis Date  . Asthma   . DVT (deep vein thrombosis) in pregnancy [facility](HCC)     Past Surgical History:  Procedure Laterality Date  . CESAREAN SECTION    . DILATION AND EVACUATION     Therapeutic abortion 14 wks    Family History  Problem Relation Age of Onset  . Hypertension Mother   . Diabetes Father   . Hypertension Father     Social History   Tobacco Use  . Smoking status: Former Smoker    Last attempt to quit: 07/06/2015    Years since quitting: 2.4  . Smokeless tobacco: Never Used  Substance Use Topics  . Alcohol use: No    Alcohol/week: 0.0 oz  . Drug use: No    Allergies: No Known Allergies  No medications prior to admission.    Review of Systems  Constitutional: Negative for fever.  Genitourinary: Positive for vaginal bleeding and vaginal pain. Negative for vaginal discharge.   Physical Exam   Blood pressure 115/62, pulse (!) 57, temperature 98.4 F (36.9 C), temperature source Oral, resp. rate 18, height 5\' 3"  (1.6 m), weight 164 lb 8 oz (74.6 kg), SpO2 96 %, unknown if currently  breastfeeding.  Physical Exam  Nursing note and vitals reviewed. Constitutional: She is oriented to person, place, and time. She appears well-developed and well-nourished. No distress.  HENT:  Head: Normocephalic and atraumatic.  Cardiovascular: Normal rate.  Respiratory: Effort normal.  GI: Soft. She exhibits no distension.  Genitourinary:     Neurological: She is alert and oriented to person, place, and time.  Skin: Skin is warm and dry. No erythema.  Psychiatric: She has a normal mood and affect.    Results for orders placed or performed during the hospital encounter of 12/10/17 (from the past 24 hour(s))  Urinalysis, Routine w reflex microscopic     Status: Abnormal   Collection Time: 12/10/17  1:57 PM  Result Value Ref Range   Color, Urine YELLOW YELLOW   APPearance CLEAR CLEAR   Specific Gravity, Urine 1.018 1.005 - 1.030   pH 6.0 5.0 - 8.0   Glucose, UA NEGATIVE NEGATIVE mg/dL   Hgb urine dipstick MODERATE (A) NEGATIVE   Bilirubin Urine NEGATIVE NEGATIVE   Ketones, ur NEGATIVE NEGATIVE mg/dL   Protein, ur NEGATIVE NEGATIVE  mg/dL   Nitrite NEGATIVE NEGATIVE   Leukocytes, UA SMALL (A) NEGATIVE   RBC / HPF 0-5 0 - 5 RBC/hpf   WBC, UA 11-20 0 - 5 WBC/hpf   Bacteria, UA NONE SEEN NONE SEEN   Squamous Epithelial / LPF 0-5 0 - 5   Mucus PRESENT     MAU Course  Procedures None  MDM UA today   Assessment and Plan  A: PPD #7 H/O labial laceration from delivery   P:  Discharge home Continue ibuprofen PRN for pain  Warning signs for infection reviewed Patient advised to follow-up with [facility] as scheduled for routine postpartum care Patient may return to MAU as needed or if her condition were to change or worsen  Vonzella Nipple, PA-C 12/10/2017, 3:08 PM

## 2017-12-10 NOTE — Discharge Instructions (Signed)

## 2017-12-10 NOTE — MAU Note (Signed)
Pt presents with c/o bleeding vaginal laceration that began Wednesday.  Reports  Had vaginal delivery June 1 st  With labial tear near clitoris.  Reports bleeding as large.

## 2017-12-22 ENCOUNTER — Telehealth: Payer: Self-pay

## 2017-12-22 ENCOUNTER — Encounter: Payer: 59 | Admitting: Hematology

## 2017-12-22 NOTE — Telephone Encounter (Signed)
Spoke with patient concerning her missed appointment. per 6/20 sch msg Dr. Candise CheKale requested that it be r/s. Will send patient a letter, and calender of this appointment also.

## 2018-01-06 NOTE — Progress Notes (Signed)
HEMATOLOGY/ONCOLOGY CONSULTATION NOTE  Date of Service: 01/10/2018  Patient Care Team: Patient, No Pcp Per as PCP - General (General Practice)  CHIEF COMPLAINTS/PURPOSE OF CONSULTATION:  History of DVT   HISTORY OF PRESENTING ILLNESS:   Tanya Fisher is a wonderful 22 y.o. female who has been referred to [place] by Antony Odea, CNM for evaluation and management of History of DVT. The pt reports that she is doing well overall.   The pt had a normal spontaneous vaginal delivery on 12/03/17. Earlier in her pregnancy, the pt presented to the ED on 04/21/17 with right leg pain and swelling for 3 days. She was evaluated with an [place] which revealed an acute DVT involving the right peroneal veins. She was started on lovenox and she stopped taking Lovenox one month after her clot, and returned to Lovenox in March for a month or two. She notes that she decided not to continue taking Lovenox because she did not want to.   The pt reports that she has generally been very healthy, and has not had any other previous history of blood clots. She notes that she has been pregnant three times, 2 child births, one planned abortion. Caesarean section at 39 weeks for first child, and her second child was delivered at 40 weeks vaginally. She notes that she had some anemia in her first pregnancy. She received two blood transfusions during her second pregnancy.  Oldest child at 55 years old. She denies ever using PO contraceptives.   She will be following up with her OBGYN in one week.   She first began having right leg pain at about [redacted] weeks gestation, noting that her right leg pain was not accompanied by visible swelling. She notes that it did not worsen when she walked, but the pain did worsen at night when she tried to sleep.   The pt has continued to breast feed for the last two years. She was taking PO Ferrous Sulfate but stopped this a couple months ago.  Pt explicitly stated that she would not like to have  labs drawn to determine genetic predispositions for blood clots.  Her US Venous Img Lower Unilateral Right on 04/21/17 revealed Acute deep venous thrombosis is seen involving the right peroneal veins.  Most recent lab results (12/03/17) of CBC  is as follows: all values are WNL except for HGB at 11.9, RDW at 19.9. PLT were normal at 208k.   On review of systems, pt reports expected weight loss after delivery, and denies leg pain, leg swelling, SOB, CP, and any other symptoms.   On PMHx the pt reports being generally very healthy. On Social Hx the pt reports smoking 1 cigarette each day until 2 years ago and denies much ETOH or drug use. She is not currently employed and denies any chemical exposure.  On Family Hx the pt denies any blood clots.    MEDICAL HISTORY:  Past Medical History:  Diagnosis Date  . Asthma   . DVT (deep vein thrombosis) in pregnancy [facility])     SURGICAL HISTORY: Past Surgical History:  Procedure Laterality Date  . CESAREAN SECTION    . DILATION AND EVACUATION     Therapeutic abortion 14 wks    SOCIAL HISTORY: Social History   Socioeconomic History  . Marital status: Single    Spouse name: Not on file  . Number of children: Not on file  . Years of education: Not on file  . Highest education level: Not on file  Occupational History  . Not on file  Social Needs  . Financial resource strain: Not on file  . Food insecurity:    Worry: Not on file    Inability: Not on file  . Transportation needs:    Medical: Not on file    Non-medical: Not on file  Tobacco Use  . Smoking status: Former Smoker    Last attempt to quit: 07/06/2015    Years since quitting: 2.5  . Smokeless tobacco: Never Used  Substance and Sexual Activity  . Alcohol use: No    Alcohol/week: 0.0 oz  . Drug use: No  . Sexual activity: Yes    Partners: Male    Birth control/protection: None  Lifestyle  . Physical activity:    Days per week: Not on file    Minutes per session: Not on  file  . Stress: Not on file  Relationships  . Social connections:    Talks on phone: Not on file    Gets together: Not on file    Attends religious service: Not on file    Active member of club or organization: Not on file    Attends meetings of clubs or organizations: Not on file    Relationship status: Not on file  . Intimate partner violence:    Fear of current or ex partner: Not on file    Emotionally abused: Not on file    Physically abused: Not on file    Forced sexual activity: Not on file  Other Topics Concern  . Not on file  Social History Narrative  . Not on file    FAMILY HISTORY: Family History  Problem Relation Age of Onset  . Hypertension Mother   . Diabetes Father   . Hypertension Father     ALLERGIES:  has No Known Allergies.  MEDICATIONS:  Current Outpatient Medications  Medication Sig Dispense Refill  . enoxaparin (LOVENOX) 80 MG/0.8ML injection Inject 0.8 mLs (80 mg total) into the skin every 12 (twelve) hours. (Patient not taking: Reported on 01/10/2018) 60 Syringe 11  . ibuprofen (ADVIL,MOTRIN) 600 MG tablet Take 1 tablet (600 mg total) by mouth every 6 (six) hours. (Patient not taking: Reported on 01/10/2018) 30 tablet 0  . iron polysaccharides (NIFEREX) 150 MG capsule Take 1 capsule (150 mg total) by mouth daily. Recheck iron labs with PCP/ObGyn in 3 months and adjust dose 60 capsule 3  . Prenatal Vit-Fe Fumarate-FA (PRENATAL MULTIVITAMIN) TABS tablet Take 1 tablet by mouth daily at 12 noon.     No current facility-administered medications for this visit.     REVIEW OF SYSTEMS:    10 Point review of Systems was done is negative except as noted above.  PHYSICAL EXAMINATION:   . Vitals:   01/10/18 1050  BP: 96/74  Pulse: (!) 59  Resp: 18  Temp: 98.1 F (36.7 C)  SpO2: 100%   Filed Weights   01/10/18 1050  Weight: 166 lb 14.4 oz (75.7 kg)   .Body mass index is 29.57 kg/m.  GENERAL:alert, in no acute distress and comfortable SKIN: no  acute rashes, no significant lesions EYES: conjunctiva are pink and non-injected, sclera anicteric OROPHARYNX: MMM, no exudates, no oropharyngeal erythema or ulceration NECK: supple, no JVD LYMPH:  no palpable lymphadenopathy in the cervical, axillary or inguinal regions LUNGS: clear to auscultation b/l with normal respiratory effort HEART: regular rate & rhythm ABDOMEN:  normoactive bowel sounds , non tender, not distended. Extremity: no pedal edema PSYCH: alert & oriented x  3 with fluent speech NEURO: no focal motor/sensory deficits  LABORATORY DATA:  I have reviewed the data as listed  . CBC Latest Ref Rng & Units 12/03/2017 09/05/2017 04/29/2017  WBC 4.0 - 10.5 K/uL 5.3 5.9 5.4  Hemoglobin 12.0 - 15.0 g/dL 11.9(L) 8.0(L) 10.9(L)  Hematocrit 36.0 - 46.0 % 36.7 24.6(L) 33.9(L)  Platelets 150 - 400 K/uL 208 271 309    . CMP Latest Ref Rng & Units 04/21/2017  Glucose 65 - 99 mg/dL 99  BUN 6 - 20 mg/dL 9  Creatinine 1.61 - 0.96 mg/dL 0.45  Sodium 409 - 811 mmol/L 133(L)  Potassium 3.5 - 5.1 mmol/L 4.0  Chloride 101 - 111 mmol/L 104  CO2 22 - 32 mmol/L 23  Calcium 8.9 - 10.3 mg/dL 9.3  Total Protein 6.5 - 8.1 g/dL 7.8  Total Bilirubin 0.3 - 1.2 mg/dL 0.6  Alkaline Phos 38 - 126 U/L 64  AST 15 - 41 U/L 15  ALT 14 - 54 U/L 13(L)     RADIOGRAPHIC STUDIES: I have personally reviewed the radiological images as listed and agreed with the findings in the report. No results found.  ASSESSMENT & PLAN:  22 y.o. female with   1. History of Acute DVT in 04/2017 - triggered by pregnancy. No FHX of VTE No previous personal VTE prior to this episode. Non compliant with anticoagulation with haphard use of lovenox. 2. Nicotine addiction - h/o smoking PLAN -Discussed patient's most recent labs from 12/03/17 -Strongly encouraged pt to establish care with a PCP -overt Provoking event for DVT seems to be pregnancy and history of smoking -Discussed that since this is a hormonally provoked  event there would be a contraindication to take PO estrogen containing contraceptives as these increase risk for another VTE episode -she will need to be on prophylactic lovenox 60mg  [place] daily if pt becomes pregnant again to avoid another hormonally triggered event. -Recommend that pt use compression socks, stay well hydrated, and minimize alcohol and caffeine consumption on long distance travels and also discuss other methods to minimize risk for recurrent DVT. -current has no leg symptoms and no leg pain or swelling. -she has taken about 3 months of anticoagulation in breaks and is not keen for consideration of additional anticoagulation. -rpt [place] ext to reassess if symptomatic. -Begin taking PO Iron Polysaccharide BID to complete correct iron deficiency. -Pt explicitly stated that she would not like to have labs drawn to determine genetic predispositions for blood clots -Follow up with OBGYN    RTC with Dr Candise Che as needed  All of the patients questions were answered with apparent satisfaction. The patient knows to call the clinic with any problems, questions or concerns.  The total time spent in the appt was 45 minutes and more than 50% was on counseling and direct patient cares.    Wyvonnia Lora MD MS AAHIVMS [facility] Hematology/Oncology Physician [facility]  (Office):       [phone] (Work cell):  [phone] (Fax):           [phone]  01/10/2018 11:25 AM  I, Marcelline Mates, am acting as a Neurosurgeon for Dr Candise Che.   .I have reviewed the above documentation for accuracy and completeness, and I agree with the above. Johney Maine MD

## 2018-01-10 ENCOUNTER — Inpatient Hospital Stay: Payer: 59 | Attending: Hematology | Admitting: Hematology

## 2018-01-10 ENCOUNTER — Encounter: Payer: Self-pay | Admitting: Hematology

## 2018-01-10 VITALS — BP 96/74 | HR 59 | Temp 98.1°F | Resp 18 | Ht 63.0 in | Wt 166.9 lb

## 2018-01-10 DIAGNOSIS — D500 Iron deficiency anemia secondary to blood loss (chronic): Secondary | ICD-10-CM

## 2018-01-10 DIAGNOSIS — Z7901 Long term (current) use of anticoagulants: Secondary | ICD-10-CM | POA: Insufficient documentation

## 2018-01-10 DIAGNOSIS — Z86718 Personal history of other venous thrombosis and embolism: Secondary | ICD-10-CM

## 2018-01-10 DIAGNOSIS — O2230 Deep phlebothrombosis in pregnancy, unspecified trimester: Secondary | ICD-10-CM

## 2018-01-10 MED ORDER — POLYSACCHARIDE IRON COMPLEX 150 MG PO CAPS: 150.0000 mg | ORAL_CAPSULE | Freq: Every day | ORAL | 3 refills | Status: DC

## 2018-01-11 ENCOUNTER — Telehealth: Payer: Self-pay

## 2018-01-11 NOTE — Telephone Encounter (Signed)
Per 7/9 no los 

## 2018-01-18 ENCOUNTER — Encounter: Payer: Self-pay | Admitting: Medical

## 2018-01-18 ENCOUNTER — Encounter: Payer: Self-pay | Admitting: General Practice

## 2018-01-18 ENCOUNTER — Ambulatory Visit (INDEPENDENT_AMBULATORY_CARE_PROVIDER_SITE_OTHER): Payer: 59 | Admitting: Medical

## 2018-01-18 DIAGNOSIS — O2230 Deep phlebothrombosis in pregnancy, unspecified trimester: Secondary | ICD-10-CM

## 2018-01-18 DIAGNOSIS — Z1389 Encounter for screening for other disorder: Secondary | ICD-10-CM

## 2018-01-18 DIAGNOSIS — Z3009 Encounter for other general counseling and advice on contraception: Secondary | ICD-10-CM

## 2018-01-18 MED ORDER — CAYA VA DPRH: 1.0000 [IU] | VAGINAL_INSERT | VAGINAL | 3 refills | Status: DC | PRN

## 2018-01-18 NOTE — Progress Notes (Signed)
Subjective:     Tanya Fisher is a 22 y.o. female who presents for a postpartum visit. She is 6 weeks postpartum following a spontaneous vaginal delivery. I have fully reviewed the prenatal and intrapartum course. The delivery was at 40/3 gestational weeks. Outcome: vaginal birth after cesarean (VBAC). Anesthesia: none. Postpartum course has been normal. Baby's course has been normal. Baby is feeding by breast. Bleeding no bleeding. Bowel function is normal. Bladder function is normal. Patient is sexually active. Contraception method is condoms. Postpartum depression screening: negative.  The following portions of the patient's history were reviewed and updated as appropriate: allergies, current medications, past family history, past medical history, past social history, past surgical history and problem list.  Review of Systems Pertinent items are noted in HPI.   Objective:    BP 108/74   Pulse (!) 54   Ht 5\' 4"  (1.626 m)   Wt 166 lb 12.8 oz (75.7 kg)   Breastfeeding? Yes   BMI 28.63 kg/m   General:  alert and cooperative   Breasts:  not performed  Lungs: clear to auscultation bilaterally  Heart:  regular rate and rhythm, S1, S2 normal, no murmur, click, rub or gallop  Abdomen: soft, non-tender; bowel sounds normal; no masses,  no organomegaly   Vulva:  not evaluated  Vagina: not evaluated  Cervix:  not evaluated  Corpus: not examined  Adnexa:  not evaluated  Rectal Exam: Not performed.         Assessment:     Normal postpartum exam. Pap smear not done at today's visit. Normal 2018.  Birth control counseling  H/O DVT  Plan:    1. Contraception: diaphragm. Rx sent.  2. [facility] and Lovenox was recommended BID until 6 weeks and daily after 6 weeks. Patient encouraged to follow these directions and follow-up with them as directed 3. Follow up in: 1 year for annual exam or sooner as needed.    Marny LowensteinWenzel, Denaya Horn N, PA-C 01/18/2018 11:00 AM

## 2018-01-18 NOTE — Progress Notes (Signed)
Pt wants to discuss about Birth Control Pills & other alternatives.

## 2018-01-18 NOTE — Patient Instructions (Signed)
Contraceptive Barrier Methods A barrier method is a type of birth control (contraception) that is used to prevent pregnancy. Barrier methods include:  Female condom.  Female condom.  Diaphragm.  Cervical cap.  Sponge.  Spermicide.  Your health care provider can help you decide what form of contraception is best for you. Always keep in mind that the risk of an STI (sexually transmitted infection) exists even when a contraceptive barrier method is used. Female condom A female condom is a thin sheath that is worn over the penis during sex. Condoms prevent pregnancy by catching and stopping sperm from reaching the uterus. They also help to protect against STIs. Some condoms come with a sperm-killing substance (spermicide) on them. Female condoms are made of latex, rubber, or a type of plastic called polyurethane. Condoms that are made of latex and polyurethane provide the best protection against many STIs, including HIV (human immunodeficiency virus). Female condoms can only be worn once. They should not be used with oil-based lubricants like petroleum jelly, lotions, or oils, because these lubricants make them less effective. They can be used with water-based lubricants available from your health care provider and over-the-counter. Water-based lubricants do not contain silicone, wax, or oil. Female condom The female condom is a soft, loose-fitting sheath that is put into the vagina before sex. It is held in place by two closed inner rings, one at the cervix and the other at the vaginal opening. The female condom prevents pregnancy by catching sperm and blocking its passage to the uterus. It also helps to protect against STIs. A female condom is intended for one-time use only. It can be inserted as long as 8 hours before sex. You should not use a female condom while your partner uses a female condom because the condoms can stick to each other and break. Diaphragm A diaphragm is a soft, latex or silicone  dome-shaped barrier that is placed in the vagina with spermicidal jelly before sex. A diaphragm covers the cervix, kills sperm, and blocks the passage of sperm into the cervix. It does not protect against STIs. This barrier method requires a prescription and must be fitted by a health care provider. A diaphragm can be inserted up to 2 hours before sex. If it is inserted more than 2 hours before sex, the spermicide must be applied again. A diaphragm should be left in the vagina for 6-8 hours after sex. Before sex can occur again during these 6 hours, spermicide must be reapplied. A diaphragm should not be used during a menstrual period. Cervical cap A cervical cap is a round, soft, latex or plastic cup that is put in the vagina and fits over the cervix. It stays in place by the use of suction. A cervical cap should be used with a spermicide. It provides continuous protection as long as it is in place, regardless of how many times you have sex. It does not protect against STIs. Cervical caps may be inserted as long as 6 hours before sexual activity. They must be left in place for at least 6 hours after sex and can be left in place for as long as 48 hours. A cervical cap must be fitted by a health care provider. If you lose or gain a certain amount of weight your health care provider may need to refit the cap. You cannot use a cervical cap during your period. Sponge A sponge is a soft, circular piece of polyurethane foam that has spermicide in it. It is made  wet with clean water and then placed into the vagina and over the cervix before sex. The foam is designed to trap and absorb sperm before it enters the cervix while the spermicide kills or immobilizes sperm. A sponge offers an immediate and continuous presence of spermicide throughout a 24-hour period no matter how many times you have sex. It does not protect against STIs. A sponge should be left in place for at least 6 hours after sex. It should not be left  in for more than 24 hours, and it cannot be reused. It has a loop for removal. This barrier method can be purchased over-the-counter. You may use it if you are breastfeeding. Do not use if you have your period. Spermicide Spermicides are chemicals that kill or block sperm from entering the cervix and uterus. They are inserted into the vagina with an applicator before sex. Spermicides do not protect against STIs. Spermicides come as creams, jellies, suppositories, foam, film, or tablets. Suppositories, film, and tablets should be inserted 10-30 minutes before sex so they can dissolve. To be effective, a new spermicide must be inserted every time you have sex. Summary  A barrier method is a type of birth control (contraception) that is used to prevent pregnancy.  Your health care provider can help you decide what form of contraception is best for you.  Always keep in mind that the risk of an STI (sexually transmitted infection) exists even when a contraceptive barrier method is used. This information is not intended to replace advice given to you by your health care provider. Make sure you discuss any questions you have with your health care provider. Document Released: 04/18/2007 Document Revised: 05/10/2016 Document Reviewed: 05/10/2016 Elsevier Interactive Patient Education  2017 [employer]

## 2018-01-19 LAB — POCT PREGNANCY, URINE: Preg Test, Ur: NEGATIVE

## 2018-10-30 DIAGNOSIS — A6000 Herpesviral infection of urogenital system, unspecified: Secondary | ICD-10-CM | POA: Insufficient documentation

## 2018-11-30 LAB — OB RESULTS CONSOLE PLATELET COUNT: Platelets: 268

## 2018-11-30 LAB — OB RESULTS CONSOLE RUBELLA ANTIBODY, IGM: Rubella: IMMUNE

## 2018-11-30 LAB — OB RESULTS CONSOLE GC/CHLAMYDIA
Chlamydia: NEGATIVE
Gonorrhea: NEGATIVE

## 2018-11-30 LAB — OB RESULTS CONSOLE HGB/HCT, BLOOD
HCT: 36 (ref 29–41)
Hemoglobin: 11.6

## 2018-12-06 LAB — OB RESULTS CONSOLE HEPATITIS B SURFACE ANTIGEN: Hepatitis B Surface Ag: NEGATIVE

## 2018-12-06 LAB — OB RESULTS CONSOLE RPR: RPR: NONREACTIVE

## 2018-12-06 LAB — OB RESULTS CONSOLE HIV ANTIBODY (ROUTINE TESTING): HIV: NONREACTIVE

## 2018-12-24 ENCOUNTER — Emergency Department (HOSPITAL_BASED_OUTPATIENT_CLINIC_OR_DEPARTMENT_OTHER)
Admission: EM | Admit: 2018-12-24 | Discharge: 2018-12-24 | Disposition: A | Discharge status: Home / Self Care | Payer: Medicaid Other | Attending: Emergency Medicine | Admitting: Emergency Medicine

## 2018-12-24 ENCOUNTER — Other Ambulatory Visit: Payer: Self-pay

## 2018-12-24 ENCOUNTER — Encounter (HOSPITAL_BASED_OUTPATIENT_CLINIC_OR_DEPARTMENT_OTHER): Payer: Self-pay | Admitting: Emergency Medicine

## 2018-12-24 DIAGNOSIS — O26892 Other specified pregnancy related conditions, second trimester: Secondary | ICD-10-CM | POA: Insufficient documentation

## 2018-12-24 DIAGNOSIS — Z3492 Encounter for supervision of normal pregnancy, unspecified, second trimester: Secondary | ICD-10-CM | POA: Insufficient documentation

## 2018-12-24 DIAGNOSIS — Z87891 Personal history of nicotine dependence: Secondary | ICD-10-CM | POA: Diagnosis not present

## 2018-12-24 DIAGNOSIS — R1012 Left upper quadrant pain: Secondary | ICD-10-CM

## 2018-12-24 DIAGNOSIS — J45909 Unspecified asthma, uncomplicated: Secondary | ICD-10-CM | POA: Insufficient documentation

## 2018-12-24 LAB — COMPREHENSIVE METABOLIC PANEL
ALT: 12 U/L (ref 0–44)
AST: 15 U/L (ref 15–41)
Albumin: 3.4 g/dL — ABNORMAL LOW (ref 3.5–5.0)
Alkaline Phosphatase: 63 U/L (ref 38–126)
Anion gap: 8 (ref 5–15)
BUN: 6 mg/dL (ref 6–20)
CO2: 24 mmol/L (ref 22–32)
Calcium: 9.1 mg/dL (ref 8.9–10.3)
Chloride: 105 mmol/L (ref 98–111)
Creatinine, Ser: 0.42 mg/dL — ABNORMAL LOW (ref 0.44–1.00)
GFR calc Af Amer: >60 mL/min (ref >60)
GFR calc non Af Amer: >60 mL/min (ref >60)
Glucose, Bld: 82 mg/dL (ref 70–99)
Potassium: 4.4 mmol/L (ref 3.5–5.1)
Sodium: 137 mmol/L (ref 135–145)
Total Bilirubin: 0.5 mg/dL (ref 0.3–1.2)
Total Protein: 7.4 g/dL (ref 6.5–8.1)

## 2018-12-24 LAB — URINALYSIS, ROUTINE W REFLEX MICROSCOPIC
Bilirubin Urine: NEGATIVE
Glucose, UA: NEGATIVE mg/dL
Hgb urine dipstick: NEGATIVE
Ketones, ur: NEGATIVE mg/dL
Leukocytes,Ua: NEGATIVE
Nitrite: NEGATIVE
Protein, ur: NEGATIVE mg/dL
Specific Gravity, Urine: <1.005 — ABNORMAL LOW (ref 1.005–1.030)
pH: 6.5 (ref 5.0–8.0)

## 2018-12-24 LAB — CBC WITH DIFFERENTIAL/PLATELET
Abs Immature Granulocytes: 0.04 10*3/uL (ref 0.00–0.07)
Basophils Absolute: 0 10*3/uL (ref 0.0–0.1)
Basophils Relative: 0 %
Eosinophils Absolute: 0 10*3/uL (ref 0.0–0.5)
Eosinophils Relative: 1 %
HCT: 36.9 % (ref 36.0–46.0)
Hemoglobin: 11.9 g/dL — ABNORMAL LOW (ref 12.0–15.0)
Immature Granulocytes: 1 %
Lymphocytes Relative: 31 %
Lymphs Abs: 2 10*3/uL (ref 0.7–4.0)
MCH: 31 pg (ref 26.0–34.0)
MCHC: 32.2 g/dL (ref 30.0–36.0)
MCV: 96.1 fL (ref 80.0–100.0)
Monocytes Absolute: 0.4 10*3/uL (ref 0.1–1.0)
Monocytes Relative: 7 %
Neutro Abs: 3.9 10*3/uL (ref 1.7–7.7)
Neutrophils Relative %: 60 %
Platelets: 245 10*3/uL (ref 150–400)
RBC: 3.84 MIL/uL — ABNORMAL LOW (ref 3.87–5.11)
RDW: 13.7 % (ref 11.5–15.5)
WBC: 6.5 10*3/uL (ref 4.0–10.5)
nRBC: 0 % (ref 0.0–0.2)

## 2018-12-24 LAB — LIPASE, BLOOD: Lipase: 36 U/L (ref 11–51)

## 2018-12-24 NOTE — ED Notes (Signed)
Call received from [facility] [place], [place] rapid response. Per Dr. Harolyn Rutherford pt does not need FHM as long as the information she has given about her due date is correct

## 2018-12-24 NOTE — ED Provider Notes (Signed)
[place] EMERGENCY DEPARTMENT Provider Note   CSN: [id number] Arrival date & time: 12/24/18  1747     History   Chief Complaint Chief Complaint  Patient presents with  . Abdominal Pain    5 months pregnant    HPI Tanya Fisher is a 23 y.o. female.     HPI  Patient is a 23 year old female with past medical history of asthma and DVT in pregnancy presenting for left upper quadrant discomfort.  She reports that she has had lancinating pain lasting a few seconds and experiences every 2 to 3 minutes episodes 10 AM.  Patient reports that it will hurt to breathe when it is there but otherwise she does not have any pleuritic pain.  It is focal in the left upper quadrant.  Is not reproducible on palpation.  She denies any dysuria, urgency, frequency, nausea, vomiting.  She is having normal BMs and last BM was today. She denies any chest pain or shortness of breath.  She denies any fevers or chills.  She denies any lower abdominal cramping, vaginal discharge, vaginal bleeding or gushes of fluid.  She has not tried any remedies for her symptoms.  She is followed by OB/GYN at [facility].  Past Medical History:  Diagnosis Date  . Asthma   . DVT (deep vein thrombosis) in pregnancy     Patient Active Problem List   Diagnosis Date Noted  . DVT (deep vein thrombosis) in pregnancy 04/29/2017  . History of cesarean delivery 04/29/2017    Past Surgical History:  Procedure Laterality Date  . CESAREAN SECTION    . DILATION AND EVACUATION     Therapeutic abortion 14 wks     OB History    Gravida  4   Para  2   Term  2   Preterm  0   AB  1   Living  2     SAB  0   TAB  1   Ectopic  0   Multiple  0   Live Births  2            Home Medications    Prior to Admission medications   Medication Sig Start Date End Date Taking? Authorizing Provider  Diaphragm [place]) [place] [address] 1 Units vaginally as needed (prior to intercourse). 01/18/18    Luvenia Redden, PA-C  enoxaparin (LOVENOX) 80 MG/0.8ML injection Inject 0.8 mLs (80 mg total) into the skin every 12 (twelve) hours. Patient not taking: Reported on 01/10/2018 08/11/17   Aletha Halim, MD  ibuprofen (ADVIL,MOTRIN) 600 MG tablet Take 1 tablet (600 mg total) by mouth every 6 (six) hours. Patient not taking: Reported on 01/10/2018 12/04/17   Wende Mott, CNM  iron polysaccharides (NIFEREX) 150 MG capsule Take 1 capsule (150 mg total) by mouth daily. Recheck iron labs with PCP/ObGyn in 3 months and adjust dose Patient not taking: Reported on 01/18/2018 01/10/18   Brunetta Genera, MD  Prenatal Vit-Fe Fumarate-FA (PRENATAL MULTIVITAMIN) TABS tablet Take 1 tablet by mouth daily at 12 noon.    [provider]    Family History Family History  Problem Relation Age of Onset  . Hypertension Mother   . Diabetes Father   . Hypertension Father     Social History Social History   Tobacco Use  . Smoking status: Former Smoker    Quit date: 07/06/2015    Years since quitting: 3.4  . Smokeless tobacco: Never Used  Substance Use  Topics  . Alcohol use: No    Alcohol/week: 0.0 standard drinks  . Drug use: No     Allergies   Patient has no known allergies.   Review of Systems Review of Systems  Constitutional: Negative for chills and fever.  HENT: Negative for congestion and sore throat.   Eyes: Negative for visual disturbance.  Respiratory: Negative for cough, chest tightness and shortness of breath.   Cardiovascular: Negative for chest pain, palpitations and leg swelling.  Gastrointestinal: Positive for abdominal pain. Negative for diarrhea, nausea and vomiting.  Genitourinary: Negative for dysuria and flank pain.  Musculoskeletal: Negative for back pain and myalgias.  Skin: Negative for rash.  Neurological: Negative for dizziness, syncope, light-headedness and headaches.     Physical Exam Updated Vital Signs BP 117/71 (BP Location: Left Arm)   Pulse 79    Temp 98.5 F (36.9 C) (Oral)   Resp 18   Ht 5\' 4"  (1.626 m)   Wt 69.9 kg   SpO2 100%   BMI 26.43 kg/m   Physical Exam Vitals signs and nursing note reviewed.  Constitutional:      General: She is not in acute distress.    Appearance: She is well-developed.  HENT:     Head: Normocephalic and atraumatic.  Eyes:     Conjunctiva/sclera: Conjunctivae normal.     Pupils: Pupils are equal, round, and reactive to light.  Neck:     Musculoskeletal: Normal range of motion and neck supple.  Cardiovascular:     Rate and Rhythm: Normal rate and regular rhythm.     Heart sounds: S1 normal and S2 normal. No murmur.  Pulmonary:     Effort: Pulmonary effort is normal.     Breath sounds: Normal breath sounds. No wheezing or rales.  Abdominal:     General: There is no distension.     Palpations: Abdomen is soft.     Tenderness: There is no abdominal tenderness. There is no guarding.     Comments: Gravid uterus.  Pain in the left upper quadrant is not reproducible on palpation.  Splenomegaly not palpated.  Musculoskeletal: Normal range of motion.        General: No deformity.  Lymphadenopathy:     Cervical: No cervical adenopathy.  Skin:    General: Skin is warm and dry.     Findings: No erythema or rash.  Neurological:     Mental Status: She is alert.     Comments: Cranial nerves grossly intact. Patient moves extremities symmetrically and with good coordination.  Psychiatric:        Behavior: Behavior normal.        Thought Content: Thought content normal.        Judgment: Judgment normal.      ED Treatments / Results  Labs (all labs ordered are listed, but only abnormal results are displayed) Labs Reviewed  CBC WITH DIFFERENTIAL/PLATELET - Abnormal; Notable for the following components:      Result Value   RBC 3.84 (*)    Hemoglobin 11.9 (*)    All other components within normal limits  COMPREHENSIVE METABOLIC PANEL - Abnormal; Notable for the following components:    Creatinine, Ser 0.42 (*)    Albumin 3.4 (*)    All other components within normal limits  URINALYSIS, ROUTINE W REFLEX MICROSCOPIC - Abnormal; Notable for the following components:   Color, Urine STRAW (*)    Specific Gravity, Urine <1.005 (*)    All other components within normal limits  LIPASE, BLOOD    EKG    Radiology No results found.  Procedures Procedures (including critical care time)  Medications Ordered in ED Medications - No data to display   Initial Impression / Assessment and Plan / ED Course  I have reviewed the triage vital signs and the nursing notes.  Pertinent labs & imaging results that were available during my care of the patient were reviewed by me and considered in my medical decision making (see chart for details).        This is a well-appearing [id number] 23 year old female currently [redacted] weeks pregnant presenting for intermittent left upper quadrant pain.  It is not reproducible on palpation given that patient is not having an attack of the pain at the time.  It is not pleuritic in nature.  Highly atypical symptoms or pulmonary embolism, and patient has normal vital signs.  Suspect muscular spasming.  There are no high risk features of exam or history for pregnancy complications such as premature rupture of membranes, or vaginal bleeding. FHTs 153.   Laboratory analysis is unremarkable including normal urinalysis, normal electrolytes and normal CBC.  Patient is instructed to follow-up with her OB/GYN tomorrow.  Patient is instructed to return for any constant pain, worsening pain or intractable nausea or vomiting.  Patient is in understanding and agrees with the plan of care.  This is a shared visit with Dr. Alona BeneJoshua Long. Patient was independently evaluated by this attending physician. Attending physician consulted in evaluation and discharge management.  Final Clinical Impressions(s) / ED Diagnoses   Final diagnoses:  LUQ pain  Second trimester pregnancy     ED Discharge Orders    None       Delia ChimesMurray, Gadiel John B, PA-C 12/24/18 2029    Maia PlanLong, Joshua G, MD 12/25/18 1306

## 2018-12-24 NOTE — Progress Notes (Signed)
Received call from [facility]. Pt is 20 6/[redacted] weeks gestation presenting with c/o left upper quadrant pain. Pt gets her care in [facility] with Dr. Micah Noel. No vaginal bleeding or leaking of fluid. FHR is 153 BPM. There is no monitoring to be done if gestational age is 41 6/7 weeks. I will contact our attending OB.

## 2018-12-24 NOTE — ED Triage Notes (Signed)
Patient states that she is having pain to her left upper quadrant since about 10 am - the patient reports that she is 5 months pregnant. Denies any N/V

## 2018-12-24 NOTE — ED Notes (Signed)
Patient denies vaginal discharges.

## 2018-12-24 NOTE — ED Notes (Signed)
Fetal heart rate= 153 bpm

## 2018-12-24 NOTE — Progress Notes (Signed)
Spoke with pt's RN. If pt's gestational age is accurate at 70 6/[redacted] weeks gestation, the baby is previable and no fetal monitoring is needed. Pt is OB cleared. Dr. Harolyn Rutherford can be reached at [phone] if there are any concerns. They can work the pt up for the left upper quadrant pain.

## 2018-12-24 NOTE — Progress Notes (Signed)
Spoke with Dr. Harolyn Rutherford. If pt's gestational age is accurate and a FHR of 153 BPM  has been obtained, no vaginal bleeding or leaking of fluid, pt can be OB cleared. There is no need for fetal monitoring as the baby is previable.

## 2018-12-24 NOTE — Discharge Instructions (Addendum)
Please see the information and instructions below regarding your visit.  Your diagnoses today include:  1. LUQ pain   2. Second trimester pregnancy     Tests performed today include: See side panel of your discharge paperwork for testing performed today. Vital signs are listed at the bottom of these instructions.   Your lab work was normal today.  Medications prescribed:    Take any prescribed medications only as prescribed, and any over the counter medications only as directed on the packaging.  Take Tylenol, 650 mg every 6 hours as needed for pain.  Please do not exceed 4000 mg in 1 day.  Home care instructions:  Please follow any educational materials contained in this packet.   Follow-up instructions: Please follow-up with Dr. Micah Noel by calling tomorrow.   Return instructions:  Please return to the Emergency Department if you experience worsening symptoms.  Please return if you have any other emergent concerns.  Additional Information:   Your vital signs today were: BP 117/71 (BP Location: Left Arm)    Pulse 79    Temp 98.5 F (36.9 C) (Oral)    Resp 18    Ht 5\' 4"  (1.626 m)    Wt 69.9 kg    SpO2 100%    BMI 26.43 kg/m  If your blood pressure (BP) was elevated on multiple readings during this visit above 130 for the top number or above 80 for the bottom number, please have this repeated by your primary care provider within one month. --------------  Thank you for allowing [place] to participate in your care today.

## 2019-01-25 ENCOUNTER — Other Ambulatory Visit: Payer: Self-pay

## 2019-01-25 ENCOUNTER — Encounter: Payer: 59 | Attending: Obstetrics & Gynecology | Admitting: *Deleted

## 2019-01-25 ENCOUNTER — Ambulatory Visit: Payer: Medicaid Other | Admitting: *Deleted

## 2019-01-25 DIAGNOSIS — O24410 Gestational diabetes mellitus in pregnancy, diet controlled: Secondary | ICD-10-CM

## 2019-01-25 DIAGNOSIS — A599 Trichomoniasis, unspecified: Secondary | ICD-10-CM | POA: Insufficient documentation

## 2019-01-25 DIAGNOSIS — Z3A00 Weeks of gestation of pregnancy not specified: Secondary | ICD-10-CM | POA: Insufficient documentation

## 2019-01-25 MED ORDER — ACCU-CHEK FASTCLIX LANCETS MISC: 1.0000 | Freq: Four times a day (QID) | 12 refills | Status: DC

## 2019-01-25 MED ORDER — ACCU-CHEK GUIDE VI STRP: ORAL_STRIP | 12 refills | Status: DC

## 2019-01-25 MED ORDER — ACCU-CHEK GUIDE W/DEVICE KIT: 1.0000 | PACK | Freq: Once | 0 refills | Status: AC

## 2019-01-25 NOTE — Progress Notes (Signed)
  Patient was seen on 01/25/2019 for Gestational Diabetes self-management. EDD 05/30/2019. Patient states history of GDM. Diet history obtained. Patient eats good variety of all food groups. Beverages include water.  The following learning objectives were met by the patient :   States the definition of Gestational Diabetes  States why dietary management is important in controlling blood glucose  Describes the effects of carbohydrates on blood glucose levels  Demonstrates ability to create a balanced meal plan  Demonstrates carbohydrate counting   States when to check blood glucose levels  Demonstrates proper blood glucose monitoring techniques  States the effect of stress and exercise on blood glucose levels  States the importance of limiting caffeine and abstaining from alcohol and smoking  Plan:  Aim for 3 Carb Choices per meal (45 grams) +/- 1 either way  Aim for 1-2 Carbs per snack Begin reading food labels for Total Carbohydrate of foods If OK with your MD, consider  increasing your activity level by walking, Arm Chair Exercises or other activity daily as tolerated Begin checking BG before breakfast and 2 hours after first bite of breakfast, lunch and dinner as directed by MD  Bring Log Book/Sheet and meter to every medical appointment OR use Baby Scripts (see below) Baby Scripts:  Patient was introduced to [employer] and she plans to use as record of BG electronically. Take medication if directed by MD  Blood glucose monitor Rx called into pharmacy: [facility] with Fast Clix drums Patient instructed to test pre breakfast and 2 hours each meal as directed by MD  Patient instructed to monitor glucose levels: FBS: 60 - 95 mg/dl 2 hour: <120 mg/dl  Patient received the following handouts:  Nutrition Diabetes and Pregnancy  Carbohydrate Counting List  BG Log Sheet  Patient will be seen for follow-up as needed.  Debby Freiberg. Hice, RN under supervision of Jeanie Sewer, [place]

## 2019-01-26 ENCOUNTER — Telehealth: Payer: Self-pay | Admitting: Family Medicine

## 2019-01-26 NOTE — Telephone Encounter (Signed)
Called the patient to complete the pre-screen. Left a detailed voicemail of wearing a face mask, sanitizing hands at the sanitizing station upon entering our office, and no visitors or children are allowed due to the COVID19 restrictions. Also informed the patient if she is experiencing any flu like symptoms please call our office to reschedule. °

## 2019-01-29 ENCOUNTER — Encounter: Payer: Self-pay | Admitting: Obstetrics & Gynecology

## 2019-01-29 ENCOUNTER — Other Ambulatory Visit: Payer: Self-pay

## 2019-01-29 ENCOUNTER — Ambulatory Visit (INDEPENDENT_AMBULATORY_CARE_PROVIDER_SITE_OTHER): Payer: 59 | Admitting: Obstetrics & Gynecology

## 2019-01-29 VITALS — BP 104/69 | HR 67 | Temp 98.1°F | Wt 160.4 lb

## 2019-01-29 DIAGNOSIS — Z23 Encounter for immunization: Secondary | ICD-10-CM | POA: Diagnosis not present

## 2019-01-29 DIAGNOSIS — Z3A27 27 weeks gestation of pregnancy: Secondary | ICD-10-CM

## 2019-01-29 DIAGNOSIS — O99320 Drug use complicating pregnancy, unspecified trimester: Secondary | ICD-10-CM | POA: Insufficient documentation

## 2019-01-29 DIAGNOSIS — Z3480 Encounter for supervision of other normal pregnancy, unspecified trimester: Secondary | ICD-10-CM

## 2019-01-29 DIAGNOSIS — A599 Trichomoniasis, unspecified: Secondary | ICD-10-CM

## 2019-01-29 DIAGNOSIS — O24419 Gestational diabetes mellitus in pregnancy, unspecified control: Secondary | ICD-10-CM

## 2019-01-29 DIAGNOSIS — O99322 Drug use complicating pregnancy, second trimester: Secondary | ICD-10-CM

## 2019-01-29 DIAGNOSIS — Z3482 Encounter for supervision of other normal pregnancy, second trimester: Secondary | ICD-10-CM

## 2019-01-29 LAB — GLUCOSE, 3 HOUR
Glucose, GTT - 1 Hour: 122 (ref ?–200)
Glucose, GTT - 2 Hour: 124 (ref ?–140)
Glucose, GTT - 3 Hour: 141 mg/dL — AB (ref ?–140)
Glucose, GTT - Fasting: 96 mg/dL (ref 80–110)

## 2019-01-29 LAB — HEMOGLOBIN A1C
Est. average glucose Bld gHb Est-mCnc: 97 mg/dL
Hgb A1c MFr Bld: 5 % (ref 4.8–5.6)

## 2019-01-29 MED ORDER — METRONIDAZOLE 500 MG PO TABS: ORAL_TABLET | ORAL | 0 refills | Status: DC

## 2019-01-29 MED ORDER — ENOXAPARIN SODIUM 40 MG/0.4ML ~~LOC~~ SOLN: 40.0000 mg | SUBCUTANEOUS | 12 refills | Status: DC

## 2019-01-29 MED ORDER — AMBULATORY NON FORMULARY MEDICATION: 1.0000 | 0 refills | Status: DC

## 2019-01-29 NOTE — Progress Notes (Signed)
  Subjective:    Tanya Fisher is a single P2 (4 and 24 yo kids)  being seen today for her first obstetrical visit.  She is transferring from Dr. Micah Fisher.This is not a planned pregnancy. She is at [redacted]w[redacted]d gestation. Her obstetrical history is significant for GDM and h/o DVT, h/o c/s, trich, HSV.  Relationship with FOB: significant other, not living together. Patient does intend to breast feed. Pregnancy history fully reviewed.  Patient reports no complaints.  Review of Systems:   Review of Systems Works at [employer] on [address]  Objective:     BP 104/69   Pulse 67   Temp 98.1 F (36.7 C)   Wt 160 lb 6.4 oz (72.8 kg)   LMP 07/23/2018 (Exact Date)   BMI 27.53 kg/m  Physical Exam  Exam Breathing, conversing, and ambulating normally Well nourished, well hydrated Black female, no apparent distress Heart- rrr Lungs- CTAB Abd- benign, gravid   Assessment:    Pregnancy: [id number] Patient Active Problem List   Diagnosis Date Noted  . Supervision of other normal pregnancy, antepartum 01/29/2019  . Trichimoniasis 01/29/2019  . Substance abuse affecting pregnancy, antepartum 01/29/2019  . DVT (deep vein thrombosis) in pregnancy 04/29/2017  . History of cesarean delivery 04/29/2017       Plan:     Initial labs drawn. Prenatal vitamins. Problem list reviewed and updated. 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

## 2019-01-29 NOTE — Addendum Note (Signed)
Addended by: Fidela Juneau A on: 01/29/2019 11:24 AM   Modules accepted: Orders

## 2019-02-02 ENCOUNTER — Encounter: Payer: Self-pay | Admitting: *Deleted

## 2019-02-07 ENCOUNTER — Encounter: Payer: Medicaid Other | Admitting: Obstetrics & Gynecology

## 2019-02-28 ENCOUNTER — Ambulatory Visit (INDEPENDENT_AMBULATORY_CARE_PROVIDER_SITE_OTHER): Payer: Medicaid Other | Admitting: Obstetrics and Gynecology

## 2019-02-28 ENCOUNTER — Encounter: Payer: Self-pay | Admitting: Obstetrics and Gynecology

## 2019-02-28 ENCOUNTER — Encounter: Payer: Self-pay | Admitting: Family Medicine

## 2019-02-28 ENCOUNTER — Other Ambulatory Visit: Payer: Self-pay

## 2019-02-28 VITALS — BP 109/74 | HR 83 | Wt 163.0 lb

## 2019-02-28 DIAGNOSIS — O98913 Unspecified maternal infectious and parasitic disease complicating pregnancy, third trimester: Secondary | ICD-10-CM

## 2019-02-28 DIAGNOSIS — Z3480 Encounter for supervision of other normal pregnancy, unspecified trimester: Secondary | ICD-10-CM

## 2019-02-28 DIAGNOSIS — Z3A31 31 weeks gestation of pregnancy: Secondary | ICD-10-CM

## 2019-02-28 DIAGNOSIS — O2230 Deep phlebothrombosis in pregnancy, unspecified trimester: Secondary | ICD-10-CM

## 2019-02-28 DIAGNOSIS — A599 Trichomoniasis, unspecified: Secondary | ICD-10-CM

## 2019-02-28 DIAGNOSIS — O24419 Gestational diabetes mellitus in pregnancy, unspecified control: Secondary | ICD-10-CM

## 2019-02-28 DIAGNOSIS — O09893 Supervision of other high risk pregnancies, third trimester: Secondary | ICD-10-CM

## 2019-02-28 DIAGNOSIS — Z98891 History of uterine scar from previous surgery: Secondary | ICD-10-CM

## 2019-02-28 DIAGNOSIS — Z8619 Personal history of other infectious and parasitic diseases: Secondary | ICD-10-CM | POA: Insufficient documentation

## 2019-02-28 DIAGNOSIS — O2233 Deep phlebothrombosis in pregnancy, third trimester: Secondary | ICD-10-CM

## 2019-02-28 NOTE — Progress Notes (Addendum)
Prenatal Visit Note Date: 02/28/2019 Clinic: [facility]  Subjective:  Tanya Fisher is a 23 y.o. [phone] at [redacted]w[redacted]d being seen today for ongoing prenatal care.  She is currently monitored for the following issues for this high-risk pregnancy and has Short interval between pregnancies affecting pregnancy in third trimester, antepartum; DVT (deep vein thrombosis) in pregnancy; History of VBAC; Supervision of other normal pregnancy, antepartum; Trichimoniasis; Substance abuse affecting pregnancy, antepartum; History of herpes simplex infection; and GDM (gestational diabetes mellitus) on their problem list.  Patient reports that she has b/l leg pain and discomfort similar to prior VTE. Pt started on ppx lovenox when she transferred to [place] which she states she is taking. No chest pain, sob  Contractions: Irritability. Vag. Bleeding: None.  Movement: Present. Denies leaking of fluid.   The following portions of the patient's history were reviewed and updated as appropriate: allergies, current medications, past family history, past medical history, past social history, past surgical history and problem list. Problem list updated.  Objective:   Vitals:   02/28/19 1445  BP: 109/74  Pulse: 83  Weight: 163 lb (73.9 kg)    Fetal Status: Fetal Heart Rate (bpm): 130 Fundal Height: 32 cm Movement: Present     General:  Alert, oriented and cooperative. Patient is in no acute distress.  Skin: Skin is warm and dry. No rash noted.   Cardiovascular: Normal heart rate noted  Respiratory: Normal respiratory effort, no problems with respiration noted  Abdomen: Soft, gravid, appropriate for gestational age. Pain/Pressure: Present     Pelvic:  Cervical exam deferred        Extremities: Normal range of motion.  Edema: None  Mental Status: Normal mood and affect. Normal behavior. Normal judgment and thought content.   Urinalysis:      Assessment and Plan:  Pregnancy: [id number] at  [redacted]w[redacted]d  1. Supervision of other normal pregnancy, antepartum D/w her re: BC nv  2. History of herpes simplex infection Start ppx 34-36wks  3. Gestational diabetes mellitus (GDM), antepartum, gestational diabetes method of control unspecified Normal BS values. Will set up for 35-37wk growth  4. Trichimoniasis toc at 36wks  5. DVT (deep vein thrombosis) in pregnancy See above. Exam negative. Recommend mau for b/l LE dopplers. mau aware.   6. History of VBAC Desires rpt c/s with this pregnancy. email sent off. Dates confirmed on outside records  7. Short interval between pregnancies affecting pregnancy in third trimester, antepartum  Preterm labor symptoms and general obstetric precautions including but not limited to vaginal bleeding, contractions, leaking of fluid and fetal movement were reviewed in detail with the patient. Please refer to After Visit Summary for other counseling recommendations.  Return in about 2 weeks (around 03/14/2019) for high risk, in person.   Aletha Halim, MD

## 2019-03-06 ENCOUNTER — Ambulatory Visit (HOSPITAL_COMMUNITY): Payer: 59 | Admitting: *Deleted

## 2019-03-06 ENCOUNTER — Encounter (HOSPITAL_COMMUNITY): Payer: Self-pay | Admitting: *Deleted

## 2019-03-06 ENCOUNTER — Other Ambulatory Visit: Payer: Self-pay

## 2019-03-06 ENCOUNTER — Ambulatory Visit (HOSPITAL_COMMUNITY)
Admission: RE | Admit: 2019-03-06 | Discharge: 2019-03-06 | Disposition: A | Discharge status: Home / Self Care | Payer: 59 | Source: Ambulatory Visit | Attending: Obstetrics and Gynecology | Admitting: Obstetrics and Gynecology

## 2019-03-06 DIAGNOSIS — O34219 Maternal care for unspecified type scar from previous cesarean delivery: Secondary | ICD-10-CM

## 2019-03-06 DIAGNOSIS — O99320 Drug use complicating pregnancy, unspecified trimester: Secondary | ICD-10-CM

## 2019-03-06 DIAGNOSIS — F1910 Other psychoactive substance abuse, uncomplicated: Secondary | ICD-10-CM

## 2019-03-06 DIAGNOSIS — O99323 Drug use complicating pregnancy, third trimester: Secondary | ICD-10-CM | POA: Diagnosis not present

## 2019-03-06 DIAGNOSIS — O09893 Supervision of other high risk pregnancies, third trimester: Secondary | ICD-10-CM | POA: Diagnosis not present

## 2019-03-06 DIAGNOSIS — O24419 Gestational diabetes mellitus in pregnancy, unspecified control: Secondary | ICD-10-CM | POA: Insufficient documentation

## 2019-03-06 DIAGNOSIS — Z3A32 32 weeks gestation of pregnancy: Secondary | ICD-10-CM

## 2019-03-06 DIAGNOSIS — O0933 Supervision of pregnancy with insufficient antenatal care, third trimester: Secondary | ICD-10-CM

## 2019-03-06 DIAGNOSIS — O2233 Deep phlebothrombosis in pregnancy, third trimester: Secondary | ICD-10-CM

## 2019-03-07 ENCOUNTER — Other Ambulatory Visit (HOSPITAL_COMMUNITY): Payer: Self-pay | Admitting: *Deleted

## 2019-03-07 ENCOUNTER — Encounter: Payer: Self-pay | Admitting: *Deleted

## 2019-03-07 DIAGNOSIS — Z362 Encounter for other antenatal screening follow-up: Secondary | ICD-10-CM

## 2019-03-16 ENCOUNTER — Other Ambulatory Visit: Payer: Self-pay | Admitting: Family Medicine

## 2019-03-19 ENCOUNTER — Encounter: Payer: Self-pay | Admitting: Family Medicine

## 2019-03-19 ENCOUNTER — Encounter: Payer: Medicaid Other | Admitting: Obstetrics and Gynecology

## 2019-03-30 ENCOUNTER — Telehealth: Payer: Self-pay | Admitting: Family Medicine

## 2019-03-30 NOTE — Telephone Encounter (Signed)
Attempted to contact patient to give her, her next OB appointment ( 10/5 @ 10:55) and to let her know her FMLA paperwork has been completed. No answer, left voicemail with this information. Patient instructed to give the office a call back with any concerns.

## 2019-04-03 ENCOUNTER — Other Ambulatory Visit: Payer: Self-pay

## 2019-04-03 ENCOUNTER — Ambulatory Visit (HOSPITAL_COMMUNITY): Payer: 59 | Admitting: *Deleted

## 2019-04-03 ENCOUNTER — Ambulatory Visit (HOSPITAL_COMMUNITY)
Admission: RE | Admit: 2019-04-03 | Discharge: 2019-04-03 | Disposition: A | Discharge status: Home / Self Care | Payer: 59 | Source: Ambulatory Visit | Attending: Maternal & Fetal Medicine | Admitting: Maternal & Fetal Medicine

## 2019-04-03 ENCOUNTER — Encounter (HOSPITAL_COMMUNITY): Payer: Self-pay

## 2019-04-03 VITALS — BP 113/68 | HR 75 | Temp 98.5°F

## 2019-04-03 DIAGNOSIS — O09893 Supervision of other high risk pregnancies, third trimester: Secondary | ICD-10-CM | POA: Diagnosis not present

## 2019-04-03 DIAGNOSIS — Z3A36 36 weeks gestation of pregnancy: Secondary | ICD-10-CM

## 2019-04-03 DIAGNOSIS — O24410 Gestational diabetes mellitus in pregnancy, diet controlled: Secondary | ICD-10-CM | POA: Insufficient documentation

## 2019-04-03 DIAGNOSIS — O99323 Drug use complicating pregnancy, third trimester: Secondary | ICD-10-CM

## 2019-04-03 DIAGNOSIS — O34219 Maternal care for unspecified type scar from previous cesarean delivery: Secondary | ICD-10-CM | POA: Diagnosis not present

## 2019-04-03 DIAGNOSIS — O0933 Supervision of pregnancy with insufficient antenatal care, third trimester: Secondary | ICD-10-CM

## 2019-04-03 DIAGNOSIS — O99320 Drug use complicating pregnancy, unspecified trimester: Secondary | ICD-10-CM | POA: Insufficient documentation

## 2019-04-03 DIAGNOSIS — O2233 Deep phlebothrombosis in pregnancy, third trimester: Secondary | ICD-10-CM

## 2019-04-03 DIAGNOSIS — F1910 Other psychoactive substance abuse, uncomplicated: Secondary | ICD-10-CM

## 2019-04-03 DIAGNOSIS — O24419 Gestational diabetes mellitus in pregnancy, unspecified control: Secondary | ICD-10-CM

## 2019-04-03 DIAGNOSIS — Z362 Encounter for other antenatal screening follow-up: Secondary | ICD-10-CM | POA: Diagnosis present

## 2019-04-09 ENCOUNTER — Encounter: Payer: Self-pay | Admitting: Family Medicine

## 2019-04-09 ENCOUNTER — Other Ambulatory Visit (HOSPITAL_COMMUNITY)
Admission: RE | Admit: 2019-04-09 | Discharge: 2019-04-09 | Disposition: A | Discharge status: Home / Self Care | Payer: 59 | Source: Ambulatory Visit | Attending: Family Medicine | Admitting: Family Medicine

## 2019-04-09 ENCOUNTER — Ambulatory Visit (INDEPENDENT_AMBULATORY_CARE_PROVIDER_SITE_OTHER): Payer: Medicaid Other | Admitting: Family Medicine

## 2019-04-09 ENCOUNTER — Other Ambulatory Visit: Payer: Self-pay

## 2019-04-09 VITALS — BP 121/79 | HR 95 | Temp 98.5°F | Wt 170.0 lb

## 2019-04-09 DIAGNOSIS — O2230 Deep phlebothrombosis in pregnancy, unspecified trimester: Secondary | ICD-10-CM

## 2019-04-09 DIAGNOSIS — O0971 Supervision of high risk pregnancy due to social problems, first trimester: Secondary | ICD-10-CM | POA: Diagnosis not present

## 2019-04-09 DIAGNOSIS — O0990 Supervision of high risk pregnancy, unspecified, unspecified trimester: Secondary | ICD-10-CM

## 2019-04-09 DIAGNOSIS — O99323 Drug use complicating pregnancy, third trimester: Secondary | ICD-10-CM

## 2019-04-09 DIAGNOSIS — Z98891 History of uterine scar from previous surgery: Secondary | ICD-10-CM | POA: Insufficient documentation

## 2019-04-09 DIAGNOSIS — O2233 Deep phlebothrombosis in pregnancy, third trimester: Secondary | ICD-10-CM

## 2019-04-09 DIAGNOSIS — O24410 Gestational diabetes mellitus in pregnancy, diet controlled: Secondary | ICD-10-CM

## 2019-04-09 DIAGNOSIS — A599 Trichomoniasis, unspecified: Secondary | ICD-10-CM

## 2019-04-09 DIAGNOSIS — Z3A37 37 weeks gestation of pregnancy: Secondary | ICD-10-CM

## 2019-04-09 DIAGNOSIS — O0993 Supervision of high risk pregnancy, unspecified, third trimester: Secondary | ICD-10-CM

## 2019-04-09 DIAGNOSIS — O99320 Drug use complicating pregnancy, unspecified trimester: Secondary | ICD-10-CM

## 2019-04-09 LAB — POCT URINALYSIS DIP (DEVICE)
Bilirubin Urine: NEGATIVE
Glucose, UA: NEGATIVE mg/dL
Hgb urine dipstick: NEGATIVE
Ketones, ur: NEGATIVE mg/dL
Leukocytes,Ua: NEGATIVE
Nitrite: NEGATIVE
Protein, ur: NEGATIVE mg/dL
Specific Gravity, Urine: 1.02 (ref 1.005–1.030)
Urobilinogen, UA: 0.2 mg/dL (ref 0.0–1.0)
pH: 8.5 — ABNORMAL HIGH (ref 5.0–8.0)

## 2019-04-09 NOTE — Patient Instructions (Signed)
Breastfeeding and Self-Care It is normal to have some problems when you start to breastfeed your new baby. But there are things that you can do to take care of yourself and help prevent many common problems. This includes keeping your breasts healthy and making sure that your baby's mouth attaches (latches) properly to your nipple for feedings. Work with your doctor or breastfeeding specialist (lactation consultant) to find what works best for you. Follow these instructions at home: Breastfeeding strategy   Always make sure that your baby latches properly to breastfeed.  Make sure that your baby is in a proper position. Try different breastfeeding positions to find one that works best for you and your baby.  Breastfeed when you feel like you need to make your breasts less full or when your baby shows signs of hunger. This is called "breastfeeding on demand."  Do not delay feedings.  Try to relax when it is time to feed your baby. This helps your body release milk from your breast.  To help increase milk flow: ? Remove a small amount of milk from your breast right before breastfeeding. Do this using a pump or by squeezing with your hand. ? Apply warm, moist heat to your breast right before feeding. You can do this in the shower or with hand towels soaked with warm water. ? Massage your breast right before or during feeding. Breast care   To help your breasts stay healthy and keep them from getting too dry: ? Avoid using soap on your nipples. ? Let your nipples air-dry for 3-4 minutes after each feeding. ? Use only cotton bra pads to soak up breast milk that leaks. Be sure to change the pads if they become soaked with milk. If you use bra pads that can be thrown away, change them often. ? Put some lanolin on your nipples after breastfeeding. Pure lanolin does not need to be washed off your nipple before you feed your baby again. Pure lanolin is not harmful to your baby. ? Rub some breast  milk into your nipples: ? Use your hand to squeeze out a few drops of breast milk. ? Gently massage the milk into your nipples. ? Let your nipples air-dry.  Wear a supportive nursing bra. Avoid wearing: ? Tight clothing. ? Underwire bras or bras that put pressure on your breasts.  Use ice to help relieve pain or swelling of your breasts: ? Put ice in a plastic bag. ? Place a towel between your skin and the bag. ? Leave the ice on for 20 minutes, 2-3 times a day. General instructions  Drink enough fluid to keep your pee (urine) pale yellow.  Get plenty of rest. Sleep when your baby sleeps.  Talk to your doctor or breastfeeding specialist before taking any herbal supplements. Contact a health care provider if:  You have nipple pain.  You have cracking or soreness in your nipples that lasts longer than 1 week.  Your breasts are overfilled with milk (engorgement) and this lasts longer than 48 hours.  You have a fever.  You have pus-like fluid coming from your nipple.  You have redness, a rash, swelling, itching, or burning on your breast.  Your baby does not gain weight.  Your baby loses weight. Summary  There are things that you can do to take care of yourself and help prevent many common breastfeeding problems.  Always make sure that your baby's mouth attaches (latches) to your nipple properly to breastfeed.  Keep your nipples   from getting too dry, drink plenty of fluid, and get plenty of rest.  Feed on demand. Do not delay feedings. This information is not intended to replace advice given to you by your health care provider. Make sure you discuss any questions you have with your health care provider. Document Released: 01/26/2017 Document Revised: 10/11/2018 Document Reviewed: 01/26/2017 Elsevier Patient Education  2020 Elsevier Inc.  

## 2019-04-09 NOTE — Progress Notes (Signed)
   Subjective:  Tanya Fisher is a 23 y.o. [phone] at [redacted]w[redacted]d being seen today for ongoing prenatal care.  She is currently monitored for the following issues for this high-risk pregnancy and has Supervision of high risk pregnancy, antepartum; DVT (deep vein thrombosis) in pregnancy; History of VBAC; Trichimoniasis; Substance abuse affecting pregnancy, antepartum; History of herpes simplex infection; GDM (gestational diabetes mellitus); and History of cesarean delivery on their problem list.  Patient reports no complaints.  Contractions: Irritability. Endorses some mild pelvic pressure.  Movement: Present. Denies leaking of fluid.   The following portions of the patient's history were reviewed and updated as appropriate: allergies, current medications, past family history, past medical history, past social history, past surgical history and problem list. Problem list updated.  Objective:   Vitals:   04/09/19 1116  BP: 121/79  Pulse: 95  Temp: 98.5 F (36.9 C)  Weight: 170 lb (77.1 kg)    Fetal Status: Fetal Heart Rate (bpm): 150   Movement: Present     General:  Alert, oriented and cooperative. Patient is in no acute distress.  Skin: Skin is warm and dry. No rash noted.   Cardiovascular: Normal heart rate noted  Respiratory: Normal respiratory effort, no problems with respiration noted  Abdomen: Soft, gravid, appropriate for gestational age. Pain/Pressure: Present     Pelvic:       Cervical exam deferred        Extremities: Normal range of motion.  Edema: None  Mental Status: Normal mood and affect. Normal behavior. Normal judgment and thought content.     Assessment and Plan:  Pregnancy: [id number] at [redacted]w[redacted]d  1. Supervision of high risk pregnancy, antepartum - Culture, beta strep (group b only) - GC/Chlamydia probe amp ([place])not at [facility]  2. DVT (deep vein thrombosis) in pregnancy -on Lovenox  3. Diet controlled gestational diabetes mellitus (GDM) in third  trimester -Following her diabetic diet, feels much better -Fasting sugars in the 80s  4. History of cesarean delivery Repeat CS scheduled 04/24/19, with BTL (consent signed 02/28/19)  5. History of VBAC -desires repeat CS  6. Trichimoniasis -TOC today  There are no diagnoses linked to this encounter. Term labor symptoms and general obstetric precautions including but not limited to vaginal bleeding, contractions, leaking of fluid and fetal movement were reviewed in detail with the patient. Please refer to After Visit Summary for other counseling recommendations.  Return today (on 04/09/2019) for [facility] follow up.   Juhi Lagrange L, DO

## 2019-04-10 ENCOUNTER — Encounter (HOSPITAL_COMMUNITY): Payer: Self-pay

## 2019-04-10 LAB — CERVICOVAGINAL ANCILLARY ONLY
Chlamydia: NEGATIVE
Neisseria Gonorrhea: NEGATIVE
Trichomonas: NEGATIVE

## 2019-04-10 NOTE — Patient Instructions (Signed)
SOLARA GOODCHILD  04/10/2019   Your procedure is scheduled on:  04/24/2019  Arrive at [facility] at Entrance C on [address] at [facility]  and [address]. You are invited to use the FREE valet parking or use the Visitor's parking deck.  Pick up the phone at the desk and dial [phone].  Call this number if you have problems the morning of surgery: [phone]  Remember:   Do not eat food:(After Midnight) Desps de medianoche.  Do not drink clear liquids: (After Midnight) Desps de medianoche.  Take these medicines the morning of surgery with A SIP OF WATER:  DO NOT TAKE LOVENOX 12 HOURS BEFORE YOUR SURGERY   Do not wear jewelry, make-up or nail polish.  Do not wear lotions, powders, or perfumes. Do not wear deodorant.  Do not shave 48 hours prior to surgery.  Do not bring valuables to the hospital.  [facility] is not   responsible for any belongings or valuables brought to the hospital.  Contacts, dentures or bridgework may not be worn into surgery.  Leave suitcase in the car. After surgery it may be brought to your room.  For patients admitted to the hospital, checkout time is 11:00 AM the day of              discharge.      Please read over the following fact sheets that you were given:     Preparing for Surgery

## 2019-04-13 LAB — CULTURE, BETA STREP (GROUP B ONLY): Strep Gp B Culture: NEGATIVE

## 2019-04-16 ENCOUNTER — Telehealth: Payer: Self-pay | Admitting: *Deleted

## 2019-04-16 ENCOUNTER — Encounter: Payer: Medicaid Other | Admitting: Family Medicine

## 2019-04-16 NOTE — Telephone Encounter (Signed)
Called patient to reschedule.  No answer.  Left message to call to reschedule appointment ASAP.

## 2019-04-20 ENCOUNTER — Ambulatory Visit (INDEPENDENT_AMBULATORY_CARE_PROVIDER_SITE_OTHER): Payer: 59 | Admitting: Obstetrics & Gynecology

## 2019-04-20 ENCOUNTER — Other Ambulatory Visit: Payer: Self-pay

## 2019-04-20 VITALS — BP 116/78 | HR 93 | Wt 173.5 lb

## 2019-04-20 DIAGNOSIS — Z3A38 38 weeks gestation of pregnancy: Secondary | ICD-10-CM

## 2019-04-20 DIAGNOSIS — O24410 Gestational diabetes mellitus in pregnancy, diet controlled: Secondary | ICD-10-CM

## 2019-04-20 DIAGNOSIS — O0990 Supervision of high risk pregnancy, unspecified, unspecified trimester: Secondary | ICD-10-CM

## 2019-04-20 DIAGNOSIS — O2233 Deep phlebothrombosis in pregnancy, third trimester: Secondary | ICD-10-CM

## 2019-04-20 DIAGNOSIS — Z98891 History of uterine scar from previous surgery: Secondary | ICD-10-CM

## 2019-04-20 DIAGNOSIS — O2230 Deep phlebothrombosis in pregnancy, unspecified trimester: Secondary | ICD-10-CM

## 2019-04-20 DIAGNOSIS — O0993 Supervision of high risk pregnancy, unspecified, third trimester: Secondary | ICD-10-CM | POA: Diagnosis not present

## 2019-04-20 NOTE — Progress Notes (Signed)
   PRENATAL VISIT NOTE  Subjective:  Tanya Fisher is a 23 y.o. [phone] at [redacted]w[redacted]d being seen today for ongoing prenatal care.  She is currently monitored for the following issues for this high-risk pregnancy and has Supervision of high risk pregnancy, antepartum; DVT (deep vein thrombosis) in pregnancy; History of VBAC; Trichimoniasis; Substance abuse affecting pregnancy, antepartum; History of herpes simplex infection; GDM (gestational diabetes mellitus); and History of cesarean delivery on their problem list.  Patient reports no complaints.  Contractions: Irritability. Vag. Bleeding: None.  Movement: Present. Denies leaking of fluid.   The following portions of the patient's history were reviewed and updated as appropriate: allergies, current medications, past family history, past medical history, past social history, past surgical history and problem list.   Objective:   Vitals:   04/20/19 1128  BP: 116/78  Pulse: 93  Weight: 173 lb 8 oz (78.7 kg)    Fetal Status: Fetal Heart Rate (bpm): 145   Movement: Present     General:  Alert, oriented and cooperative. Patient is in no acute distress.  Skin: Skin is warm and dry. No rash noted.   Cardiovascular: Normal heart rate noted  Respiratory: Normal respiratory effort, no problems with respiration noted  Abdomen: Soft, gravid, appropriate for gestational age.  Pain/Pressure: Absent     Pelvic: Cervical exam deferred        Extremities: Normal range of motion.  Edema: None  Mental Status: Normal mood and affect. Normal behavior. Normal judgment and thought content.   Assessment and Plan:  Pregnancy: [id number] at [redacted]w[redacted]d 1. Diet controlled gestational diabetes mellitus (GDM) in third trimester Normal CBGs reported by patient, did not bring log. Will deliver at 39 weeks  2. History of cesarean delivery 3. DVT (deep vein thrombosis) in pregnancy RCS scheduled on 10/20/120. Advised that last dose of Lovenox is on 04/22/19. Preoperative  instructions reviewed.   4. Supervision of high risk pregnancy, antepartum Considering IUD placement at time of cesarean section in lieu of BTL. Information given to patient.   Term labor symptoms and general obstetric precautions including but not limited to vaginal bleeding, contractions, leaking of fluid and fetal movement were reviewed in detail with the patient. Please refer to After Visit Summary for other counseling recommendations.   Return in about 25 days (around 05/15/2019) for 2 week incision check after cesarean section.  Future Appointments  Date Time Provider Door  04/22/2019  8:30 AM [facility] 1 [facility] None    Verita Schneiders, MD

## 2019-04-20 NOTE — Patient Instructions (Addendum)
Cesarean Delivery °Cesarean birth, or cesarean delivery, is the surgical delivery of a baby through an incision in the abdomen and the uterus. This may be referred to as a C-section. This procedure may be scheduled ahead of time, or it may be done in an emergency situation. °Tell a health care provider about: °· Any allergies you have. °· All medicines you are taking, including vitamins, herbs, eye drops, creams, and over-the-counter medicines. °· Any problems you or family members have had with anesthetic medicines. °· Any blood disorders you have. °· Any surgeries you have had. °· Any medical conditions you have. °· Whether you or any members of your family have a history of deep vein thrombosis (DVT) or pulmonary embolism (PE). °What are the risks? °Generally, this is a safe procedure. However, problems may occur, including: °· Infection. °· Bleeding. °· Allergic reactions to medicines. °· Damage to other structures or organs. °· Blood clots. °· Injury to your baby. °What happens before the procedure? °General instructions °· Follow instructions from your health care provider about eating or drinking restrictions. °· If you know that you are going to have a cesarean delivery, do not shave your pubic area. Shaving before the procedure may increase your risk of infection. °· Plan to have someone take you home from the hospital. °· Ask your health care provider what steps will be taken to prevent infection. These may include: °? Removing hair at the surgery site. °? Washing skin with a germ-killing soap. °? Taking antibiotic medicine. °· Depending on the reason for your cesarean delivery, you may have a physical exam or additional testing, such as an ultrasound. °· You may have your blood or urine tested. °Questions for your health care provider °· Ask your health care provider about: °? Changing or stopping your regular medicines. This is especially important if you are taking diabetes medicines or blood  thinners. °? Your pain management plan. This is especially important if you plan to breastfeed your baby. °? How long you will be in the hospital after the procedure. °? Any concerns you may have about receiving blood products, if you need them during the procedure. °? Cord blood banking, if you plan to collect your baby's umbilical cord blood. °· You may also want to ask your health care provider: °? Whether you will be able to hold or breastfeed your baby while you are still in the operating room. °? Whether your baby can stay with you immediately after the procedure and during your recovery. °? Whether a family member or a person of your choice can go with you into the operating room and stay with you during the procedure, immediately after the procedure, and during your recovery. °What happens during the procedure? ° °· An IV will be inserted into one of your veins. °· Fluid and medicines, such as antibiotics, will be given before the surgery. °· Fetal monitors will be placed on your abdomen to check your baby's heart rate. °· You may be given a special warming gown to wear to keep your temperature stable. °· A catheter may be inserted into your bladder through your urethra. This drains your urine during the procedure. °· You may be given one or more of the following: °? A medicine to numb the area (local anesthetic). °? A medicine to make you fall asleep (general anesthetic). °? A medicine (regional anesthetic) that is injected into your back or through a small thin tube placed in your back (spinal anesthetic or epidural anesthetic).   This numbs everything below the injection site and allows you to stay awake during your procedure. If this makes you feel nauseous, tell your health care provider. Medicines will be available to help reduce any nausea you may feel.  An incision will be made in your abdomen, and then in your uterus.  If you are awake during your procedure, you may feel tugging and pulling in  your abdomen, but you should not feel pain. If you feel pain, tell your health care provider immediately.  Your baby will be removed from your uterus. You may feel more pressure or pushing while this happens.  Immediately after birth, your baby will be dried and kept warm. You may be able to hold and breastfeed your baby.  The umbilical cord may be clamped and cut during this time. This usually occurs after waiting a period of 1-2 minutes after delivery.  Your placenta will be removed from your uterus.  Your incisions will be closed with stitches (sutures). Staples, skin glue, or adhesive strips may also be applied to the incision in your abdomen.  Bandages (dressings) may be placed over the incision in your abdomen. The procedure may vary among health care providers and hospitals. What happens after the procedure?  Your blood pressure, heart rate, breathing rate, and blood oxygen level will be monitored until you are discharged from the hospital.  You may continue to receive fluids and medicines through an IV.  You will have some pain. Medicines will be available to help control your pain.  To help prevent blood clots: ? You may be given medicines. ? You may have to wear compression stockings or devices. ? You will be encouraged to walk around when you are able.  Hospital staff will encourage and support bonding with your baby. Your hospital may have you and your baby to stay in the same room (rooming in) during your hospital stay to encourage successful bonding and breastfeeding.  You may be encouraged to cough and breathe deeply often. This helps to prevent lung problems.  If you have a catheter draining your urine, it will be removed as soon as possible after your procedure. Summary  Cesarean birth, or cesarean delivery, is the surgical delivery of a baby through an incision in the abdomen and the uterus.  Follow instructions from your health care provider about eating or  drinking restrictions before the procedure.  You will have some pain after the procedure. Medicines will be available to help control your pain.  Hospital staff will encourage and support bonding with your baby after the procedure. Your hospital may have you and your baby to stay in the same room (rooming in) during your hospital stay to encourage successful bonding and breastfeeding. This information is not intended to replace advice given to you by your health care provider. Make sure you discuss any questions you have with your health care provider. Document Released: 06/21/2005 Document Revised: 12/26/2017 Document Reviewed: 12/26/2017 Elsevier Patient Education  2020 [employer].   Intrauterine Device Information An intrauterine device (IUD) is a medical device that is inserted in the uterus to prevent pregnancy. It is a small, T-shaped device that has one or two nylon strings hanging down from it. The strings hang out of the lower part of the uterus (cervix) to allow for future IUD removal. There are two types of IUDs available:  Hormone IUD. This type of IUD is made of plastic and contains the hormone progestin (synthetic progesterone). A hormone IUD may last  3-5 years.  Copper IUD. This type of IUD has copper wire wrapped around it. A copper IUD may last up to 10 years. How is an IUD inserted? An IUD is inserted through the vagina and placed into the uterus with a minor medical procedure. The exact procedure for IUD insertion may vary among health care providers and hospitals. How does an IUD work? Synthetic progesterone in a hormonal IUD prevents pregnancy by:  Thickening cervical mucus to prevent sperm from entering the uterus.  Thinning the uterine lining to prevent a fertilized egg from being implanted there. Copper in a copper IUD prevents pregnancy by making the uterus and fallopian tubes produce a fluid that kills sperm. What are the advantages of an IUD? Advantages of  either type of IUD  It is highly effective in preventing pregnancy.  It is reversible. You can become pregnant shortly after the IUD is removed.  It is low-maintenance and can stay in place for a long time.  There are no estrogen-related side effects.  It can be used when breastfeeding.  It is not associated with weight gain.  It can be inserted right after childbirth, an abortion, or a miscarriage. Advantages of a hormone IUD  If it is inserted within 7 days of your period starting, it works right after it is inserted. If the hormone IUD is inserted at any other time in your cycle, you will need to use a backup method of birth control for 7 days after insertion.  It can make menstrual periods lighter.  It can reduce menstrual cramping.  It can be used for 3-5 years. Advantages of a copper IUD  It works right after it is inserted.  It can be used as a form of emergency birth control if it is inserted within 5 days after having unprotected sex.  It does not interfere with your body's natural hormones.  It can be used for 10 years. What are the disadvantages of an IUD?  An IUD may cause irregular menstrual bleeding for a period of time after insertion.  You may have pain during insertion and have cramping and vaginal bleeding after insertion.  An IUD may cut the uterus (uterine perforation) when it is inserted. This is rare.  An IUD may cause pelvic inflammatory disease (PID), which is an infection in the uterus and fallopian tubes. This is rare, and it usually happens during the first 20 days after the IUD is inserted.  A copper IUD can make your menstrual flow heavier and more painful. How is an IUD removed?  You will lie on your back with your knees bent and your feet in footrests (stirrups).  A device will be inserted into your vagina to spread apart the vaginal walls (speculum). This will allow your health care provider to see the strings attached to the  IUD.  Your health care provider will use a small instrument (forceps) to grasp the IUD strings and pull firmly until the IUD is removed. You may have some discomfort when the IUD is removed. Your health care provider may recommend taking over-the-counter pain relievers, such as ibuprofen, before the procedure. You may also have minor spotting for a few days after the procedure. The exact procedure for IUD removal may vary among health care providers and hospitals. Is the IUD right for me? Your health care provider will make sure you are a good candidate for an IUD and will discuss the advantages, disadvantages, and possible side effects with you. Summary  An  intrauterine device (IUD) is a medical device that is inserted in the uterus to prevent pregnancy. It is a small, T-shaped device that has one or two nylon strings hanging down from it.  A hormone IUD contains the hormone progestin (synthetic progesterone). A copper IUD has copper wire wrapped around it.  Synthetic progesterone in a hormone IUD prevents pregnancy by thickening cervical mucus and thinning the walls of the uterus. Copper in a copper IUD prevents pregnancy by making the uterus and fallopian tubes produce a fluid that kills sperm.  A hormone IUD can be left in place for 3-5 years. A copper IUD can be left in place for up to 10 years.  An IUD is inserted and removed by a health care provider. You may feel some pain during insertion and removal. Your health care provider may recommend taking over-the-counter pain medicine, such as ibuprofen, before an IUD procedure. This information is not intended to replace advice given to you by your health care provider. Make sure you discuss any questions you have with your health care provider. Document Released: 05/25/2004 Document Revised: 06/03/2017 Document Reviewed: 07/20/2016 Elsevier Patient Education  2020 [employer]

## 2019-04-22 ENCOUNTER — Other Ambulatory Visit (HOSPITAL_COMMUNITY)
Admission: RE | Admit: 2019-04-22 | Discharge: 2019-04-22 | Disposition: A | Discharge status: Home / Self Care | Payer: 59 | Source: Ambulatory Visit | Attending: Family Medicine | Admitting: Family Medicine

## 2019-04-22 ENCOUNTER — Other Ambulatory Visit: Payer: Self-pay

## 2019-04-22 DIAGNOSIS — Z01812 Encounter for preprocedural laboratory examination: Secondary | ICD-10-CM | POA: Insufficient documentation

## 2019-04-22 DIAGNOSIS — Z20828 Contact with and (suspected) exposure to other viral communicable diseases: Secondary | ICD-10-CM | POA: Diagnosis not present

## 2019-04-22 LAB — TYPE AND SCREEN
ABO/RH(D): A POS
Antibody Screen: NEGATIVE

## 2019-04-22 LAB — RPR: RPR Ser Ql: NONREACTIVE

## 2019-04-22 LAB — CBC
HCT: 32.1 % — ABNORMAL LOW (ref 36.0–46.0)
Hemoglobin: 10.5 g/dL — ABNORMAL LOW (ref 12.0–15.0)
MCH: 29.3 pg (ref 26.0–34.0)
MCHC: 32.7 g/dL (ref 30.0–36.0)
MCV: 89.7 fL (ref 80.0–100.0)
Platelets: 207 10*3/uL (ref 150–400)
RBC: 3.58 MIL/uL — ABNORMAL LOW (ref 3.87–5.11)
RDW: 14.5 % (ref 11.5–15.5)
WBC: 5.2 10*3/uL (ref 4.0–10.5)
nRBC: 0 % (ref 0.0–0.2)

## 2019-04-22 LAB — SARS CORONAVIRUS 2 BY RT PCR (HOSPITAL ORDER, PERFORMED IN ~~LOC~~ HOSPITAL LAB): SARS Coronavirus 2: NEGATIVE

## 2019-04-22 LAB — ABO/RH: ABO/RH(D): A POS

## 2019-04-22 NOTE — MAU Note (Signed)
Pt here for PAT covid swab and lab draw, denies symptoms. Swab collected. Pt verbalizes understanding to not remove blood bank bracelet.

## 2019-04-24 ENCOUNTER — Encounter (HOSPITAL_COMMUNITY)
Admission: RE | Disposition: A | Discharge status: Home / Self Care | Payer: Self-pay | Source: Home / Self Care | Attending: Family Medicine

## 2019-04-24 ENCOUNTER — Inpatient Hospital Stay (HOSPITAL_COMMUNITY)
Admission: RE | Admit: 2019-04-24 | Discharge: 2019-04-26 | DRG: 787 | Disposition: A | Discharge status: Home / Self Care | Payer: 59 | Attending: Family Medicine | Admitting: Family Medicine

## 2019-04-24 ENCOUNTER — Encounter (HOSPITAL_COMMUNITY): Payer: Self-pay | Admitting: *Deleted

## 2019-04-24 ENCOUNTER — Inpatient Hospital Stay (HOSPITAL_COMMUNITY): Payer: 59 | Admitting: Anesthesiology

## 2019-04-24 ENCOUNTER — Other Ambulatory Visit: Payer: Self-pay

## 2019-04-24 DIAGNOSIS — Z30430 Encounter for insertion of intrauterine contraceptive device: Secondary | ICD-10-CM | POA: Diagnosis not present

## 2019-04-24 DIAGNOSIS — O2230 Deep phlebothrombosis in pregnancy, unspecified trimester: Secondary | ICD-10-CM | POA: Diagnosis present

## 2019-04-24 DIAGNOSIS — O9832 Other infections with a predominantly sexual mode of transmission complicating childbirth: Secondary | ICD-10-CM | POA: Diagnosis present

## 2019-04-24 DIAGNOSIS — Z87891 Personal history of nicotine dependence: Secondary | ICD-10-CM | POA: Diagnosis not present

## 2019-04-24 DIAGNOSIS — O24420 Gestational diabetes mellitus in childbirth, diet controlled: Secondary | ICD-10-CM | POA: Diagnosis present

## 2019-04-24 DIAGNOSIS — O34211 Maternal care for low transverse scar from previous cesarean delivery: Secondary | ICD-10-CM | POA: Diagnosis present

## 2019-04-24 DIAGNOSIS — Z3A39 39 weeks gestation of pregnancy: Secondary | ICD-10-CM

## 2019-04-24 DIAGNOSIS — Z86718 Personal history of other venous thrombosis and embolism: Secondary | ICD-10-CM

## 2019-04-24 DIAGNOSIS — Z98891 History of uterine scar from previous surgery: Secondary | ICD-10-CM

## 2019-04-24 DIAGNOSIS — A6000 Herpesviral infection of urogenital system, unspecified: Secondary | ICD-10-CM | POA: Diagnosis present

## 2019-04-24 DIAGNOSIS — D649 Anemia, unspecified: Secondary | ICD-10-CM | POA: Diagnosis present

## 2019-04-24 DIAGNOSIS — O99320 Drug use complicating pregnancy, unspecified trimester: Secondary | ICD-10-CM | POA: Diagnosis present

## 2019-04-24 DIAGNOSIS — O24419 Gestational diabetes mellitus in pregnancy, unspecified control: Secondary | ICD-10-CM | POA: Diagnosis present

## 2019-04-24 DIAGNOSIS — O9902 Anemia complicating childbirth: Secondary | ICD-10-CM | POA: Diagnosis present

## 2019-04-24 LAB — CBC
HCT: 29.3 % — ABNORMAL LOW (ref 36.0–46.0)
Hemoglobin: 9.4 g/dL — ABNORMAL LOW (ref 12.0–15.0)
MCH: 29 pg (ref 26.0–34.0)
MCHC: 32.1 g/dL (ref 30.0–36.0)
MCV: 90.4 fL (ref 80.0–100.0)
Platelets: 179 10*3/uL (ref 150–400)
RBC: 3.24 MIL/uL — ABNORMAL LOW (ref 3.87–5.11)
RDW: 14.3 % (ref 11.5–15.5)
WBC: 4.4 10*3/uL (ref 4.0–10.5)
nRBC: 0 % (ref 0.0–0.2)

## 2019-04-24 LAB — RAPID HIV SCREEN (HIV 1/2 AB+AG)
HIV 1/2 Antibodies: NONREACTIVE
HIV-1 P24 Antigen - HIV24: NONREACTIVE

## 2019-04-24 LAB — GLUCOSE, CAPILLARY
Glucose-Capillary: 86 mg/dL (ref 70–99)
Glucose-Capillary: 86 mg/dL (ref 70–99)

## 2019-04-24 LAB — CREATININE, SERUM
Creatinine, Ser: 0.49 mg/dL (ref 0.44–1.00)
GFR calc Af Amer: >60 mL/min (ref >60)
GFR calc non Af Amer: >60 mL/min (ref >60)

## 2019-04-24 SURGERY — Surgical Case
Anesthesia: Spinal

## 2019-04-24 MED ORDER — GLYCOPYRROLATE 0.2 MG/ML IJ SOLN
INTRAMUSCULAR | Status: DC | PRN
  Administered 2019-04-24: 0.2 mg via INTRAVENOUS

## 2019-04-24 MED ORDER — VALACYCLOVIR HCL 500 MG PO TABS
500.0000 mg | ORAL_TABLET | Freq: Two times a day (BID) | ORAL | Status: DC
  Administered 2019-04-24 – 2019-04-26 (×4): 500 mg via ORAL
  Filled 2019-04-24 (×4): qty 1

## 2019-04-24 MED ORDER — SIMETHICONE 80 MG PO CHEW
80.0000 mg | CHEWABLE_TABLET | ORAL | Status: DC
  Administered 2019-04-24 – 2019-04-25 (×2): 80 mg via ORAL
  Filled 2019-04-24 (×2): qty 1

## 2019-04-24 MED ORDER — GABAPENTIN 300 MG PO CAPS
ORAL_CAPSULE | ORAL | Status: AC
  Filled 2019-04-24: qty 1

## 2019-04-24 MED ORDER — PHENYLEPHRINE HCL-NACL 20-0.9 MG/250ML-% IV SOLN
INTRAVENOUS | Status: DC | PRN
  Administered 2019-04-24: 60 ug/min via INTRAVENOUS

## 2019-04-24 MED ORDER — DIPHENHYDRAMINE HCL 50 MG/ML IJ SOLN
12.5000 mg | INTRAMUSCULAR | Status: DC | PRN
  Administered 2019-04-24 (×2): 12.5 mg via INTRAVENOUS
  Filled 2019-04-24 (×2): qty 1

## 2019-04-24 MED ORDER — OXYCODONE HCL 5 MG PO TABS: 5.0000 mg | ORAL_TABLET | ORAL | Status: DC | PRN

## 2019-04-24 MED ORDER — DIBUCAINE (PERIANAL) 1 % EX OINT: 1.0000 "application " | TOPICAL_OINTMENT | CUTANEOUS | Status: DC | PRN

## 2019-04-24 MED ORDER — SCOPOLAMINE 1 MG/3DAYS TD PT72: 1.0000 | MEDICATED_PATCH | Freq: Once | TRANSDERMAL | Status: DC

## 2019-04-24 MED ORDER — SENNOSIDES-DOCUSATE SODIUM 8.6-50 MG PO TABS
2.0000 | ORAL_TABLET | ORAL | Status: DC
  Administered 2019-04-24 – 2019-04-25 (×2): 2 via ORAL
  Filled 2019-04-24 (×2): qty 2

## 2019-04-24 MED ORDER — PRENATAL MULTIVITAMIN CH
1.0000 | ORAL_TABLET | Freq: Every day | ORAL | Status: DC
  Administered 2019-04-25 – 2019-04-26 (×2): 1 via ORAL
  Filled 2019-04-24 (×2): qty 1

## 2019-04-24 MED ORDER — MORPHINE SULFATE (PF) 0.5 MG/ML IJ SOLN
INTRAMUSCULAR | Status: DC | PRN
  Administered 2019-04-24: .15 mg via EPIDURAL

## 2019-04-24 MED ORDER — ACETAMINOPHEN 500 MG PO TABS
1000.0000 mg | ORAL_TABLET | ORAL | Status: AC
  Administered 2019-04-24: 1000 mg via ORAL

## 2019-04-24 MED ORDER — MENTHOL 3 MG MT LOZG: 1.0000 | LOZENGE | OROMUCOSAL | Status: DC | PRN

## 2019-04-24 MED ORDER — SODIUM CHLORIDE 0.9 % IV SOLN
INTRAVENOUS | Status: DC | PRN
  Administered 2019-04-24: 10:00:00 via INTRAVENOUS

## 2019-04-24 MED ORDER — KETOROLAC TROMETHAMINE 30 MG/ML IJ SOLN
30.0000 mg | Freq: Four times a day (QID) | INTRAMUSCULAR | Status: AC
  Administered 2019-04-24 – 2019-04-25 (×3): 30 mg via INTRAVENOUS
  Filled 2019-04-24 (×3): qty 1

## 2019-04-24 MED ORDER — ONDANSETRON HCL 4 MG/2ML IJ SOLN
INTRAMUSCULAR | Status: DC | PRN
  Administered 2019-04-24: 4 mg via INTRAVENOUS

## 2019-04-24 MED ORDER — COCONUT OIL OIL: 1.0000 "application " | TOPICAL_OIL | Status: DC | PRN

## 2019-04-24 MED ORDER — FENTANYL CITRATE (PF) 100 MCG/2ML IJ SOLN
INTRAMUSCULAR | Status: DC | PRN
  Administered 2019-04-24: 15 ug via INTRAVENOUS
  Administered 2019-04-24: 50 ug via INTRAVENOUS

## 2019-04-24 MED ORDER — NALOXONE HCL 0.4 MG/ML IJ SOLN: 0.4000 mg | INTRAMUSCULAR | Status: DC | PRN

## 2019-04-24 MED ORDER — ENOXAPARIN SODIUM 40 MG/0.4ML ~~LOC~~ SOLN
40.0000 mg | SUBCUTANEOUS | Status: DC
  Administered 2019-04-25 – 2019-04-26 (×2): 40 mg via SUBCUTANEOUS
  Filled 2019-04-24 (×2): qty 0.4

## 2019-04-24 MED ORDER — FENTANYL CITRATE (PF) 100 MCG/2ML IJ SOLN
INTRAMUSCULAR | Status: AC
  Filled 2019-04-24: qty 2

## 2019-04-24 MED ORDER — KETOROLAC TROMETHAMINE 30 MG/ML IJ SOLN: 30.0000 mg | Freq: Once | INTRAMUSCULAR | Status: DC

## 2019-04-24 MED ORDER — PHENYLEPHRINE HCL-NACL 20-0.9 MG/250ML-% IV SOLN
INTRAVENOUS | Status: AC
  Filled 2019-04-24: qty 250

## 2019-04-24 MED ORDER — OXYTOCIN 40 UNITS IN NORMAL SALINE INFUSION - SIMPLE MED: 2.5000 [IU]/h | INTRAVENOUS | Status: AC

## 2019-04-24 MED ORDER — GLYCOPYRROLATE PF 0.2 MG/ML IJ SOSY
PREFILLED_SYRINGE | INTRAMUSCULAR | Status: AC
  Filled 2019-04-24: qty 1

## 2019-04-24 MED ORDER — KETOROLAC TROMETHAMINE 30 MG/ML IJ SOLN
INTRAMUSCULAR | Status: AC
  Filled 2019-04-24: qty 1

## 2019-04-24 MED ORDER — OXYCODONE HCL 5 MG PO TABS: 5.0000 mg | ORAL_TABLET | Freq: Once | ORAL | Status: DC | PRN

## 2019-04-24 MED ORDER — OXYTOCIN 40 UNITS IN NORMAL SALINE INFUSION - SIMPLE MED
INTRAVENOUS | Status: AC
  Filled 2019-04-24: qty 1000

## 2019-04-24 MED ORDER — GABAPENTIN 300 MG PO CAPS
300.0000 mg | ORAL_CAPSULE | ORAL | Status: AC
  Administered 2019-04-24: 300 mg via ORAL

## 2019-04-24 MED ORDER — NALBUPHINE HCL 10 MG/ML IJ SOLN
5.0000 mg | INTRAMUSCULAR | Status: DC | PRN
  Filled 2019-04-24: qty 0.5

## 2019-04-24 MED ORDER — NALOXONE HCL 4 MG/10ML IJ SOLN
1.0000 ug/kg/h | INTRAVENOUS | Status: DC | PRN
  Filled 2019-04-24: qty 5

## 2019-04-24 MED ORDER — DIPHENHYDRAMINE HCL 25 MG PO CAPS: 25.0000 mg | ORAL_CAPSULE | Freq: Four times a day (QID) | ORAL | Status: DC | PRN

## 2019-04-24 MED ORDER — FENTANYL CITRATE (PF) 100 MCG/2ML IJ SOLN
50.0000 ug | INTRAMUSCULAR | Status: DC | PRN
  Administered 2019-04-24: 50 ug via INTRAVENOUS

## 2019-04-24 MED ORDER — LACTATED RINGERS IV BOLUS
500.0000 mL | Freq: Once | INTRAVENOUS | Status: AC
  Administered 2019-04-24: 500 mL via INTRAVENOUS

## 2019-04-24 MED ORDER — LEVONORGESTREL 19.5 MCG/DAY IU IUD
INTRAUTERINE_SYSTEM | INTRAUTERINE | Status: AC
  Filled 2019-04-24: qty 1

## 2019-04-24 MED ORDER — SIMETHICONE 80 MG PO CHEW
80.0000 mg | CHEWABLE_TABLET | Freq: Three times a day (TID) | ORAL | Status: DC
  Administered 2019-04-25 – 2019-04-26 (×4): 80 mg via ORAL
  Filled 2019-04-24 (×4): qty 1

## 2019-04-24 MED ORDER — CEFAZOLIN SODIUM-DEXTROSE 2-4 GM/100ML-% IV SOLN
INTRAVENOUS | Status: AC
  Filled 2019-04-24: qty 100

## 2019-04-24 MED ORDER — LEVONORGESTREL 19.5 MCG/DAY IU IUD
INTRAUTERINE_SYSTEM | Freq: Once | INTRAUTERINE | Status: AC
  Administered 2019-04-24: 10:00:00 via INTRAUTERINE

## 2019-04-24 MED ORDER — LACTATED RINGERS IV SOLN
125.0000 mL/h | INTRAVENOUS | Status: DC
  Administered 2019-04-24: 125 mL/h via INTRAVENOUS

## 2019-04-24 MED ORDER — NALBUPHINE HCL 10 MG/ML IJ SOLN
5.0000 mg | Freq: Once | INTRAMUSCULAR | Status: DC | PRN
  Filled 2019-04-24: qty 0.5

## 2019-04-24 MED ORDER — LACTATED RINGERS IV SOLN
INTRAVENOUS | Status: DC | PRN
  Administered 2019-04-24 (×3): via INTRAVENOUS

## 2019-04-24 MED ORDER — OXYCODONE HCL 5 MG/5ML PO SOLN: 5.0000 mg | Freq: Once | ORAL | Status: DC | PRN

## 2019-04-24 MED ORDER — DIPHENHYDRAMINE HCL 50 MG/ML IJ SOLN
INTRAMUSCULAR | Status: AC
  Filled 2019-04-24: qty 1

## 2019-04-24 MED ORDER — ACETAMINOPHEN 500 MG PO TABS
ORAL_TABLET | ORAL | Status: AC
  Filled 2019-04-24: qty 2

## 2019-04-24 MED ORDER — ONDANSETRON HCL 4 MG/2ML IJ SOLN: 4.0000 mg | Freq: Three times a day (TID) | INTRAMUSCULAR | Status: DC | PRN

## 2019-04-24 MED ORDER — BUPIVACAINE HCL (PF) 0.5 % IJ SOLN
INTRAMUSCULAR | Status: AC
  Filled 2019-04-24: qty 30

## 2019-04-24 MED ORDER — ZOLPIDEM TARTRATE 5 MG PO TABS: 5.0000 mg | ORAL_TABLET | Freq: Every evening | ORAL | Status: DC | PRN

## 2019-04-24 MED ORDER — FENTANYL CITRATE (PF) 100 MCG/2ML IJ SOLN: 25.0000 ug | INTRAMUSCULAR | Status: DC | PRN

## 2019-04-24 MED ORDER — OXYTOCIN 40 UNITS IN NORMAL SALINE INFUSION - SIMPLE MED: INTRAVENOUS | Status: DC | PRN

## 2019-04-24 MED ORDER — ACETAMINOPHEN 500 MG PO TABS
1000.0000 mg | ORAL_TABLET | Freq: Four times a day (QID) | ORAL | Status: DC
  Administered 2019-04-24 – 2019-04-26 (×8): 1000 mg via ORAL
  Filled 2019-04-24 (×8): qty 2

## 2019-04-24 MED ORDER — CEFAZOLIN SODIUM-DEXTROSE 2-4 GM/100ML-% IV SOLN
2.0000 g | INTRAVENOUS | Status: AC
  Administered 2019-04-24: 10:00:00 2 g via INTRAVENOUS

## 2019-04-24 MED ORDER — TETANUS-DIPHTH-ACELL PERTUSSIS 5-2.5-18.5 LF-MCG/0.5 IM SUSP: 0.5000 mL | Freq: Once | INTRAMUSCULAR | Status: DC

## 2019-04-24 MED ORDER — LACTATED RINGERS IV SOLN
INTRAVENOUS | Status: DC
  Administered 2019-04-24 (×2): via INTRAVENOUS

## 2019-04-24 MED ORDER — OXYTOCIN 10 UNIT/ML IJ SOLN
INTRAMUSCULAR | Status: DC | PRN
  Administered 2019-04-24: 40 [IU] via INTRAMUSCULAR

## 2019-04-24 MED ORDER — IBUPROFEN 800 MG PO TABS
800.0000 mg | ORAL_TABLET | Freq: Four times a day (QID) | ORAL | Status: DC
  Administered 2019-04-25 – 2019-04-26 (×5): 800 mg via ORAL
  Filled 2019-04-24 (×5): qty 1

## 2019-04-24 MED ORDER — SIMETHICONE 80 MG PO CHEW: 80.0000 mg | CHEWABLE_TABLET | ORAL | Status: DC | PRN

## 2019-04-24 MED ORDER — WITCH HAZEL-GLYCERIN EX PADS: 1.0000 "application " | MEDICATED_PAD | CUTANEOUS | Status: DC | PRN

## 2019-04-24 MED ORDER — KETOROLAC TROMETHAMINE 30 MG/ML IJ SOLN
30.0000 mg | Freq: Once | INTRAMUSCULAR | Status: AC
  Administered 2019-04-24: 30 mg via INTRAVENOUS

## 2019-04-24 MED ORDER — ONDANSETRON HCL 4 MG/2ML IJ SOLN: 4.0000 mg | Freq: Once | INTRAMUSCULAR | Status: DC | PRN

## 2019-04-24 MED ORDER — BUPIVACAINE HCL (PF) 0.5 % IJ SOLN
INTRAMUSCULAR | Status: DC | PRN
  Administered 2019-04-24: 30 mL

## 2019-04-24 MED ORDER — BUPIVACAINE IN DEXTROSE 0.75-8.25 % IT SOLN
INTRATHECAL | Status: DC | PRN
  Administered 2019-04-24: 1.6 mL via INTRATHECAL

## 2019-04-24 MED ORDER — MORPHINE SULFATE (PF) 0.5 MG/ML IJ SOLN
INTRAMUSCULAR | Status: AC
  Filled 2019-04-24: qty 10

## 2019-04-24 MED ORDER — MEPERIDINE HCL 25 MG/ML IJ SOLN: 6.2500 mg | INTRAMUSCULAR | Status: DC | PRN

## 2019-04-24 MED ORDER — DIPHENHYDRAMINE HCL 25 MG PO CAPS: 25.0000 mg | ORAL_CAPSULE | ORAL | Status: DC | PRN

## 2019-04-24 MED ORDER — SODIUM CHLORIDE 0.9% FLUSH
3.0000 mL | INTRAVENOUS | Status: DC | PRN
  Administered 2019-04-24: 3 mL via INTRAVENOUS
  Filled 2019-04-24: qty 3

## 2019-04-24 SURGICAL SUPPLY — 39 items
BENZOIN TINCTURE PRP APPL 2/3 (GAUZE/BANDAGES/DRESSINGS) ×3 IMPLANT
CHLORAPREP W/TINT 26ML (MISCELLANEOUS) ×3 IMPLANT
CLAMP CORD UMBIL (MISCELLANEOUS) IMPLANT
CLOSURE STERI-STRIP 1/2X4 (GAUZE/BANDAGES/DRESSINGS) ×1
CLOSURE WOUND 1/2 X4 (GAUZE/BANDAGES/DRESSINGS) ×1
CLOTH BEACON ORANGE TIMEOUT ST (SAFETY) ×3 IMPLANT
CLSR STERI-STRIP ANTIMIC 1/2X4 (GAUZE/BANDAGES/DRESSINGS) ×1 IMPLANT
DRSG OPSITE POSTOP 4X10 (GAUZE/BANDAGES/DRESSINGS) ×3 IMPLANT
ELECT REM PT RETURN 9FT ADLT (ELECTROSURGICAL) ×3
ELECTRODE REM PT RTRN 9FT ADLT (ELECTROSURGICAL) ×1 IMPLANT
EXTRACTOR VACUUM M CUP 4 TUBE (SUCTIONS) IMPLANT
EXTRACTOR VACUUM M CUP 4' TUBE (SUCTIONS)
GLOVE BIOGEL PI IND STRL 7.0 (GLOVE) ×2 IMPLANT
GLOVE BIOGEL PI IND STRL 7.5 (GLOVE) ×2 IMPLANT
GLOVE BIOGEL PI INDICATOR 7.0 (GLOVE) ×4
GLOVE BIOGEL PI INDICATOR 7.5 (GLOVE) ×4
GLOVE ECLIPSE 7.5 STRL STRAW (GLOVE) ×3 IMPLANT
GOWN STRL REUS W/TWL LRG LVL3 (GOWN DISPOSABLE) ×9 IMPLANT
HEMOSTAT ARISTA ABSORB 3G PWDR (HEMOSTASIS) ×2 IMPLANT
KIT ABG SYR 3ML LUER SLIP (SYRINGE) IMPLANT
NDL HYPO 25X5/8 SAFETYGLIDE (NEEDLE) IMPLANT
NEEDLE HYPO 25X5/8 SAFETYGLIDE (NEEDLE) IMPLANT
NS IRRIG 1000ML POUR BTL (IV SOLUTION) ×3 IMPLANT
PACK C SECTION WH (CUSTOM PROCEDURE TRAY) ×3 IMPLANT
PAD ABD 7.5X8 STRL (GAUZE/BANDAGES/DRESSINGS) ×2 IMPLANT
PAD OB MATERNITY 4.3X12.25 (PERSONAL CARE ITEMS) ×3 IMPLANT
PENCIL SMOKE EVAC W/HOLSTER (ELECTROSURGICAL) ×3 IMPLANT
RTRCTR C-SECT PINK 25CM LRG (MISCELLANEOUS) ×3 IMPLANT
SPONGE GAUZE 4X4 12PLY STER LF (GAUZE/BANDAGES/DRESSINGS) ×4 IMPLANT
SPONGE LAP 18X18 RF (DISPOSABLE) ×2 IMPLANT
STRIP CLOSURE SKIN 1/2X4 (GAUZE/BANDAGES/DRESSINGS) ×2 IMPLANT
SUT VIC AB 0 CTX 36 (SUTURE) ×8
SUT VIC AB 0 CTX36XBRD ANBCTRL (SUTURE) ×3 IMPLANT
SUT VIC AB 2-0 CT1 27 (SUTURE) ×2
SUT VIC AB 2-0 CT1 TAPERPNT 27 (SUTURE) ×1 IMPLANT
SUT VIC AB 4-0 KS 27 (SUTURE) ×3 IMPLANT
TOWEL OR 17X24 6PK STRL BLUE (TOWEL DISPOSABLE) ×3 IMPLANT
TRAY FOLEY W/BAG SLVR 14FR LF (SET/KITS/TRAYS/PACK) ×3 IMPLANT
WATER STERILE IRR 1000ML POUR (IV SOLUTION) ×3 IMPLANT

## 2019-04-24 NOTE — Anesthesia Preprocedure Evaluation (Addendum)
Anesthesia Evaluation  Patient identified by MRN, date of birth, ID band Patient awake    Reviewed: Allergy & Precautions, NPO status , Patient's Chart, lab work & pertinent test results  History of Anesthesia Complications Negative for: history of anesthetic complications  Airway Mallampati: II  TM Distance: >3 FB Neck ROM: Full    Dental   Pulmonary asthma , former smoker,    Pulmonary exam normal        Cardiovascular + DVT  Normal cardiovascular exam     Neuro/Psych negative neurological ROS  negative psych ROS   GI/Hepatic negative GI ROS, Neg liver ROS,   Endo/Other  diabetes, Gestational  Renal/GU negative Renal ROS  negative genitourinary   Musculoskeletal negative musculoskeletal ROS (+)   Abdominal   Peds  Hematology negative hematology ROS (+)   Anesthesia Other Findings Home lovenox for h/o DVT  Reproductive/Obstetrics                            Anesthesia Physical Anesthesia Plan  ASA: III  Anesthesia Plan: Spinal   Post-op Pain Management:    Induction:   PONV Risk Score and Plan: 3 and Ondansetron and Treatment may vary due to age or medical condition  Airway Management Planned: Natural Airway  Additional Equipment: None  Intra-op Plan:   Post-operative Plan:   Informed Consent: I have reviewed the patients History and Physical, chart, labs and discussed the procedure including the risks, benefits and alternatives for the proposed anesthesia with the patient or authorized representative who has indicated his/her understanding and acceptance.       Plan Discussed with:   Anesthesia Plan Comments:        Anesthesia Quick Evaluation

## 2019-04-24 NOTE — H&P (Signed)
Faculty Practice H&P  ONIYA MANDARINO is a 23 y.o. female [phone] with IUP at [redacted]w[redacted]d presenting for repeat cesarean section for elective repeat and HSV. Pregnancy was been complicated by h/o c/s, GDM, HSV, h/o DVT on lovenox (last dose on 10/17).    Pt states she has been having no contractions, no vaginal bleeding, intact membranes, with normal fetal movement.     Prenatal Course Source of Care: Dr Micah Noel, transferred to [facility] with onset of care at 52 weeks  Pregnancy complications or risks: Patient Active Problem List   Diagnosis Date Noted  . Status post cesarean section 04/24/2019  . History of cesarean delivery 04/09/2019  . History of herpes simplex infection 02/28/2019  . GDM (gestational diabetes mellitus) 02/28/2019  . Trichimoniasis 01/29/2019  . Substance abuse affecting pregnancy, antepartum 01/29/2019  . Supervision of high risk pregnancy, antepartum 04/29/2017  . DVT (deep vein thrombosis) in pregnancy 04/29/2017  . History of VBAC 04/29/2017   She desires IUD for contraception.  She plans to breastfeed  Prenatal labs and studies: ABO, Rh: --/--/A POS, A POS Performed at [facility], [place] [address] [place], [place] [address]  [phone]) Antibody: NEG (10/18 0845) Rubella: Immune (05/28 0000) RPR: NON REACTIVE (10/18 0845)  HBsAg: Negative (06/03 0000)  HIV: NON REACTIVE (10/20 0700)  GBS: Negative/-- (10/05 1229)  2hr Glucola: positive Genetic screening: normal Anatomy US: normal  Past Medical History:  Past Medical History:  Diagnosis Date  . Asthma   . DVT (deep vein thrombosis) in pregnancy   . Gestational diabetes     Past Surgical History:  Past Surgical History:  Procedure Laterality Date  . CESAREAN SECTION    . DILATION AND EVACUATION     Therapeutic abortion 14 wks    Obstetrical History:  OB History    Gravida  4   Para  2   Term  2   Preterm  0   AB  1   Living  2     SAB  0   TAB  1   Ectopic  0   Multiple   0   Live Births  2           Gynecological History:  OB History    Gravida  4   Para  2   Term  2   Preterm  0   AB  1   Living  2     SAB  0   TAB  1   Ectopic  0   Multiple  0   Live Births  2           Social History:  Social History   Socioeconomic History  . Marital status: Single    Spouse name: Not on file  . Number of children: Not on file  . Years of education: Not on file  . Highest education level: Not on file  Occupational History  . Not on file  Social Needs  . Financial resource strain: Not on file  . Food insecurity    Worry: Not on file    Inability: Not on file  . Transportation needs    Medical: Not on file    Non-medical: Not on file  Tobacco Use  . Smoking status: Former Smoker    Quit date: 07/06/2015    Years since quitting: 3.8  . Smokeless tobacco: Never Used  Substance and Sexual Activity  . Alcohol use: No    Alcohol/week: 0.0  standard drinks  . Drug use: No  . Sexual activity: Yes    Partners: Male    Birth control/protection: None  Lifestyle  . Physical activity    Days per week: Not on file    Minutes per session: Not on file  . Stress: Not on file  Relationships  . Social Musician on phone: Not on file    Gets together: Not on file    Attends religious service: Not on file    Active member of club or organization: Not on file    Attends meetings of clubs or organizations: Not on file    Relationship status: Not on file  Other Topics Concern  . Not on file  Social History Narrative  . Not on file    Family History:  Family History  Problem Relation Age of Onset  . Hypertension Mother   . Diabetes Father   . Hypertension Father     Medications:  Prenatal vitamins,  Current Facility-Administered Medications  Medication Dose Route Frequency Provider Last Rate Last Dose  . ceFAZolin (ANCEF) IVPB 2g/100 mL premix  2 g Intravenous On Call to OR Levie Heritage, DO      . lactated  ringers infusion  125 mL/hr Intravenous Continuous Levie Heritage, DO 125 mL/hr at 04/24/19 2035 125 mL/hr at 04/24/19 0814  . levonorgestrel (LILETTA) 19.5 MCG/DAY IUD   Intrauterine Once Rachal Dvorsky J, DO        Allergies: No Known Allergies  Review of Systems: - negative  Physical Exam: Pulse 88, temperature 98.7 F (37.1 C), temperature source Oral, resp. rate 16, height 5\' 4"  (1.626 m), weight 78.7 kg, last menstrual period 07/23/2018, SpO2 99 %, currently breastfeeding. GENERAL: Well-developed, well-nourished female in no acute distress.  LUNGS: Clear to auscultation bilaterally.  HEART: Regular rate and rhythm. ABDOMEN: Soft, nontender, nondistended, gravid. EFW 7 lbs EXTREMITIES: Nontender, no edema, 2+ distal pulses. FHT:  Baseline rate 157 bpm      Pertinent Labs/Studies:   Lab Results  Component Value Date   WBC 5.2 04/22/2019   HGB 10.5 (L) 04/22/2019   HCT 32.1 (L) 04/22/2019   MCV 89.7 04/22/2019   PLT 207 04/22/2019    Assessment : BRAYLEE LAL is a 23 y.o. [phone] at [redacted]w[redacted]d being admitted for cesarean section secondary to elective repeat, HSV  Plan: The risks of cesarean section discussed with the patient included but were not limited to: bleeding which may require transfusion or reoperation; infection which may require antibiotics; injury to bowel, bladder, ureters or other surrounding organs; injury to the fetus; need for additional procedures including hysterectomy in the event of a life-threatening hemorrhage; placental abnormalities wth subsequent pregnancies, incisional problems, thromboembolic phenomenon and other postoperative/anesthesia complications. The patient concurred with the proposed plan, giving informed written consent for the procedure.   Patient has been NPO since last night and will remain NPO for procedure.  Preoperative prophylactic Ancef ordered on call to the OR.    [redacted]w[redacted]d, DO 04/24/2019, 9:19 AM

## 2019-04-24 NOTE — Discharge Summary (Addendum)
Postpartum Discharge Summary  Patient Name: Tanya Fisher DOB: 1995/11/24 MRN: [id number]  Date of admission: 04/24/2019 Delivering Provider: Truett Mainland   Date of discharge: 04/28/2019  Admitting diagnosis: RCS Intrauterine pregnancy: [redacted]w[redacted]d    Secondary diagnosis:  Active Problems:   DVT (deep vein thrombosis) in pregnancy   Substance abuse affecting pregnancy, antepartum   GDM (gestational diabetes mellitus)   History of cesarean delivery   Status post cesarean section  Additional problems: GDM, HSV, h/o DVT on lovenox (last dose on 10/17)      Discharge diagnosis: Term Pregnancy Delivered, GDM A1 and Anemia                                                                                                Post partum procedures: post placental IUD ([place]  Augmentation: n/a  Complications: None  Hospital course:  Scheduled C/S   23y.o. yo [id number] admitted to the hospital 04/24/2019 for scheduled cesarean section with the following indication:Elective Repeat.  Membrane Rupture Time/Date: 10:02 AM ,04/24/2019   Patient delivered a Viable infant.04/24/2019  Details of operation can be found in separate operative note.  Patient had a postpartum course significant for on POD#1 having a Hgb of 7.9 (9.4 preop); she was started on po iron. Lovenox was also restarted on POD#1 and will be continued x 6wks PP.  She is ambulating, tolerating a regular diet, passing flatus, and urinating well. Patient is discharged home in stable condition on 04/26/19.        Delivery time: 10:03 AM    Magnesium Sulfate received: No BMZ received: No Rhophylac:N/A MMR:N/A Transfusion:No  Physical exam  Vitals:   04/25/19 0500 04/25/19 1531 04/25/19 2213 04/26/19 0522  BP: (!) 119/55 122/64 107/70 110/67  Pulse: 67 81 97 71  Resp: _0 Temp: 98 F (36.7 C) 98.9 F (37.2 C) 98.2 F (36.8 C) 98.7 F (37.1 C)  TempSrc: Oral Oral Oral Oral  SpO2: 100%  100% 100%   Weight:      Height:       General: alert, cooperative and no distress Lochia: appropriate Uterine Fundus: firm Incision: Healing well with no significant drainage, No significant erythema, Dressing is clean, dry, and intact DVT Evaluation: No evidence of DVT seen on physical exam. Labs: Lab Results  Component Value Date   WBC 5.9 04/25/2019   HGB 7.9 (L) 04/25/2019   HCT 24.8 (L) 04/25/2019   MCV 90.8 04/25/2019   PLT 150 04/25/2019   CMP Latest Ref Rng & Units 04/24/2019  Glucose 70 - 99 mg/dL -  BUN 6 - 20 mg/dL -  Creatinine 0.44 - 1.00 mg/dL 0.49  Sodium 135 - 145 mmol/L -  Potassium 3.5 - 5.1 mmol/L -  Chloride 98 - 111 mmol/L -  CO2 22 - 32 mmol/L -  Calcium 8.9 - 10.3 mg/dL -  Total Protein 6.5 - 8.1 g/dL -  Total Bilirubin 0.3 - 1.2 mg/dL -  Alkaline Phos 38 - 126 U/L -  AST 15 - 41 U/L -  ALT 0 -  44 U/L -    Discharge instruction: per After Visit Summary and "Baby and Me Booklet".  After visit meds:  Allergies as of 04/26/2019   No Known Allergies     Medication List    STOP taking these medications   Accu-Chek FastClix Lancets Misc   Accu-Chek Guide test strip Generic drug: glucose blood     TAKE these medications   acetaminophen 500 MG tablet Commonly known as: TYLENOL Take 2 tablets (1,000 mg total) by mouth every 6 (six) hours.   enoxaparin 40 MG/0.4ML injection Commonly known as: LOVENOX Inject 0.4 mLs (40 mg total) into the skin daily.   ibuprofen 800 MG tablet Commonly known as: ADVIL Take 1 tablet (800 mg total) by mouth every 6 (six) hours.   iron polysaccharides 150 MG capsule Commonly known as: NIFEREX Take 1 capsule (150 mg total) by mouth daily. Recheck iron labs with PCP/ObGyn in 3 months and adjust dose   oxyCODONE 5 MG immediate release tablet Commonly known as: Oxy IR/ROXICODONE Take 1 tablet (5 mg total) by mouth every 4 (four) hours as needed for moderate pain.   prenatal multivitamin Tabs tablet Take 1 tablet by  mouth daily.       Diet: routine diet  Activity: Advance as tolerated. Pelvic rest for 6 weeks.   Outpatient follow JO:ACZYSAYT check in 1-2 wks; PP visit with GTT in 4-6 wks Follow up Appt: Future Appointments  Date Time Provider [place]  05/08/2019  1:30 PM [place]  05/22/2019  9:35 AM [place], [place]  06/05/2019  8:50 AM [facility]   Follow up Visit: Please schedule this patient for Postpartum visit in: 4 weeks with the following provider: MD For C/S patients schedule nurse incision check in weeks 2 weeks: yes High risk pregnancy complicated by: GDM, HSV, h/o DVT on lovenox (last dose on 10/17).   Delivery mode:  CS Anticipated Birth Control:  IUD, post placental PP Procedures needed: Incision check, 2 hour GTT Schedule Integrated BH visit: no  Newborn Data: Live born female  Birth Weight:  3485 g APGAR: 9, 10  Newborn Delivery   Birth date/time: 04/24/2019 10:03:00 Delivery type: C-Section, Low Transverse Trial of labor: No C-section categorization: Repeat      Baby Feeding: Bottle and Breast Disposition:home with mother   04/28/2019 Myrtis Ser, CNM

## 2019-04-24 NOTE — Op Note (Signed)
Operative Note   SURGERY DATE: 04/24/2019  PRE-OP DIAGNOSIS:  Pregnancy at 39 weeks H/o Cesarean Section H/o HSV H/o DVT on Lovenox (last dose 10/17)  POST-OP DIAGNOSIS:  Pregnancy at 39 weeks H/o Cesarean Section H/o HSV H/o DVT on Lovenox (last dose 10/17)   PROCEDURE: repeat low transverse cesarean section via pfannenstiel skin incision with double layer uterine closure, Liletta IUD placement  SURGEON: Surgeon(s) and Role:    * Stinson, Jacob J, DO - Primary    * Jarius Dieudonne L, DO - Assisting  ASSISTANT: None  ANESTHESIA: spinal, 0.5% marcaine  ESTIMATED BLOOD LOSS: 551  DRAINS: 170 mL UOP via indwelling foley  TOTAL IV FLUIDS: 2400 mL crystalloid  VTE PROPHYLAXIS: SCDs to bilateral lower extremities  ANTIBIOTICS: Two grams of Cefazolin were given., within 1 hour of skin incision  SPECIMENS: None  COMPLICATIONS: None  INDICATIONS: Repeat Elective Cesarean Section  FINDINGS: No intra-abdominal adhesions were noted. Grossly normal uterus, tubes and ovaries. Clear amniotic fluid, cephalic female infant, weight per medical record, APGARs 9/9, intact placenta.  PROCEDURE IN DETAIL: The patient was taken to the operating room where anesthesia was administered and normal fetal heart tones were confirmed. She was then prepped and draped in the normal fashion in the dorsal supine position with a leftward tilt.  After a time out was performed, a pfannensteil skin incision was made with the scalpel and carried through to the underlying layer of fascia. The fascia was then incised at the midline and this incision was extended laterally with the mayo scissors. Attention was turned to the superior aspect of the fascial incision which was grasped with the kocher clamps x 2, tented up and the rectus muscles were dissected off with the mayo scissors. The rectus muscles were then separated in the midline and the peritoneum was entered bluntly. The vesicouterine peritoneum was  identified.  A low transverse hysterotomy was made with the scalpel until the endometrial cavity was breached and the amniotic sac ruptured with the Allis clamp, yielding clear amniotic fluid. This incision was extended bluntly and the infant's head, shoulders and body were delivered atraumatically.The cord was clamped x 2 and cut, and the infant was handed to the awaiting pediatricians, after delayed cord clamping was done.  The placenta was then gradually expressed from the uterus and then the uterus was cleared of all clots and debris. The hysterotomy was repaired with a running suture of 1-0 Vicryl. Before the hysterotomy was closed, a Liletta IUD was placed into the uterus and the strings were directed to the cervix.  A second imbricating layer of 1-0 Vicryl suture was then placed. Several figure-of-eight sutures of 1-0 Vicryl were added, as well as the use of Arista, to achieve excellent hemostasis.   The hysterotomy and all operative sites were reinspected and excellent hemostasis was noted after irrigation and suction of the abdomen with warm saline.  The peritoneum was closed with a running stitch of 3-0 Vicryl. The fascia was reapproximated with 0 Vicryl in a simple running fashion bilaterally. The skin was injected with 0.5% marcaine and then closed with 4-0 Vicryl, in a subcuticular fashion.  The patient  tolerated the procedure well. Sponge, lap, needle, and instrument counts were correct x 2. The patient was transferred to the recovery room awake, alert and breathing independently in stable condition.  Merilyn Baba, DO OB Fellow, Faculty Practice 04/24/2019 11:23 AM

## 2019-04-24 NOTE — Transfer of Care (Signed)
Immediate Anesthesia Transfer of Care Note  Patient: Tanya Fisher  Procedure(s) Performed: CESAREAN SECTION (N/A )  Patient Location: PACU  Anesthesia Type:Spinal  Level of Consciousness: awake  Airway & Oxygen Therapy: Patient Spontanous Breathing  Post-op Assessment: Report given to RN  Post vital signs: Reviewed and stable  Last Vitals:  Vitals Value Taken Time  BP    Temp    Pulse 94 04/24/19 1112  Resp 28 04/24/19 1112  SpO2 81 % 04/24/19 1112  Vitals shown include unvalidated device data.  Last Pain:  Vitals:   04/24/19 0804  TempSrc: Oral         Complications: No apparent anesthesia complications

## 2019-04-24 NOTE — Anesthesia Procedure Notes (Signed)
Spinal  Patient location during procedure: OR Start time: 04/24/2019 9:44 AM End time: 04/24/2019 9:44 AM Staffing Anesthesiologist: Lidia Collum, MD Performed: anesthesiologist  Preanesthetic Checklist Completed: patient identified, site marked, surgical consent, pre-op evaluation, timeout performed, IV checked, risks and benefits discussed and monitors and equipment checked Spinal Block Patient position: sitting Prep: DuraPrep Patient monitoring: heart rate, cardiac monitor, continuous pulse ox and blood pressure Approach: midline Location: L3-4 Injection technique: single-shot Needle Needle type: Sprotte  Needle gauge: 24 G Needle length: 9 cm Assessment Sensory level: T4

## 2019-04-24 NOTE — Anesthesia Postprocedure Evaluation (Signed)
Anesthesia Post Note  Patient: Adin Hector  Procedure(s) Performed: CESAREAN SECTION (N/A )     Patient location during evaluation: PACU Anesthesia Type: Spinal Level of consciousness: awake and alert Pain management: pain level controlled Vital Signs Assessment: post-procedure vital signs reviewed and stable Respiratory status: spontaneous breathing, nonlabored ventilation and respiratory function stable Cardiovascular status: blood pressure returned to baseline and stable Postop Assessment: no apparent nausea or vomiting, no headache, no backache and spinal receding Anesthetic complications: no    Last Vitals:  Vitals:   04/24/19 1225 04/24/19 1330  BP: (!) 101/51 (!) 99/54  Pulse: 60 (!) 57  Resp: 16 17  Temp: 36.6 C 36.7 C  SpO2: 100% 100%    Last Pain:  Vitals:   04/24/19 1333  TempSrc:   PainSc: 0-No pain   Pain Goal:                   Lidia Collum

## 2019-04-25 LAB — CBC
HCT: 24.8 % — ABNORMAL LOW (ref 36.0–46.0)
Hemoglobin: 7.9 g/dL — ABNORMAL LOW (ref 12.0–15.0)
MCH: 28.9 pg (ref 26.0–34.0)
MCHC: 31.9 g/dL (ref 30.0–36.0)
MCV: 90.8 fL (ref 80.0–100.0)
Platelets: 150 10*3/uL (ref 150–400)
RBC: 2.73 MIL/uL — ABNORMAL LOW (ref 3.87–5.11)
RDW: 14.5 % (ref 11.5–15.5)
WBC: 5.9 10*3/uL (ref 4.0–10.5)
nRBC: 0 % (ref 0.0–0.2)

## 2019-04-25 MED ORDER — FERROUS SULFATE 325 (65 FE) MG PO TABS
325.0000 mg | ORAL_TABLET | Freq: Two times a day (BID) | ORAL | Status: DC
  Administered 2019-04-25 – 2019-04-26 (×2): 325 mg via ORAL
  Filled 2019-04-25 (×2): qty 1

## 2019-04-25 NOTE — Progress Notes (Signed)
CSW received consult for hx of marijuana use.  Referral was screened out due to the following: ~MOB had no documented substance use after initial prenatal visit/+UPT. ~MOB had no positive drug screens after initial prenatal visit/+UPT. ~Baby's UDS is negative.  Please consult CSW if current concerns arise or by MOB's request.  CSW will monitor CDS results and make report to Child Protective Services if warranted.   Tanya Stgermaine S. Tanya Fisher, MSW, LCSW [facility] [phone]    

## 2019-04-25 NOTE — Progress Notes (Addendum)
POSTPARTUM PROGRESS NOTE  POD #1  Subjective:  Tanya Fisher is a 23 y.o. [phone] s/p rLTCS at [redacted]w[redacted]d.  She reports she doing well. No acute events overnight. She reports she is doing well but is having some itching. She denies any problems with ambulating, voiding or po intake. Denies nausea or vomiting. She has passed flatus, has not had BM. Pain is well controlled.  Lochia is appropriate.  Objective: Blood pressure (!) 119/55, pulse 67, temperature 98 F (36.7 C), temperature source Oral, resp. rate 18, height 5\' 4"  (1.626 m), weight 78.7 kg, last menstrual period 07/23/2018, SpO2 100 %, unknown if currently breastfeeding.  Physical Exam:  General: alert, cooperative and no distress Chest: no respiratory distress Heart:regular rate, distal pulses intact Abdomen: soft, nontender,  Uterine Fundus: firm, appropriately tender DVT Evaluation: No calf swelling or tenderness Extremities: no edema, place SCDs while examining  Skin: warm, dry; incision covered in dressing with tears in it from scratching.   Recent Labs    04/24/19 1249 04/25/19 0558  HGB 9.4* 7.9*  HCT 29.3* 24.8*    Assessment/Plan: Tanya Fisher is a 23 y.o. [phone] s/p LTCS at [redacted]w[redacted]d for elective repeat and HSV.  POD#1 - Doing welll; pain well controlled. H/H appropriate  Routine postpartum care  OOB, ambulated  Lovenox for VTE prophylaxis Anemia: asymptomatic   Contraception: IUD placed intra-op  Feeding: breast   Dispo: Plan for discharge tomorrow or Friday   LOS: 1 day   Gifford Shave, MD  PGY-1, [facility]  04/25/2019, 8:28 AM  GME ATTESTATION:  I saw and evaluated the patient. I agree with the findings and the plan of care as documented in the resident's note, with the addition of the following.  PO iron added for Hgb <8  Merilyn Baba, DO OB Fellow, Faculty Practice 04/25/2019 3:31 PM

## 2019-04-25 NOTE — Lactation Note (Signed)
This note was copied from a baby's chart. Lactation Consultation Note Baby 60 hrs old. Mom states baby is BF well. Mom has 24 yr old that she stated she BF for 3 yrs, 24 yr old that she BF for few weeks. The 2nd child had tongue tie and caused a lot of feeding issues. Mom was trying to get out of bed when [facility] came. Mom needed to go to the BR. Encouraged mom to call for questions or concerns. Newborn feeding habits, STS, I&O reviewed. Lactation brochure given. Encouraged to call for questions or concerns.  Patient Name: Girl Matti Killingsworth Today's Date: 04/25/2019 Reason for consult: Initial assessment;Maternal endocrine disorder Type of Endocrine Disorder?: Diabetes   Maternal Data Does the patient have breastfeeding experience prior to this delivery?: Yes  Feeding    LATCH Score                   Interventions Interventions: Breast feeding basics reviewed  Lactation Tools Discussed/Used WIC Program: Yes   Consult Status Consult Status: Follow-up Date: 04/25/19 Follow-up type: In-patient    Theodoro Kalata 04/25/2019, 4:26 AM

## 2019-04-25 NOTE — Lactation Note (Signed)
This note was copied from a baby's chart. Lactation Consultation Note  Patient Name: Tanya Fisher Today's Date: 04/25/2019 Reason for consult: Follow-up assessment;Maternal endocrine disorder;Term Type of Endocrine Disorder?: Diabetes  LC in to visit with P3 Mom of term baby at 14 hrs old.  Mom GDM and is experienced breastfeeding her first child, second one had tongue restrictions and she stopped breastfeeding due to pain.  Baby sleeping in crib while Mom is resting.  Baby is breastfeeding alternating with formula by bottle (10-36 ml).  Encouraged Mom to offer breast before offering formula.  Mom nodded in agreement.   Encouraged STS as much as possible.  Mom denies any questions currently.  To call for help prn.  Consult Status Consult Status: Follow-up Date: 04/26/19 Follow-up type: In-patient    Broadus John 04/25/2019, 6:42 PM

## 2019-04-26 MED ORDER — OXYCODONE HCL 5 MG PO TABS: 5.0000 mg | ORAL_TABLET | ORAL | 0 refills | Status: DC | PRN

## 2019-04-26 MED ORDER — ACETAMINOPHEN 500 MG PO TABS: 1000.0000 mg | ORAL_TABLET | Freq: Four times a day (QID) | ORAL | 0 refills | Status: DC

## 2019-04-26 MED ORDER — IBUPROFEN 800 MG PO TABS: 800.0000 mg | ORAL_TABLET | Freq: Four times a day (QID) | ORAL | 0 refills | Status: DC

## 2019-04-26 NOTE — Lactation Note (Signed)
This note was copied from a baby's chart. Lactation Consultation Note  Patient Name: Tanya Fisher Today's Date: 04/26/2019 Reason for consult: Follow-up assessment;Maternal endocrine disorder;Term;Infant weight loss Type of Endocrine Disorder?: Diabetes(GDM)  9 hours old FT female who is partially BF and formula fed by her mother, she's a P3. Mom and baby are going home today, baby is at 5% weight loss. Reviewed discharge instructions, engorgement prevention and treatment, treatment/prevention of sore nipples and red flags on when to call baby's pediatrician.  Mom was worried about her baby having a tongue tied like her second child, MD came to the room right before [name] left; communicated with her about mother's concerns and she said she was going to check baby for any possible oral restrictions. Mom stated she was getting sore but both breasts/nipples looked intact upon examination, maybe slightly redden. Instructed mom to hand express and use her own colostrum as the # 1 remedy for sore nipples, she did and demonstrated teach back.  Dad present and holding baby. Mom will try nursing baby prior offering any formula. Parents reported all questions and concerns were answered, they're both aware of [name] services and will call PRN.  Maternal Data    Feeding    LATCH Score                   Interventions Interventions: Breast feeding basics reviewed;Hand express  Lactation Tools Discussed/Used     Consult Status Consult Status: Complete Date: 04/26/19 Follow-up type: Call as needed    [name] 04/26/2019, 10:11 AM

## 2019-04-26 NOTE — Discharge Instructions (Signed)
Postpartum Care After Cesarean Delivery °This sheet gives you information about how to care for yourself from the time you deliver your baby to up to 6-12 weeks after delivery (postpartum period). Your health care provider may also give you more specific instructions. If you have problems or questions, contact your health care provider. °Follow these instructions at home: °Medicines °· Take over-the-counter and prescription medicines only as told by your health care provider. °· If you were prescribed an antibiotic medicine, take it as told by your health care provider. Do not stop taking the antibiotic even if you start to feel better. °· Ask your health care provider if the medicine prescribed to you: °? Requires you to avoid driving or using heavy machinery. °? Can cause constipation. You may need to take actions to prevent or treat constipation, such as: °§ Drink enough fluid to keep your urine pale yellow. °§ Take over-the-counter or prescription medicines. °§ Eat foods that are high in fiber, such as beans, whole grains, and fresh fruits and vegetables. °§ Limit foods that are high in fat and processed sugars, such as fried or sweet foods. °Activity °· Gradually return to your normal activities as told by your health care provider. °· Avoid activities that take a lot of effort and energy (are strenuous) until approved by your health care provider. Walking at a slow to moderate pace is usually safe. Ask your health care provider what activities are safe for you. °? Do not lift anything that is heavier than your baby or 10 lb (4.5 kg) as told by your health care provider. °? Do not vacuum, climb stairs, or drive a car for as long as told by your health care provider. °· If possible, have someone help you at home until you are able to do your usual activities yourself. °· Rest as much as possible. Try to rest or take naps while your baby is sleeping. °Vaginal bleeding °· It is normal to have vaginal bleeding  (lochia) after delivery. Wear a sanitary pad to absorb vaginal bleeding and discharge. °? During the first week after delivery, the amount and appearance of lochia is often similar to a menstrual period. °? Over the next few weeks, it will gradually decrease to a dry, yellow-brown discharge. °? For most women, lochia stops completely by 4-6 weeks after delivery. Vaginal bleeding can vary from woman to woman. °· Change your sanitary pads frequently. Watch for any changes in your flow, such as: °? A sudden increase in volume. °? A change in color. °? Large blood clots. °· If you pass a blood clot, save it and call your health care provider to discuss. Do not flush blood clots down the toilet before you get instructions from your health care provider. °· Do not use tampons or douches until your health care provider says this is safe. °· If you are not breastfeeding, your period should return 6-8 weeks after delivery. If you are breastfeeding, your period may return anytime between 8 weeks after delivery and the time that you stop breastfeeding. °Perineal care ° °· If your C-section (Cesarean section) was unplanned, and you were allowed to labor and push before delivery, you may have pain, swelling, and discomfort of the tissue between your vaginal opening and your anus (perineum). You may also have an incision in the tissue (episiotomy) or the tissue may have torn during delivery. Follow these instructions as told by your health care provider: °? Keep your perineum clean and dry as told by   your health care provider. Use medicated pads and pain-relieving sprays and creams as directed. °? If you have an episiotomy or vaginal tear, check the area every day for signs of infection. Check for: °§ Redness, swelling, or pain. °§ Fluid or blood. °§ Warmth. °§ Pus or a bad smell. °? You may be given a squirt bottle to use instead of wiping to clean the perineum area after you go to the bathroom. As you start healing, you may use  the squirt bottle before wiping yourself. Make sure to wipe gently. °? To relieve pain caused by an episiotomy, vaginal tear, or hemorrhoids, try taking a warm sitz bath 2-3 times a day. A sitz bath is a warm water bath that is taken while you are sitting down. The water should only come up to your hips and should cover your buttocks. °Breast care °· Within the first few days after delivery, your breasts may feel heavy, full, and uncomfortable (breast engorgement). You may also have milk leaking from your breasts. Your health care provider can suggest ways to help relieve breast discomfort. Breast engorgement should go away within a few days. °· If you are breastfeeding: °? Wear a bra that supports your breasts and fits you well. °? Keep your nipples clean and dry. Apply creams and ointments as told by your health care provider. °? You may need to use breast pads to absorb milk leakage. °? You may have uterine contractions every time you breastfeed for several weeks after delivery. Uterine contractions help your uterus return to its normal size. °? If you have any problems with breastfeeding, work with your health care provider or a lactation consultant. °· If you are not breastfeeding: °? Avoid touching your breasts as this can make your breasts produce more milk. °? Wear a well-fitting bra and use cold packs to help with swelling. °? Do not squeeze out (express) milk. This causes you to make more milk. °Intimacy and sexuality °· Ask your health care provider when you can engage in sexual activity. This may depend on your: °? Risk of infection. °? Healing rate. °? Comfort and desire to engage in sexual activity. °· You are able to get pregnant after delivery, even if you have not had your period. If desired, talk with your health care provider about methods of family planning or birth control (contraception). °Lifestyle °· Do not use any products that contain nicotine or tobacco, such as cigarettes, e-cigarettes,  and chewing tobacco. If you need help quitting, ask your health care provider. °· Do not drink alcohol, especially if you are breastfeeding. °Eating and drinking ° °· Drink enough fluid to keep your urine pale yellow. °· Eat high-fiber foods every day. These may help prevent or relieve constipation. High-fiber foods include: °? Whole grain cereals and breads. °? Brown rice. °? Beans. °? Fresh fruits and vegetables. °· Take your prenatal vitamins until your postpartum checkup or until your health care provider tells you it is okay to stop. °General instructions °· Keep all follow-up visits for you and your baby as told by your health care provider. Most women visit their health care provider for a postpartum checkup within the first 3-6 weeks after delivery. °Contact a health care provider if you: °· Feel unable to cope with the changes that a new baby brings to your life, and these feelings do not go away. °· Feel unusually sad or worried. °· Have breasts that are painful, hard, or turn red. °· Have a fever. °·   Have trouble holding urine or keeping urine from leaking.  Have little or no interest in activities you used to enjoy.  Have not breastfed at all and you have not had a menstrual period for 12 weeks after delivery.  Have stopped breastfeeding and you have not had a menstrual period for 12 weeks after you stopped breastfeeding.  Have questions about caring for yourself or your baby.  Pass a blood clot from your vagina. Get help right away if you:  Have chest pain.  Have difficulty breathing.  Have sudden, severe leg pain.  Have severe pain or cramping in your abdomen.  Bleed from your vagina so much that you fill more than one sanitary pad in one hour. Bleeding should not be heavier than your heaviest period.  Develop a severe headache.  Faint.  Have blurred vision or spots in your vision.  Have a bad-smelling vaginal discharge.  Have thoughts about hurting yourself or your  baby. If you ever feel like you may hurt yourself or others, or have thoughts about taking your own life, get help right away. You can go to your nearest emergency department or call:  Your local emergency services (911 in the [place]  A suicide crisis helpline, such as the National Suicide Prevention Lifeline at 787 736 0269. This is open 24 hours a day. Summary  The period of time from when you deliver your baby to up to 6-12 weeks after delivery is called the postpartum period.  Gradually return to your normal activities as told by your health care provider.  Keep all follow-up visits for you and your baby as told by your health care provider. This information is not intended to replace advice given to you by your health care provider. Make sure you discuss any questions you have with your health care provider. Document Released: 06/18/2000 Document Revised: 02/08/2018 Document Reviewed: 02/08/2018 Elsevier Patient Education  2020 [employer].   Intrauterine Device Information An intrauterine device (IUD) is a medical device that is inserted in the uterus to prevent pregnancy. It is a small, T-shaped device that has one or two nylon strings hanging down from it. The strings hang out of the lower part of the uterus (cervix) to allow for future IUD removal. There are two types of IUDs available:  Hormone IUD. This type of IUD is made of plastic and contains the hormone progestin (synthetic progesterone). A hormone IUD may last 3-5 years.  Copper IUD. This type of IUD has copper wire wrapped around it. A copper IUD may last up to 10 years. How is an IUD inserted? An IUD is inserted through the vagina and placed into the uterus with a minor medical procedure. The exact procedure for IUD insertion may vary among health care providers and hospitals. How does an IUD work? Synthetic progesterone in a hormonal IUD prevents pregnancy by:  Thickening cervical mucus to prevent sperm from  entering the uterus.  Thinning the uterine lining to prevent a fertilized egg from being implanted there. Copper in a copper IUD prevents pregnancy by making the uterus and fallopian tubes produce a fluid that kills sperm. What are the advantages of an IUD? Advantages of either type of IUD  It is highly effective in preventing pregnancy.  It is reversible. You can become pregnant shortly after the IUD is removed.  It is low-maintenance and can stay in place for a long time.  There are no estrogen-related side effects.  It can be used when breastfeeding.  It is  not associated with weight gain.  It can be inserted right after childbirth, an abortion, or a miscarriage. Advantages of a hormone IUD  If it is inserted within 7 days of your period starting, it works right after it is inserted. If the hormone IUD is inserted at any other time in your cycle, you will need to use a backup method of birth control for 7 days after insertion.  It can make menstrual periods lighter.  It can reduce menstrual cramping.  It can be used for 3-5 years. Advantages of a copper IUD  It works right after it is inserted.  It can be used as a form of emergency birth control if it is inserted within 5 days after having unprotected sex.  It does not interfere with your body's natural hormones.  It can be used for 10 years. What are the disadvantages of an IUD?  An IUD may cause irregular menstrual bleeding for a period of time after insertion.  You may have pain during insertion and have cramping and vaginal bleeding after insertion.  An IUD may cut the uterus (uterine perforation) when it is inserted. This is rare.  An IUD may cause pelvic inflammatory disease (PID), which is an infection in the uterus and fallopian tubes. This is rare, and it usually happens during the first 20 days after the IUD is inserted.  A copper IUD can make your menstrual flow heavier and more painful. How is an IUD  removed?  You will lie on your back with your knees bent and your feet in footrests (stirrups).  A device will be inserted into your vagina to spread apart the vaginal walls (speculum). This will allow your health care provider to see the strings attached to the IUD.  Your health care provider will use a small instrument (forceps) to grasp the IUD strings and pull firmly until the IUD is removed. You may have some discomfort when the IUD is removed. Your health care provider may recommend taking over-the-counter pain relievers, such as ibuprofen, before the procedure. You may also have minor spotting for a few days after the procedure. The exact procedure for IUD removal may vary among health care providers and hospitals. Is the IUD right for me? Your health care provider will make sure you are a good candidate for an IUD and will discuss the advantages, disadvantages, and possible side effects with you. Summary  An intrauterine device (IUD) is a medical device that is inserted in the uterus to prevent pregnancy. It is a small, T-shaped device that has one or two nylon strings hanging down from it.  A hormone IUD contains the hormone progestin (synthetic progesterone). A copper IUD has copper wire wrapped around it.  Synthetic progesterone in a hormone IUD prevents pregnancy by thickening cervical mucus and thinning the walls of the uterus. Copper in a copper IUD prevents pregnancy by making the uterus and fallopian tubes produce a fluid that kills sperm.  A hormone IUD can be left in place for 3-5 years. A copper IUD can be left in place for up to 10 years.  An IUD is inserted and removed by a health care provider. You may feel some pain during insertion and removal. Your health care provider may recommend taking over-the-counter pain medicine, such as ibuprofen, before an IUD procedure. This information is not intended to replace advice given to you by your health care provider. Make sure you  discuss any questions you have with your health care provider.  Document Released: 05/25/2004 Document Revised: 06/03/2017 Document Reviewed: 07/20/2016 Elsevier Patient Education  2020 [employer]

## 2019-05-02 IMAGING — US US MFM FETAL NUCHAL TRANSLUCENCY
1 series · 15 of 28 positions shown · non-contrast
Comparison: none

[Series 1: us mfm fetal nuchal translucency · 41 acquisitions, 15 frames shown]
[im 1/41]
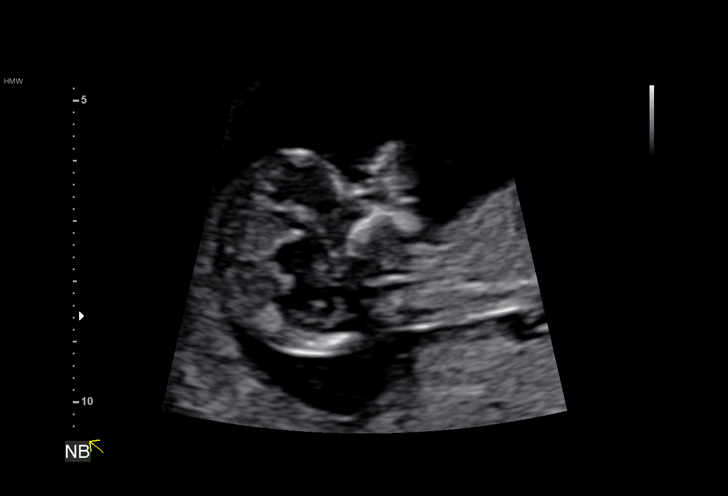
[im 3/41]
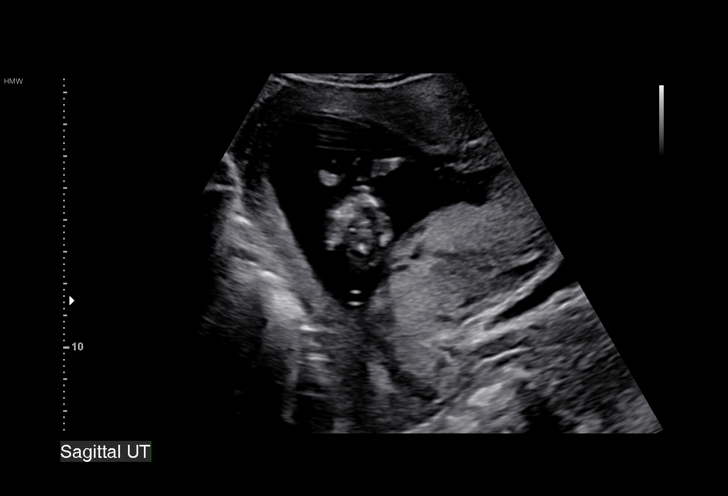
[im 6/41]
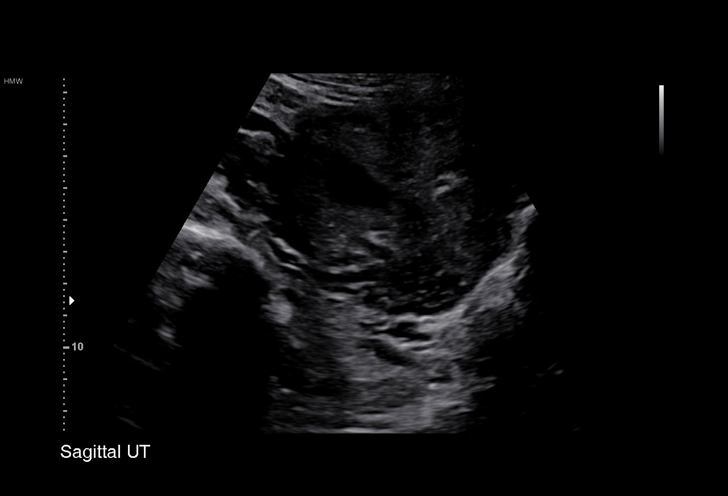
[im 9/41]
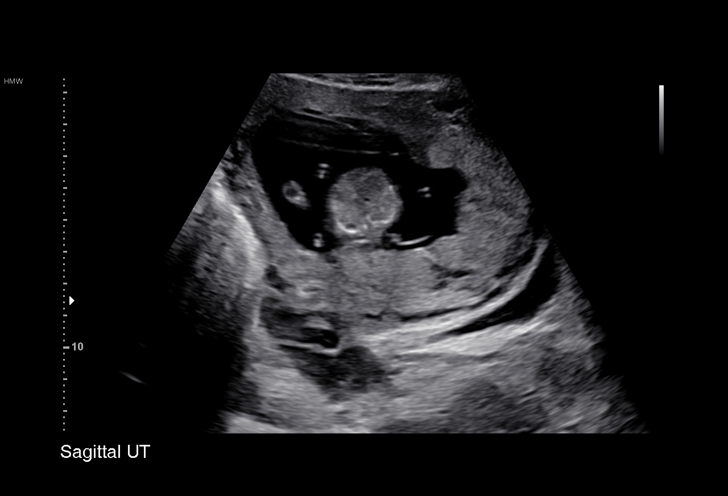
[im 12/41]
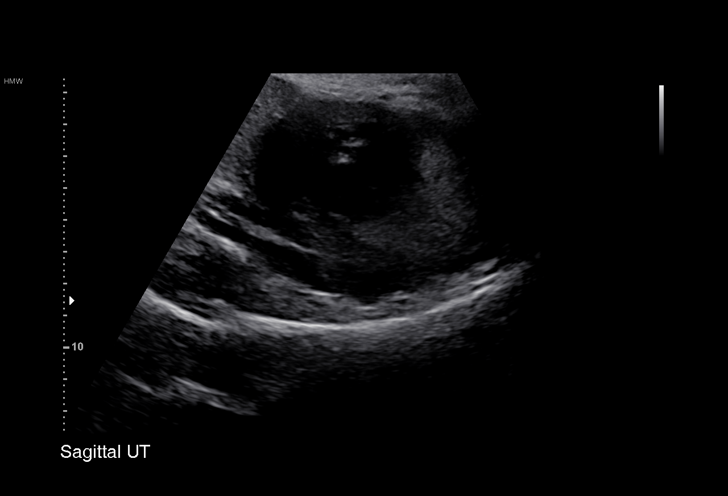
[im 15/41]
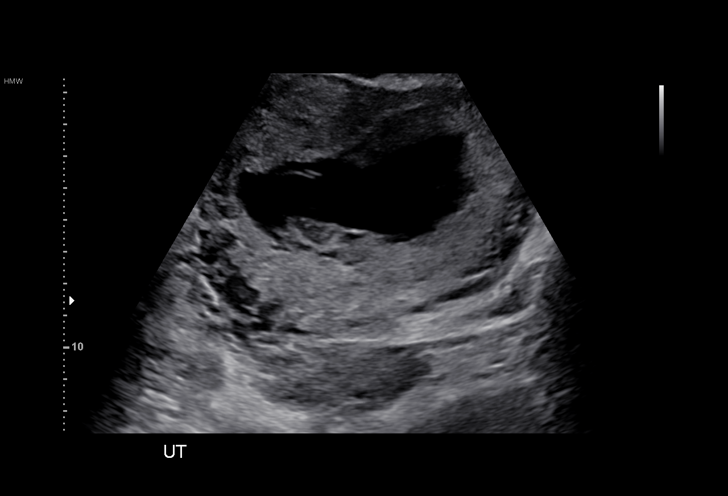
[im 18/41]
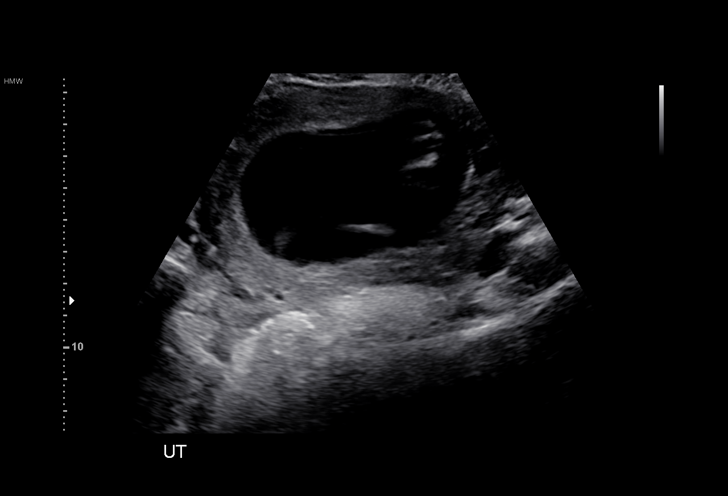
[im 21/41]
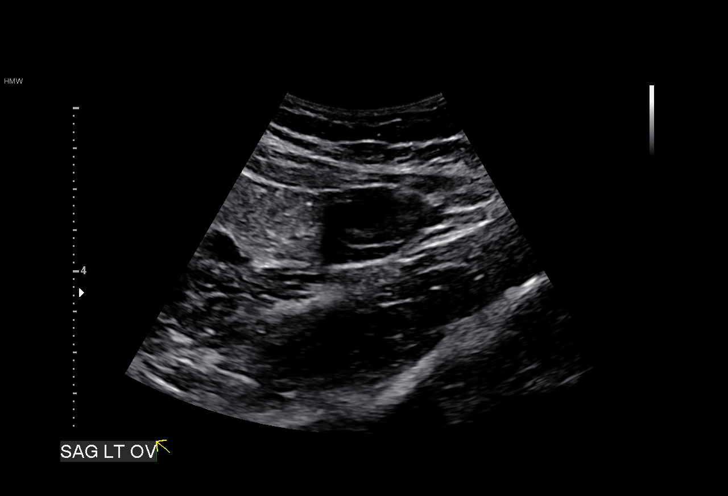
[im 23/41]
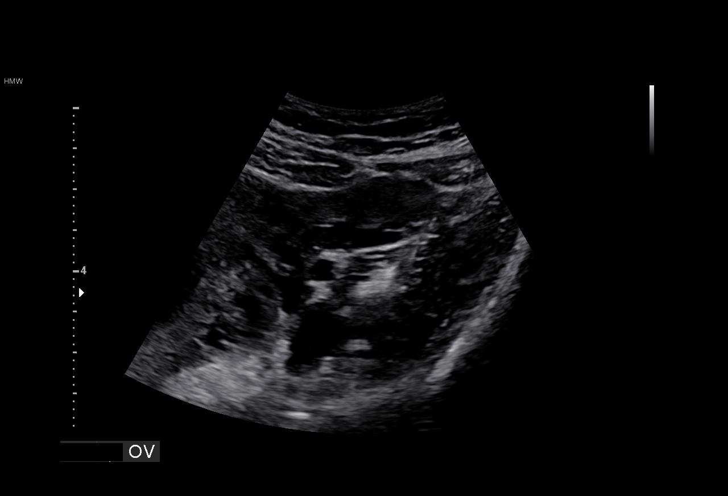
[im 26/41]
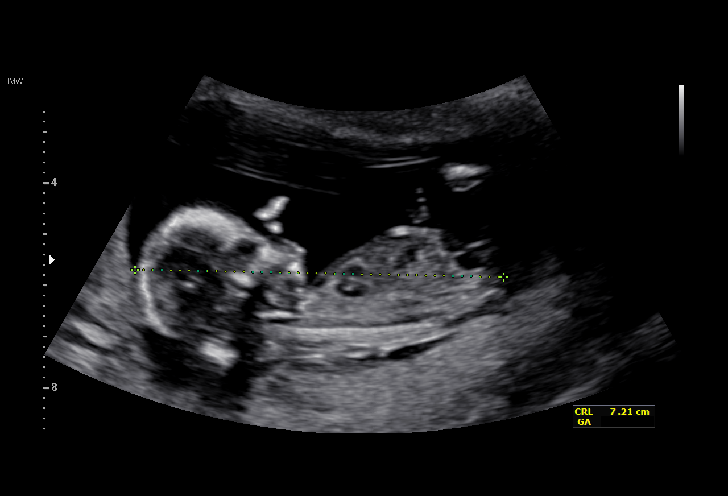
[im 29/41]
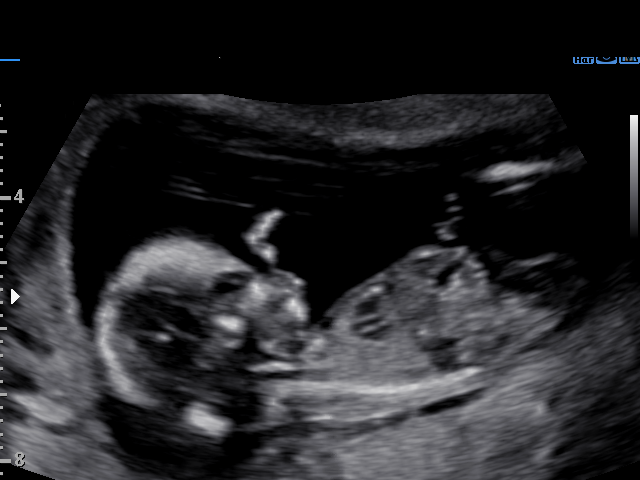
[im 32/41]
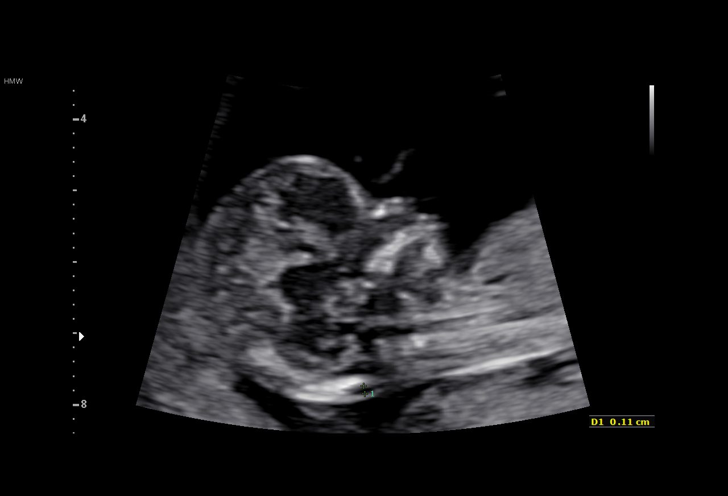
[im 35/41]
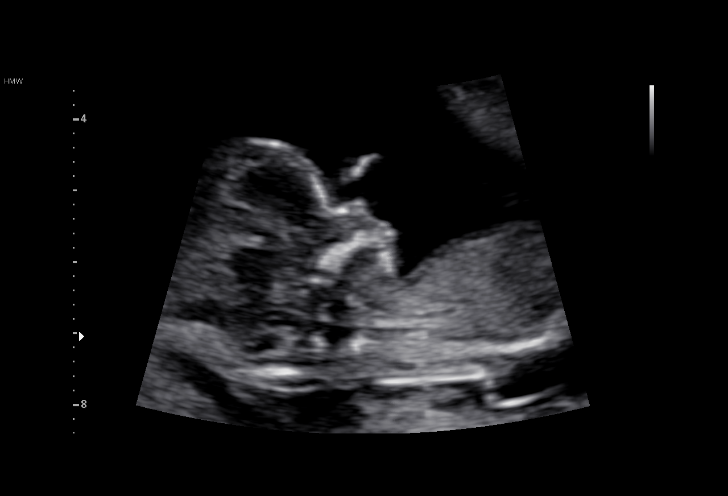
[im 38/41]
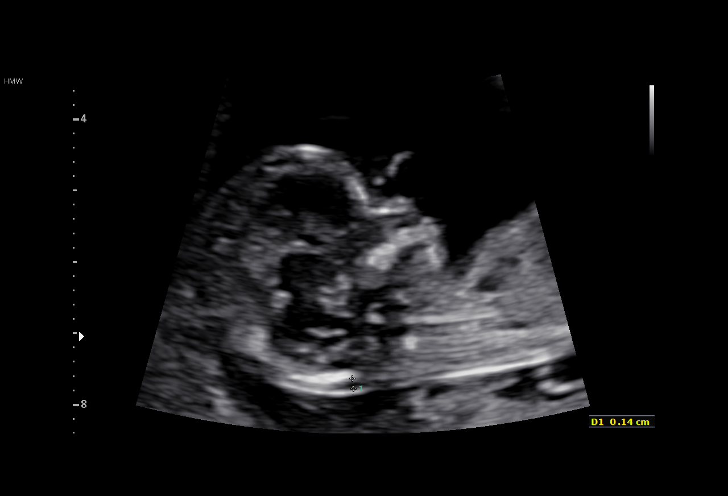
[im 41/41]
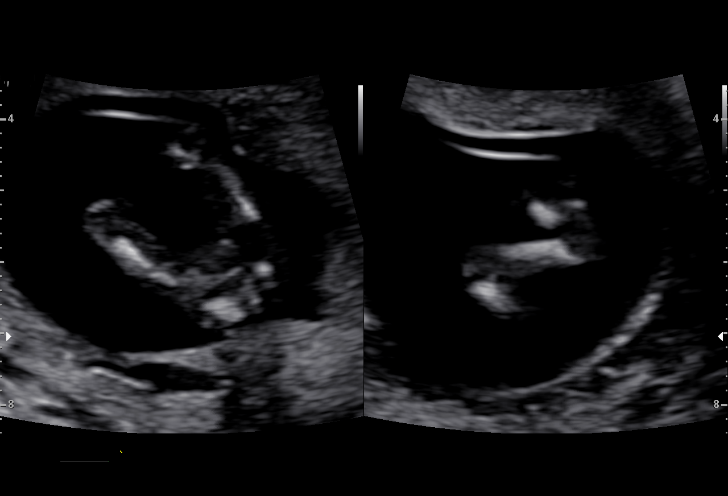

[15 of 28 positions shown; findings below may reference images not displayed]

OB/Gyn Clinic

TRANSLUCENCY

Indications

13 weeks gestation of pregnancy
Encounter for nuchal translucency
Previous cesarean delivery, antepartum
Deep vein thrombosis (DVT)
OB History

Gravidity:    3         Term:   1        Prem:   0        SAB:   0
TOP:          1       Ectopic:  0        Living: 1
Fetal Evaluation

Num Of Fetuses:     1
Preg. Location:     Intrauterine
Gest. Sac:          Intrauterine
Yolk Sac:           Not visualized
Fetal Pole:         Visualized
Fetal Heart         150
Rate(bpm):
Cardiac Activity:   Observed
Presentation:       Variable
Placenta:           Anterior

Amniotic Fluid
AFI FV:      Subjectively within normal limits
Biometry

CRL:      73.4  mm     G. Age:  13w 1d                  EDD:   12/01/17
Gestational Age

LMP:           12w 6d        Date:  02/26/17                 EDD:   12/03/17
Best:          13w 2d     Det. By:  Early Ultrasound         EDD:   11/30/17
(04/29/17)
1st Trimester Genetic Sonogram Screening

CRL:            73.4  mm    G. Age:   13w 1d                 EDD:   12/01/17
Nuc Trans:       1.5  mm

Nasal Bone:                 Present
Anatomy

Choroid Plexus:        Appears normal         Upper Extremities:      Visualized
Thoracic:              Appears normal         Lower Extremities:      Visualized
Stomach:               Appears normal, left
sided
Cervix Uterus Adnexa

Cervix
Normal appearance by transabdominal scan.

Uterus
No abnormality visualized.

Left Ovary
No adnexal mass visualized.

Right Ovary
No adnexal mass visualized.

Cul De Sac:   No free fluid seen.

Adnexa:       No abnormality visualized.
Impression

IUP at 13+2 weeks here for first trimester screening
Normal fetal movement and cardiac activity
Normal amniotic fluid
Normal placentation
Normal fetal morphology
CRL confirms menstrual dating
Nuchal translucency measures 1.5 mm
Recommendations

Serum analytes to be drawn today
Recommend anatomic survey in 6-8 weeks

## 2019-05-08 ENCOUNTER — Ambulatory Visit: Payer: Medicaid Other

## 2019-05-15 ENCOUNTER — Telehealth: Payer: Self-pay | Admitting: General Practice

## 2019-05-15 NOTE — Telephone Encounter (Signed)
Patient called and left message on nurse voicemail line stating she had a c-section recently on 10/20 and has noticed there is a blue string coming out of her private area. Per chart review, patient had IUD placed in OR.  Called patient, no answer- left message to call us back or check her mychart account.

## 2019-05-21 ENCOUNTER — Telehealth: Payer: Self-pay | Admitting: Student

## 2019-05-21 NOTE — Telephone Encounter (Signed)
Spoke to patient about her appointment on 11/17 @ 9:35. Patient instructed to wear a face mask for the entire appointment and no visitors are allowed with her during the visit. Patient screened for covid symptoms and denied having any °

## 2019-05-22 ENCOUNTER — Encounter: Payer: Self-pay | Admitting: Student

## 2019-05-22 ENCOUNTER — Ambulatory Visit: Payer: Medicaid Other | Admitting: Student

## 2019-05-22 ENCOUNTER — Telehealth: Payer: Self-pay | Admitting: General Practice

## 2019-05-22 DIAGNOSIS — A6000 Herpesviral infection of urogenital system, unspecified: Secondary | ICD-10-CM

## 2019-05-22 MED ORDER — VALACYCLOVIR HCL 1 G PO TABS: 1000.0000 mg | ORAL_TABLET | Freq: Two times a day (BID) | ORAL | 3 refills | Status: AC

## 2019-05-22 NOTE — Telephone Encounter (Signed)
Patient called and left message on nurse voicemail line stating she is having an HSV outbreak & needs a Rx called in.  Obtained Rx for Valtrex 1000mg  BID x 5 days with refills from [facility].  Called patient, no answer- left message stating we are trying to reach you to return your phone call, please call us back. Will send mychart message.

## 2019-06-04 ENCOUNTER — Other Ambulatory Visit: Payer: Self-pay | Admitting: *Deleted

## 2019-06-04 DIAGNOSIS — O24429 Gestational diabetes mellitus in childbirth, unspecified control: Secondary | ICD-10-CM

## 2019-06-05 ENCOUNTER — Ambulatory Visit: Payer: 59 | Admitting: Advanced Practice Midwife

## 2019-06-05 ENCOUNTER — Telehealth: Payer: Self-pay | Admitting: Advanced Practice Midwife

## 2019-06-05 ENCOUNTER — Encounter: Payer: Self-pay | Admitting: Advanced Practice Midwife

## 2019-06-05 ENCOUNTER — Other Ambulatory Visit: Payer: Medicaid Other

## 2019-06-05 NOTE — Telephone Encounter (Signed)
Attempted to contact patient in regards to her missed postpartum appointment. No answer, left voicemail for patient to give the office a call back to be rescheduled. No show letter mailed

## 2019-06-18 ENCOUNTER — Encounter: Payer: Self-pay | Admitting: Family Medicine

## 2019-10-11 ENCOUNTER — Other Ambulatory Visit (HOSPITAL_COMMUNITY)
Admission: RE | Admit: 2019-10-11 | Discharge: 2019-10-11 | Disposition: A | Discharge status: Home / Self Care | Payer: 59 | Source: Ambulatory Visit | Attending: Obstetrics and Gynecology | Admitting: Obstetrics and Gynecology

## 2019-10-11 ENCOUNTER — Ambulatory Visit (INDEPENDENT_AMBULATORY_CARE_PROVIDER_SITE_OTHER): Payer: 59 | Admitting: Obstetrics and Gynecology

## 2019-10-11 ENCOUNTER — Encounter: Payer: Self-pay | Admitting: Obstetrics and Gynecology

## 2019-10-11 ENCOUNTER — Other Ambulatory Visit: Payer: Self-pay

## 2019-10-11 VITALS — BP 144/80 | HR 91 | Wt 179.9 lb

## 2019-10-11 DIAGNOSIS — N898 Other specified noninflammatory disorders of vagina: Secondary | ICD-10-CM | POA: Insufficient documentation

## 2019-10-11 DIAGNOSIS — B009 Herpesviral infection, unspecified: Secondary | ICD-10-CM

## 2019-10-11 MED ORDER — VALACYCLOVIR HCL 500 MG PO TABS: 500.0000 mg | ORAL_TABLET | Freq: Every day | ORAL | 3 refills | Status: DC

## 2019-10-11 NOTE — Progress Notes (Signed)
GYNECOLOGY ANNUAL PREVENTATIVE CARE ENCOUNTER NOTE  History:     Tanya Fisher is a 24 y.o. [phone] female here for STI testing and refill on valtrex. Has outbreaks monthly with cycle; monthly outbreaks.  Denies abnormal vaginal bleeding, discharge, pelvic pain, problems with intercourse or other gynecologic concerns.  States she called for a refill and was told she needed to come in for a refill.   Gynecologic History No LMP recorded. Contraception: none- Declines all BC  Last Pap: 2018. Results were: normal with negative HPV   Obstetric History OB History  Gravida Para Term Preterm AB Living  4 3 3  0 1 3  SAB TAB Ectopic Multiple Live Births  0 1 0 0 3    # Outcome Date GA Lbr Len/2nd Weight Sex Delivery Anes PTL Lv  4 Term 04/24/19 [redacted]w[redacted]d  7 lb 10.9 oz (3.485 kg) F CS-LTranv Spinal  LIV  3 Term 12/03/17 [redacted]w[redacted]d 06:31 / 00:14 7 lb 3.7 oz (3.28 kg) F VBAC Local  LIV  2 TAB 07/2016          1 Term 12/15/15 [redacted]w[redacted]d  6 lb 9 oz (2.977 kg) M CS-Unspec None N LIV    Past Medical History:  Diagnosis Date  . Asthma   . DVT (deep vein thrombosis) in pregnancy   . Gestational diabetes     Past Surgical History:  Procedure Laterality Date  . CESAREAN SECTION    . CESAREAN SECTION N/A 04/24/2019   Procedure: CESAREAN SECTION;  Surgeon: Truett Mainland, DO;  Location: [facility];  Service: Obstetrics;  Laterality: N/A;  . DILATION AND EVACUATION     Therapeutic abortion 14 wks    Current Outpatient Medications on File Prior to Visit  Medication Sig Dispense Refill  . acetaminophen (TYLENOL) 500 MG tablet Take 2 tablets (1,000 mg total) by mouth every 6 (six) hours. 30 tablet 0  . enoxaparin (LOVENOX) 40 MG/0.4ML injection Inject 0.4 mLs (40 mg total) into the skin daily. 10 mL 12  . ibuprofen (ADVIL) 800 MG tablet Take 1 tablet (800 mg total) by mouth every 6 (six) hours. 30 tablet 0  . iron polysaccharides (NIFEREX) 150 MG capsule Take 1 capsule (150 mg total) by mouth daily.  Recheck iron labs with PCP/ObGyn in 3 months and adjust dose 60 capsule 3  . oxyCODONE (OXY IR/ROXICODONE) 5 MG immediate release tablet Take 1 tablet (5 mg total) by mouth every 4 (four) hours as needed for moderate pain. 20 tablet 0  . Prenatal Vit-Fe Fumarate-FA (PRENATAL MULTIVITAMIN) TABS tablet Take 1 tablet by mouth daily.      No current facility-administered medications on file prior to visit.    No Known Allergies  Social History:  reports that she quit smoking about 4 years ago. She has never used smokeless tobacco. She reports that she does not drink alcohol or use drugs.  Family History  Problem Relation Age of Onset  . Hypertension Mother   . Diabetes Father   . Hypertension Father     The following portions of the patient's history were reviewed and updated as appropriate: allergies, current medications, past family history, past medical history, past social history, past surgical history and problem list.  Review of Systems Pertinent items noted in HPI and remainder of comprehensive ROS otherwise negative.  Physical Exam:  BP (!) 144/80   Pulse 91   Wt 179 lb 14.4 oz (81.6 kg)   BMI 30.88 kg/m  CONSTITUTIONAL: Well-developed, well-nourished  female in no acute distress.  HENT:  Normocephalic, atraumatic, External right and left ear normal. Oropharynx is clear and moist EYES: Conjunctivae and EOM are normal. Pupils are equal, round, and reactive to light. No scleral icterus.  SKIN: Skin is warm and dry.  MUSCULOSKELETAL: Normal range of motion. NEUROLOGIC: Alert and oriented to person, place, and time. Marland Kitchen  PSYCHIATRIC: Normal mood and affect. Normal behavior.   Assessment and Plan:    1. Vaginal itching  - HIV Antibody (routine testing w rflx) - RPR - Hepatitis C antibody - Hepatitis B surface antigen - Cervicovaginal ancillary only( [place]) - Urine Culture - Scheduled Pap in the next 1-2 years.    Routine preventative health maintenance measures  emphasized. Please refer to After Visit Summary for other counseling recommendations.       Lorah Kalina, Harolyn Rutherford, NP [facility] [employer], [facility]

## 2019-10-11 NOTE — Progress Notes (Signed)
Needs Valtrex is currently going through a outbreak for 5 days and was denied a Rx until she came in. Wants all sti testing. Pain with urination and left side pain

## 2019-10-12 LAB — CERVICOVAGINAL ANCILLARY ONLY
Bacterial Vaginitis (gardnerella): NEGATIVE
Candida Glabrata: NEGATIVE
Candida Vaginitis: NEGATIVE
Chlamydia: NEGATIVE
Comment: NEGATIVE
Comment: NEGATIVE
Comment: NEGATIVE
Comment: NEGATIVE
Comment: NEGATIVE
Comment: NORMAL
Neisseria Gonorrhea: NEGATIVE
Trichomonas: NEGATIVE

## 2019-10-12 LAB — HEPATITIS B SURFACE ANTIGEN: Hepatitis B Surface Ag: NEGATIVE

## 2019-10-12 LAB — HEPATITIS C ANTIBODY: Hep C Virus Ab: <0.1 s/co ratio (ref 0.0–0.9)

## 2019-10-12 LAB — RPR: RPR Ser Ql: NONREACTIVE

## 2019-10-12 LAB — HIV ANTIBODY (ROUTINE TESTING W REFLEX): HIV Screen 4th Generation wRfx: NONREACTIVE

## 2019-10-13 LAB — URINE CULTURE

## 2019-12-05 ENCOUNTER — Emergency Department (HOSPITAL_BASED_OUTPATIENT_CLINIC_OR_DEPARTMENT_OTHER): Payer: 59

## 2019-12-05 ENCOUNTER — Encounter (HOSPITAL_BASED_OUTPATIENT_CLINIC_OR_DEPARTMENT_OTHER): Payer: Self-pay | Admitting: Emergency Medicine

## 2019-12-05 ENCOUNTER — Emergency Department (HOSPITAL_BASED_OUTPATIENT_CLINIC_OR_DEPARTMENT_OTHER)
Admission: EM | Admit: 2019-12-05 | Discharge: 2019-12-05 | Disposition: A | Discharge status: Home / Self Care | Payer: 59 | Attending: Emergency Medicine | Admitting: Emergency Medicine

## 2019-12-05 ENCOUNTER — Other Ambulatory Visit: Payer: Self-pay

## 2019-12-05 DIAGNOSIS — Y92410 Unspecified street and highway as the place of occurrence of the external cause: Secondary | ICD-10-CM | POA: Insufficient documentation

## 2019-12-05 DIAGNOSIS — Y998 Other external cause status: Secondary | ICD-10-CM | POA: Diagnosis not present

## 2019-12-05 DIAGNOSIS — R202 Paresthesia of skin: Secondary | ICD-10-CM | POA: Diagnosis not present

## 2019-12-05 DIAGNOSIS — J45909 Unspecified asthma, uncomplicated: Secondary | ICD-10-CM | POA: Insufficient documentation

## 2019-12-05 DIAGNOSIS — M25512 Pain in left shoulder: Secondary | ICD-10-CM | POA: Diagnosis not present

## 2019-12-05 DIAGNOSIS — M7918 Myalgia, other site: Secondary | ICD-10-CM | POA: Diagnosis not present

## 2019-12-05 DIAGNOSIS — Z79899 Other long term (current) drug therapy: Secondary | ICD-10-CM | POA: Insufficient documentation

## 2019-12-05 DIAGNOSIS — Y9389 Activity, other specified: Secondary | ICD-10-CM | POA: Diagnosis not present

## 2019-12-05 DIAGNOSIS — R079 Chest pain, unspecified: Secondary | ICD-10-CM | POA: Diagnosis present

## 2019-12-05 DIAGNOSIS — R200 Anesthesia of skin: Secondary | ICD-10-CM | POA: Diagnosis not present

## 2019-12-05 DIAGNOSIS — Z87891 Personal history of nicotine dependence: Secondary | ICD-10-CM | POA: Diagnosis not present

## 2019-12-05 MED ORDER — CYCLOBENZAPRINE HCL 10 MG PO TABS: 10.0000 mg | ORAL_TABLET | Freq: Two times a day (BID) | ORAL | 0 refills | Status: DC | PRN
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
## 2019-12-05 NOTE — Discharge Instructions (Signed)
Take 600 mg of Motrin as needed for pain up to 3 times a day, 1000 mg of Tylenol up to 4 times a day as needed for pain, use Flexeril as prescribed but do not mix with alcohol or drugs or heavy machinery or driving
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
## 2020-07-13 ENCOUNTER — Encounter (HOSPITAL_COMMUNITY): Payer: Self-pay

## 2020-07-13 ENCOUNTER — Emergency Department (HOSPITAL_COMMUNITY)
Admission: EM | Admit: 2020-07-13 | Discharge: 2020-07-13 | Disposition: A | Discharge status: Home / Self Care | Payer: 59 | Attending: Emergency Medicine | Admitting: Emergency Medicine

## 2020-07-13 ENCOUNTER — Other Ambulatory Visit: Payer: Self-pay

## 2020-07-13 DIAGNOSIS — Z5321 Procedure and treatment not carried out due to patient leaving prior to being seen by health care provider: Secondary | ICD-10-CM | POA: Insufficient documentation

## 2020-07-13 HISTORY — DX: Herpesviral infection, unspecified: B00.9
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
## 2020-07-13 NOTE — ED Notes (Signed)
Attempted triage, but pt would not get off of her telephone so placed in lobby until she would cooperate.

## 2020-07-13 NOTE — ED Notes (Signed)
Called patient to take back to a hall bed and no one responed

## 2020-07-13 NOTE — ED Provider Notes (Signed)
[facility]-EMERGENCY DEPT Provider Note   CSN: [id number] Arrival date & time: 07/13/20  0631     History Chief Complaint  Patient presents with  . Optician, dispensing  . Head Injury    Tanya Fisher is a 25 y.o. female.  HPI    patient was not seen by me. Please disregard this note.   Past Medical History:  Diagnosis Date  . Asthma   . DVT (deep vein thrombosis) in pregnancy   . Gestational diabetes   . Herpes     Patient Active Problem List   Diagnosis Date Noted  . Recurrent HSV (herpes simplex virus) 10/11/2019  . Vaginal itching 10/11/2019  . Status post cesarean section 04/24/2019  . History of cesarean delivery 04/09/2019  . History of herpes simplex infection 02/28/2019  . GDM (gestational diabetes mellitus) 02/28/2019  . Trichimoniasis 01/29/2019  . Substance abuse affecting pregnancy, antepartum 01/29/2019  . 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  . Supervision of high risk pregnancy, antepartum 04/29/2017  . DVT (deep vein thrombosis) in pregnancy 04/29/2017  . History of VBAC 04/29/2017    Past Surgical History:  Procedure Laterality Date  . CESAREAN SECTION    . CESAREAN SECTION N/A 04/24/2019   Procedure: CESAREAN SECTION;  Surgeon: Levie Heritage, DO;  Location: [facility] LD ORS;  Service: Obstetrics;  Laterality: N/A;  . DILATION AND EVACUATION     Therapeutic abortion 14 wks     OB History    Gravida  4   Para  3   Term  3   Preterm  0   AB  1   Living  3     SAB  0   IAB  1   Ectopic  0   Multiple  0   Live Births  3           Family History  Problem Relation Age of Onset  . Hypertension Mother   . Diabetes Father   . Hypertension Father     Social History   Tobacco Use  . Smoking status: Former Smoker    Quit date: 07/06/2015    Years since quitting: 5.0  . Smokeless tobacco: Never Used  Vaping Use  . Vaping Use: Never used  Substance Use Topics  . Alcohol use: No     Alcohol/week: 0.0 standard drinks  . Drug use: No    Home Medications Prior to Admission medications   Medication Sig Start Date End Date Taking? Authorizing Provider  cyclobenzaprine (FLEXERIL) 10 MG tablet Take 1 tablet (10 mg total) by mouth 2 (two) times daily as needed for muscle spasms. 12/05/19   Curatolo, Adam, DO  enoxaparin (LOVENOX) 40 MG/0.4ML injection Inject 0.4 mLs (40 mg total) into the skin daily. 01/29/19   Allie Bossier, MD  valACYclovir (VALTREX) 500 MG tablet Take 1 tablet (500 mg total) by mouth daily. 10/11/19   Rasch, Harolyn Rutherford, NP    Allergies    Patient has no known allergies.  Review of Systems   Review of Systems  Physical Exam Updated Vital Signs BP (!) 137/94 (BP Location: Left Arm)   Pulse 96   Temp 98.1 F (36.7 C) (Oral)   Resp 18   Ht 5\' 3"  (1.6 m)   Wt 84.4 kg   LMP 07/02/2020   SpO2 97%   BMI 32.95 kg/m   Physical Exam  ED Results / Procedures / Treatments   Labs (all labs ordered are listed,  but only abnormal results are displayed) Labs Reviewed - No data to display  EKG None  Radiology No results found.  Procedures Procedures (including critical care time)  Medications Ordered in ED Medications - No data to display  ED Course  I have reviewed the triage vital signs and the nursing notes.  Pertinent labs & imaging results that were available during my care of the patient were reviewed by me and considered in my medical decision making (see chart for details).    MDM Rules/Calculators/A&P                          BP (!) 137/94 (BP Location: Left Arm)   Pulse 96   Temp 98.1 F (36.7 C) (Oral)   Resp 18   Ht 5\' 3"  (1.6 m)   Wt 84.4 kg   LMP 07/02/2020   SpO2 97%   BMI 32.95 kg/m   Final Clinical Impression(s) / ED Diagnoses Final diagnoses:  None    Rx / DC Orders ED Discharge Orders    None       07/04/2020, PA-C 07/19/20 0504    07/21/20, MD 07/21/20 [phone]

## 2021-04-04 ENCOUNTER — Encounter (HOSPITAL_COMMUNITY): Payer: Self-pay

## 2021-04-04 ENCOUNTER — Emergency Department (HOSPITAL_COMMUNITY)
Admission: EM | Admit: 2021-04-04 | Discharge: 2021-04-04 | Disposition: A | Discharge status: Home / Self Care | Payer: Medicaid Other | Attending: Emergency Medicine | Admitting: Emergency Medicine

## 2021-04-04 ENCOUNTER — Other Ambulatory Visit: Payer: Self-pay

## 2021-04-04 DIAGNOSIS — F10920 Alcohol use, unspecified with intoxication, uncomplicated: Secondary | ICD-10-CM

## 2021-04-04 DIAGNOSIS — Z87891 Personal history of nicotine dependence: Secondary | ICD-10-CM | POA: Diagnosis not present

## 2021-04-04 DIAGNOSIS — S91311A Laceration without foreign body, right foot, initial encounter: Secondary | ICD-10-CM

## 2021-04-04 DIAGNOSIS — F10129 Alcohol abuse with intoxication, unspecified: Secondary | ICD-10-CM | POA: Insufficient documentation

## 2021-04-04 DIAGNOSIS — J45909 Unspecified asthma, uncomplicated: Secondary | ICD-10-CM | POA: Diagnosis not present

## 2021-04-04 DIAGNOSIS — W25XXXA Contact with sharp glass, initial encounter: Secondary | ICD-10-CM | POA: Diagnosis not present

## 2021-04-04 NOTE — ED Notes (Signed)
Pt refusing to allow staff to clean and dress wound on right foot. Pt states "I got bandages at home". Pt refusing to allow this RN to get discharge papers from PA. Pt ambulated self out of the ED with steady gait.

## 2021-04-04 NOTE — ED Triage Notes (Signed)
Pt arrived via POV, c/o laceration to right foot after cutting on glass climbing through window. Bleeding controlled. Last tetanus unknown. Pt also requesting STD testing, denies exposure or sx but states she wants to be tested while here.
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
## 2021-04-16 ENCOUNTER — Other Ambulatory Visit (HOSPITAL_COMMUNITY)
Admission: RE | Admit: 2021-04-16 | Discharge: 2021-04-16 | Disposition: A | Discharge status: Home / Self Care | Payer: Medicaid Other | Source: Ambulatory Visit | Attending: Family Medicine | Admitting: Family Medicine

## 2021-04-16 ENCOUNTER — Ambulatory Visit (INDEPENDENT_AMBULATORY_CARE_PROVIDER_SITE_OTHER): Payer: Medicaid Other

## 2021-04-16 ENCOUNTER — Other Ambulatory Visit: Payer: Self-pay

## 2021-04-16 VITALS — BP 120/90 | HR 56 | Wt 193.3 lb

## 2021-04-16 DIAGNOSIS — B009 Herpesviral infection, unspecified: Secondary | ICD-10-CM

## 2021-04-16 DIAGNOSIS — Z113 Encounter for screening for infections with a predominantly sexual mode of transmission: Secondary | ICD-10-CM

## 2021-04-16 DIAGNOSIS — Z3200 Encounter for pregnancy test, result unknown: Secondary | ICD-10-CM | POA: Diagnosis not present

## 2021-04-16 LAB — POCT PREGNANCY, URINE: Preg Test, Ur: NEGATIVE

## 2021-04-16 MED ORDER — VALACYCLOVIR HCL 1 G PO TABS: 1000.0000 mg | ORAL_TABLET | Freq: Every day | ORAL | 99 refills | Status: AC
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
## 2021-04-17 LAB — HIV ANTIBODY (ROUTINE TESTING W REFLEX): HIV Screen 4th Generation wRfx: NONREACTIVE

## 2021-04-17 LAB — CERVICOVAGINAL ANCILLARY ONLY
Chlamydia: NEGATIVE
Comment: NEGATIVE
Comment: NEGATIVE
Comment: NORMAL
Neisseria Gonorrhea: NEGATIVE
Trichomonas: NEGATIVE

## 2021-04-17 LAB — HEPATITIS B SURFACE ANTIGEN: Hepatitis B Surface Ag: NEGATIVE

## 2021-04-17 LAB — RPR: RPR Ser Ql: NONREACTIVE

## 2021-04-17 LAB — HEPATITIS C ANTIBODY: Hep C Virus Ab: <0.1 s/co ratio (ref 0.0–0.9)

## 2021-04-17 NOTE — Progress Notes (Signed)
Patient was assessed and managed by nursing staff during this encounter. I have reviewed the chart and agree with the documentation and plan.   Edd Arbour, MSN, CNM, [facility] 04/17/21 7:45 AM

## 2021-04-23 ENCOUNTER — Encounter: Payer: Self-pay | Admitting: *Deleted

## 2021-08-04 ENCOUNTER — Observation Stay (HOSPITAL_COMMUNITY)
Admission: EM | Admit: 2021-08-04 | Discharge: 2021-08-06 | Disposition: A | Discharge status: Home / Self Care | Payer: Medicaid Other | Attending: Family Medicine | Admitting: Family Medicine

## 2021-08-04 ENCOUNTER — Encounter (HOSPITAL_COMMUNITY): Payer: Self-pay | Admitting: Emergency Medicine

## 2021-08-04 ENCOUNTER — Emergency Department (HOSPITAL_COMMUNITY): Payer: Medicaid Other

## 2021-08-04 ENCOUNTER — Other Ambulatory Visit: Payer: Self-pay

## 2021-08-04 DIAGNOSIS — J45909 Unspecified asthma, uncomplicated: Secondary | ICD-10-CM | POA: Insufficient documentation

## 2021-08-04 DIAGNOSIS — Z20822 Contact with and (suspected) exposure to covid-19: Secondary | ICD-10-CM | POA: Insufficient documentation

## 2021-08-04 DIAGNOSIS — I2699 Other pulmonary embolism without acute cor pulmonale: Secondary | ICD-10-CM | POA: Diagnosis not present

## 2021-08-04 DIAGNOSIS — Z87891 Personal history of nicotine dependence: Secondary | ICD-10-CM | POA: Diagnosis not present

## 2021-08-04 DIAGNOSIS — D62 Acute posthemorrhagic anemia: Secondary | ICD-10-CM | POA: Insufficient documentation

## 2021-08-04 DIAGNOSIS — R072 Precordial pain: Secondary | ICD-10-CM | POA: Diagnosis present

## 2021-08-04 LAB — TROPONIN I (HIGH SENSITIVITY)
Troponin I (High Sensitivity): 11 ng/L (ref <18)
Troponin I (High Sensitivity): 19 ng/L — ABNORMAL HIGH (ref <18)
Troponin I (High Sensitivity): 25 ng/L — ABNORMAL HIGH (ref <18)
Troponin I (High Sensitivity): 20 ng/L — ABNORMAL HIGH (ref <18)

## 2021-08-04 LAB — CBC
HCT: 37 % (ref 36.0–46.0)
Hemoglobin: 11.6 g/dL — ABNORMAL LOW (ref 12.0–15.0)
MCH: 26.2 pg (ref 26.0–34.0)
MCHC: 31.4 g/dL (ref 30.0–36.0)
MCV: 83.5 fL (ref 80.0–100.0)
Platelets: 256 10*3/uL (ref 150–400)
RBC: 4.43 MIL/uL (ref 3.87–5.11)
RDW: 17.8 % — ABNORMAL HIGH (ref 11.5–15.5)
WBC: 6.4 10*3/uL (ref 4.0–10.5)
nRBC: 0 % (ref 0.0–0.2)

## 2021-08-04 LAB — RESP PANEL BY RT-PCR (FLU A&B, COVID) ARPGX2
Influenza A by PCR: NEGATIVE
Influenza B by PCR: NEGATIVE
SARS Coronavirus 2 by RT PCR: NEGATIVE

## 2021-08-04 LAB — BASIC METABOLIC PANEL
Anion gap: 8 (ref 5–15)
BUN: 6 mg/dL (ref 6–20)
CO2: 25 mmol/L (ref 22–32)
Calcium: 9.4 mg/dL (ref 8.9–10.3)
Chloride: 104 mmol/L (ref 98–111)
Creatinine, Ser: 1 mg/dL (ref 0.44–1.00)
GFR, Estimated: >60 mL/min (ref >60)
Glucose, Bld: 125 mg/dL — ABNORMAL HIGH (ref 70–99)
Potassium: 4 mmol/L (ref 3.5–5.1)
Sodium: 137 mmol/L (ref 135–145)

## 2021-08-04 LAB — I-STAT BETA HCG BLOOD, ED (MC, WL, AP ONLY): I-stat hCG, quantitative: <5 m[IU]/mL (ref <5)

## 2021-08-04 MED ORDER — ACETAMINOPHEN 325 MG PO TABS: 650.0000 mg | ORAL_TABLET | Freq: Four times a day (QID) | ORAL | Status: DC | PRN

## 2021-08-04 MED ORDER — HEPARIN (PORCINE) 25000 UT/250ML-% IV SOLN
1000.0000 [IU]/h | INTRAVENOUS | Status: DC
  Administered 2021-08-04: 19:00:00 1150 [IU]/h via INTRAVENOUS
  Filled 2021-08-04 (×2): qty 250

## 2021-08-04 MED ORDER — ALBUTEROL SULFATE (2.5 MG/3ML) 0.083% IN NEBU: 2.5000 mg | INHALATION_SOLUTION | RESPIRATORY_TRACT | Status: DC | PRN

## 2021-08-04 MED ORDER — HEPARIN BOLUS VIA INFUSION
4500.0000 [IU] | Freq: Once | INTRAVENOUS | Status: AC
  Administered 2021-08-04: 4500 [IU] via INTRAVENOUS
  Filled 2021-08-04: qty 4500

## 2021-08-04 MED ORDER — IOHEXOL 350 MG/ML SOLN
50.0000 mL | Freq: Once | INTRAVENOUS | Status: AC | PRN
  Administered 2021-08-04: 50 mL via INTRAVENOUS

## 2021-08-04 MED ORDER — ACETAMINOPHEN 650 MG RE SUPP: 650.0000 mg | Freq: Four times a day (QID) | RECTAL | Status: DC | PRN

## 2021-08-04 MED ORDER — ONDANSETRON HCL 4 MG/2ML IJ SOLN: 4.0000 mg | Freq: Four times a day (QID) | INTRAMUSCULAR | Status: DC | PRN

## 2021-08-04 MED ORDER — ONDANSETRON HCL 4 MG PO TABS: 4.0000 mg | ORAL_TABLET | Freq: Four times a day (QID) | ORAL | Status: DC | PRN

## 2021-08-04 NOTE — ED Provider Triage Note (Signed)
Emergency Medicine Provider Triage Evaluation Note  Tanya Fisher , a 26 y.o. female  was evaluated in triage.  Pt complains of central chest pain for 3 days.  Patient reports a productive cough for several days, and night sweats.  She shows me a picture of blood-tinged mucus that she coughed up the other night.  Review of Systems  Positive: Chest pain, cough, wheezing, productive cough, night sweats Negative: Nausea, vomiting, leg pain or swelling  Physical Exam  BP (!) 147/89 (BP Location: Right Arm)    Pulse 93    Temp 98.2 F (36.8 C) (Oral)    Resp 16    SpO2 97%  Gen:   Awake, no distress   Resp:  Normal effort  MSK:   Moves extremities without difficulty  Other:    Medical Decision Making  Medically screening exam initiated at 1:45 PM.  Appropriate orders placed.  Tanya Fisher was informed that the remainder of the evaluation will be completed by another provider, this initial triage assessment does not replace that evaluation, and the importance of remaining in the ED until their evaluation is complete.     Najae Filsaime T, PA-C 08/04/21 1346

## 2021-08-04 NOTE — ED Notes (Signed)
Patient given cup of water per request, family at bedside.  Patient denies further needs.

## 2021-08-04 NOTE — ED Provider Notes (Addendum)
[facility] EMERGENCY DEPARTMENT Provider Note   CSN: [id number] Arrival date & time: 08/04/21  1320     History  Chief Complaint  Patient presents with   Chest Pain    Tanya Fisher is a 26 y.o. female.  Pt has a history of dvt's in the past.  Pt reports she has had pain in her chest for 3 days   The history is provided by the patient. No language interpreter was used.  Chest Pain Pain location:  Substernal area Pain quality: aching   Pain radiates to:  Does not radiate Pain severity:  No pain Onset quality:  Gradual Duration:  3 days Timing:  Constant Progression:  Worsening Chronicity:  New Context: breathing   Relieved by:  Nothing Worsened by:  Nothing Ineffective treatments:  None tried Associated symptoms: no abdominal pain and no vomiting   Risk factors: not pregnant       Home Medications Prior to Admission medications   Medication Sig Start Date End Date Taking? Authorizing Provider  valACYclovir (VALTREX) 1000 MG tablet Take 1 tablet (1,000 mg total) by mouth daily. 04/16/21 04/16/22  Bernerd Limbo, CNM      Allergies    Patient has no known allergies.    Review of Systems   Review of Systems  Cardiovascular:  Positive for chest pain.  Gastrointestinal:  Negative for abdominal pain and vomiting.  All other systems reviewed and are negative.  Physical Exam Updated Vital Signs BP 139/84 (BP Location: Right Arm)    Pulse 68    Temp 98.2 F (36.8 C) (Oral)    Resp 18    LMP 08/03/2021    SpO2 100%  Physical Exam Vitals and nursing note reviewed.  Constitutional:      Appearance: She is well-developed.  HENT:     Head: Normocephalic.  Cardiovascular:     Rate and Rhythm: Normal rate and regular rhythm.     Heart sounds: Normal heart sounds.  Pulmonary:     Effort: Pulmonary effort is normal.     Breath sounds: Normal breath sounds.  Chest:     Chest wall: Tenderness present.  Abdominal:     General: Bowel sounds  are normal. There is no distension.     Palpations: Abdomen is soft.  Musculoskeletal:        General: Normal range of motion.     Cervical back: Normal range of motion.  Skin:    General: Skin is warm.  Neurological:     General: No focal deficit present.     Mental Status: She is alert and oriented to person, place, and time.    ED Results / Procedures / Treatments   Labs (all labs ordered are listed, but only abnormal results are displayed) Labs Reviewed  BASIC METABOLIC PANEL - Abnormal; Notable for the following components:      Result Value   Glucose, Bld 125 (*)    All other components within normal limits  CBC - Abnormal; Notable for the following components:   Hemoglobin 11.6 (*)    RDW 17.8 (*)    All other components within normal limits  I-STAT BETA HCG BLOOD, ED ([facility], [facility], [facility] ONLY)  TROPONIN I (HIGH SENSITIVITY)  TROPONIN I (HIGH SENSITIVITY)    EKG None  Radiology DG Chest 2 View  Result Date: 08/04/2021 CLINICAL DATA:  Chest pain EXAM: CHEST - 2 VIEW COMPARISON:  None. FINDINGS: The heart size and mediastinal contours are within normal  limits. Both lungs are clear. The visualized skeletal structures are unremarkable. IMPRESSION: No evidence of acute cardiopulmonary disease. Electronically Signed   By: Caprice Renshaw M.D.   On: 08/04/2021 14:42   CT Angio Chest PE W and/or Wo Contrast  Result Date: 08/04/2021 CLINICAL DATA:  Chest pain or SOB, pleurisy or effusion suspected; chest pain; dvt history EXAM: CT ANGIOGRAPHY CHEST WITH CONTRAST TECHNIQUE: Multidetector CT imaging of the chest was performed using the standard protocol during bolus administration of intravenous contrast. Multiplanar CT image reconstructions and MIPs were obtained to evaluate the vascular anatomy. RADIATION DOSE REDUCTION: This exam was performed according to the departmental dose-optimization program which includes automated exposure control, adjustment of the mA and/or kV according to patient  size and/or use of iterative reconstruction technique. CONTRAST:  3mL OMNIPAQUE IOHEXOL 350 MG/ML SOLN COMPARISON:  None. FINDINGS: Cardiovascular: Satisfactory opacification of the pulmonary arteries to the segmental level. Acute segmental pulmonary emboli are present in the right lower lobe. There is also some involvement of the lingula. Normal heart size. No pericardial effusion. Mediastinum/Nodes: No enlarged nodes. Included thyroid is unremarkable. Esophagus is unremarkable. Lungs/Pleura: No consolidation or mass. No pleural effusion or pneumothorax. Upper Abdomen: No acute abnormality. Musculoskeletal: No acute osseous abnormality. Review of the MIP images confirms the above findings. IMPRESSION: Acute right lower lobe segmental pulmonary emboli. Some segmental involvement of the lingula as well. These results were called by telephone at the time of interpretation on 08/04/2021 at 3:55 pm. Electronically Signed   By: Guadlupe Spanish M.D.   On: 08/04/2021 16:00    Procedures Procedures    Medications Ordered in ED Medications  iohexol (OMNIPAQUE) 350 MG/ML injection 50 mL (50 mLs Intravenous Contrast Given 08/04/21 1545)    ED Course/ Medical Decision Making/ A&P Clinical Course as of 08/04/21 1621  Tue Aug 04, 2021  1555 Called by radiology + for PE (no RHS)  [WF]    Clinical Course User Index [WF] Gailen Shelter, [place]                           Medical Decision Making Amount and/or Complexity of Data Reviewed External Data Reviewed: notes.    Details: notes from pregnancy in 2020 reviewed Labs: ordered. Decision-making details documented in ED Course.    Details: troponin is normal Radiology: ordered and independent interpretation performed. Decision-making details documented in ED Course.    Details: Ct shows pulmonary emboli ECG/medicine tests: ordered and independent interpretation performed. Discussion of management or test interpretation with external provider(s): Unassigned  medicine consulted for admission   Risk Prescription drug management. Parenteral controlled substances. Decision regarding hospitalization.            Final Clinical Impression(s) / ED Diagnoses Final diagnoses:  Other acute pulmonary embolism, unspecified whether acute cor pulmonale present [facility])    Rx / DC Orders ED Discharge Orders     None         Osie Cheeks 08/04/21 1948    Elson Areas, PA-C 08/04/21 1949    Wynetta Fines, MD 08/05/21 1144

## 2021-08-04 NOTE — ED Notes (Signed)
MD Chotiner returned this RNs page - stat troponin ordered and to be drawn

## 2021-08-04 NOTE — H&P (Signed)
History and Physical    Tanya Fisher FAO:[id number] DOB: 1995-10-12 DOA: 08/04/2021  PCP: Patient, No Pcp Per (Inactive)   Patient coming from: Home  I have personally briefly reviewed patient's old medical records in [facility]  Chief Complaint: chest pain  HPI: Tanya Fisher is a 26 y.o. female with medical history significant of DVT during pregnancy in 2017 and 2019 treated with Lovenox and subsequently discontinued after pregnancy presents with worsening chest pain for the last few days.  Patient complains of exertional chest pain, intermittent, sharp in nature, 5-7 out of 10 in intensity with no relieving or aggravating factors associated with exertional dyspnea.  She denies fever, nausea, vomiting, cough, trauma to chest, abdominal pain, diarrhea, dysuria, recent travels, sick contact, surgery.  Denies loss of consciousness or seizures.  ED Course: Troponins were 11 and 19.  EKG was unremarkable.  Chest x-ray showed no acute cardiopulmonary disease.  CTA of the chest showed acute right lower lobe segmental pulmonary embolism.  She was started on heparin drip. Hospitalist service was called to evaluate the patient.  Review of Systems: As per HPI otherwise all other systems were reviewed and are negative.   Past Medical History:  Diagnosis Date   Asthma    DVT (deep vein thrombosis) in pregnancy    Gestational diabetes    Herpes     Past Surgical History:  Procedure Laterality Date   CESAREAN SECTION     CESAREAN SECTION N/A 04/24/2019   Procedure: CESAREAN SECTION;  Surgeon: Levie Heritage, DO;  Location: [facility] LD [facility];  Service: Obstetrics;  Laterality: N/A;   DILATION AND EVACUATION     Therapeutic abortion 14 wks   Social history  reports that she quit smoking about 6 years ago. Her smoking use included cigarettes. She has never used smokeless tobacco. She reports that she does not drink alcohol and does not use drugs.  No Known Allergies  Family History   Problem Relation Age of Onset   Hypertension Mother    Diabetes Father    Hypertension Father     Prior to Admission medications   Medication Sig Start Date End Date Taking? Authorizing Provider  valACYclovir (VALTREX) 1000 MG tablet Take 1 tablet (1,000 mg total) by mouth daily. 04/16/21 04/16/22  Bernerd Limbo, CNM    Physical Exam: Vitals:   08/04/21 1327 08/04/21 1454 08/04/21 1626 08/04/21 1630  BP: (!) 147/89 139/84  (!) 131/102  Pulse: 93 68  68  Resp: 16 18  16   Temp: 98.2 F (36.8 C) 98.2 F (36.8 C)    TempSrc: Oral Oral    SpO2: 97% 100%  100%  Weight:   87.7 kg   Height:   5\' 3"  (1.6 m)     Constitutional: NAD, calm, comfortable.  Currently on room air. Vitals:   08/04/21 1327 08/04/21 1454 08/04/21 1626 08/04/21 1630  BP: (!) 147/89 139/84  (!) 131/102  Pulse: 93 68  68  Resp: 16 18  16   Temp: 98.2 F (36.8 C) 98.2 F (36.8 C)    TempSrc: Oral Oral    SpO2: 97% 100%  100%  Weight:   87.7 kg   Height:   5\' 3"  (1.6 m)    Eyes: PERRL, lids and conjunctivae normal ENMT: Mucous membranes are moist. Posterior pharynx clear of any exudate or lesions. Neck: normal, supple, no masses, no thyromegaly Respiratory: bilateral decreased breath sounds at bases, no wheezing, no crackles. Normal respiratory effort. No accessory  muscle use.  Cardiovascular: S1 S2 positive, rate controlled. No extremity edema. 2+ pedal pulses.  Abdomen: no tenderness, no masses palpated. No hepatosplenomegaly. Bowel sounds positive.  Musculoskeletal: no clubbing / cyanosis. No joint deformity upper and lower extremities.  Skin: no rashes, lesions, ulcers. No induration Neurologic: CN 2-12 grossly intact. Moving extremities. No focal neurologic deficits.  Psychiatric: Normal judgment and insight. Alert and oriented x 3. Normal mood.    Labs on Admission: I have personally reviewed following labs and imaging studies  CBC: Recent Labs  Lab 08/04/21 1338  WBC 6.4  HGB 11.6*   HCT 37.0  MCV 83.5  PLT 256   Basic Metabolic Panel: Recent Labs  Lab 08/04/21 1338  NA 137  K 4.0  CL 104  CO2 25  GLUCOSE 125*  BUN 6  CREATININE 1.00  CALCIUM 9.4   GFR: Estimated Creatinine Clearance: 89.5 mL/min (by C-G formula based on SCr of 1 mg/dL). Liver Function Tests: No results for input(s): AST, ALT, ALKPHOS, BILITOT, PROT, ALBUMIN in the last 168 hours. No results for input(s): LIPASE, AMYLASE in the last 168 hours. No results for input(s): AMMONIA in the last 168 hours. Coagulation Profile: No results for input(s): INR, PROTIME in the last 168 hours. Cardiac Enzymes: No results for input(s): CKTOTAL, CKMB, CKMBINDEX, TROPONINI in the last 168 hours. BNP (last 3 results) No results for input(s): PROBNP in the last 8760 hours. HbA1C: No results for input(s): HGBA1C in the last 72 hours. CBG: No results for input(s): GLUCAP in the last 168 hours. Lipid Profile: No results for input(s): CHOL, HDL, LDLCALC, TRIG, CHOLHDL, LDLDIRECT in the last 72 hours. Thyroid Function Tests: No results for input(s): TSH, T4TOTAL, FREET4, T3FREE, THYROIDAB in the last 72 hours. Anemia Panel: No results for input(s): VITAMINB12, FOLATE, FERRITIN, TIBC, IRON, RETICCTPCT in the last 72 hours. Urine analysis:    Component Value Date/Time   COLORURINE STRAW (A) 12/24/2018 1915   APPEARANCEUR CLEAR 12/24/2018 1915   LABSPEC 1.020 04/09/2019 1208   PHURINE 8.5 (H) 04/09/2019 1208   GLUCOSEU NEGATIVE 04/09/2019 1208   HGBUR NEGATIVE 04/09/2019 1208   BILIRUBINUR NEGATIVE 04/09/2019 1208   BILIRUBINUR negative 04/11/2017 1800   KETONESUR NEGATIVE 04/09/2019 1208   PROTEINUR NEGATIVE 04/09/2019 1208   UROBILINOGEN 0.2 04/09/2019 1208   NITRITE NEGATIVE 04/09/2019 1208   LEUKOCYTESUR NEGATIVE 04/09/2019 1208    Radiological Exams on Admission: DG Chest 2 View  Result Date: 08/04/2021 CLINICAL DATA:  Chest pain EXAM: CHEST - 2 VIEW COMPARISON:  None. FINDINGS: The heart  size and mediastinal contours are within normal limits. Both lungs are clear. The visualized skeletal structures are unremarkable. IMPRESSION: No evidence of acute cardiopulmonary disease. Electronically Signed   By: Caprice Renshaw M.D.   On: 08/04/2021 14:42   CT Angio Chest PE W and/or Wo Contrast  Result Date: 08/04/2021 CLINICAL DATA:  Chest pain or SOB, pleurisy or effusion suspected; chest pain; dvt history EXAM: CT ANGIOGRAPHY CHEST WITH CONTRAST TECHNIQUE: Multidetector CT imaging of the chest was performed using the standard protocol during bolus administration of intravenous contrast. Multiplanar CT image reconstructions and MIPs were obtained to evaluate the vascular anatomy. RADIATION DOSE REDUCTION: This exam was performed according to the departmental dose-optimization program which includes automated exposure control, adjustment of the mA and/or kV according to patient size and/or use of iterative reconstruction technique. CONTRAST:  72mL OMNIPAQUE IOHEXOL 350 MG/ML SOLN COMPARISON:  None. FINDINGS: Cardiovascular: Satisfactory opacification of the pulmonary arteries to the segmental level.  Acute segmental pulmonary emboli are present in the right lower lobe. There is also some involvement of the lingula. Normal heart size. No pericardial effusion. Mediastinum/Nodes: No enlarged nodes. Included thyroid is unremarkable. Esophagus is unremarkable. Lungs/Pleura: No consolidation or mass. No pleural effusion or pneumothorax. Upper Abdomen: No acute abnormality. Musculoskeletal: No acute osseous abnormality. Review of the MIP images confirms the above findings. IMPRESSION: Acute right lower lobe segmental pulmonary emboli. Some segmental involvement of the lingula as well. These results were called by telephone at the time of interpretation on 08/04/2021 at 3:55 pm. Electronically Signed   By: Guadlupe SpanishPraneil  Patel M.D.   On: 08/04/2021 16:00    EKG: Independently reviewed.  Normal sinus rhythm with no ST  elevations or depressions.  Assessment/Plan  Acute right lower lobe segmental pulmonary embolism -Patient has a history of lower extremity DVT during pregnancy in 2017 and is a 18 treated with Lovenox and discontinued after pregnancy -Presented with worsening chest pain and exertional dyspnea for the last few days with no significant elevation of troponin and EKG unremarkable.  CTA chest as above. -Currently on heparin drip.  Continue heparin drip.  2D echo.  Lower extremity duplex ultrasound.  Possibly switch to oral anticoagulation tomorrow (Eliquis versus Xarelto).  If patient remains stable tomorrow, she can possibly be discharged. -Might need outpatient hematology evaluation.  DVT prophylaxis: Heparin drip Code Status: Full Family Communication: None at bedside Disposition Plan: Home in 1 to 2 days once clinically improved Consults called: None Admission status: Telemetry/observation  Severity of Illness: The appropriate patient status for this patient is OBSERVATION. Observation status is judged to be reasonable and necessary in order to provide the required intensity of service to ensure the patient's safety. The patient's presenting symptoms, physical exam findings, and initial radiographic and laboratory data in the context of their medical condition is felt to place them at decreased risk for further clinical deterioration. Furthermore, it is anticipated that the patient will be medically stable for discharge from the hospital within 2 midnights of admission.     Glade LloydKshitiz Jahkai Yandell MD [facility]  08/04/2021, 5:27 PM

## 2021-08-04 NOTE — ED Notes (Signed)
Pt having ST elevation and depression on the monitor - EKG repeated - MD Chotiner paged and made aware - pt is having no CP currently - heparin gtt running

## 2021-08-04 NOTE — ED Triage Notes (Signed)
Patient complains of chest pressure that started three days ago and is worse when she lays down. Patient alert, oriented, speaking in complete sentences and in no apparent distress at this time.

## 2021-08-04 NOTE — Progress Notes (Signed)
ANTICOAGULATION CONSULT NOTE - Follow Up Consult  Pharmacy Consult for Heparin IV Indication: pulmonary embolus  No Known Allergies  Patient Measurements: Height: 5\' 3"  (160 cm) Weight: 87.7 kg (193 lb 5.5 oz) IBW/kg (Calculated) : 52.4 Heparin Dosing Weight: 72.2 kg  Vital Signs: Temp: 98.2 F (36.8 C) (01/31 1454) Temp Source: Oral (01/31 1454) BP: 131/102 (01/31 1630) Pulse Rate: 68 (01/31 1630)  Labs: Recent Labs    08/04/21 1338 08/04/21 1520  HGB 11.6*  --   HCT 37.0  --   PLT 256  --   CREATININE 1.00  --   TROPONINIHS 11 19*    Estimated Creatinine Clearance: 89.5 mL/min (by C-G formula based on SCr of 1 mg/dL).   Medications:  Infusions:   heparin      Assessment: 26 y.o. female with medical history significant of DVT during pregnancy in 2019 treated with anticoagulation and subsequently discontinued presents with worsening chest pain for the last few days. No prior anticoagulation   H/H 11.6/37, plt 256  Goal of Therapy:  Heparin level 0.3-0.7 units/ml Monitor platelets by anticoagulation protocol: Yes   Plan:  Heparin 4500 units bolus x1 followed by heparin drip at 1150 units/hr Heparin level at 6 hours Daily heparin level and CBC ordered Monitor for signs/symptoms of bleed  Thank you for allowing pharmacy to be a part of this patients care.  04-06-1999, PharmD Clinical Pharmacist  Please check AMION for all [facility] Pharmacy numbers After 10:00 PM, call Main Pharmacy [phone]

## 2021-08-05 ENCOUNTER — Other Ambulatory Visit (HOSPITAL_COMMUNITY): Payer: Self-pay

## 2021-08-05 ENCOUNTER — Observation Stay (HOSPITAL_BASED_OUTPATIENT_CLINIC_OR_DEPARTMENT_OTHER): Payer: Medicaid Other

## 2021-08-05 ENCOUNTER — Encounter (HOSPITAL_COMMUNITY): Payer: Self-pay | Admitting: Internal Medicine

## 2021-08-05 DIAGNOSIS — I2699 Other pulmonary embolism without acute cor pulmonale: Secondary | ICD-10-CM | POA: Diagnosis not present

## 2021-08-05 DIAGNOSIS — M7989 Other specified soft tissue disorders: Secondary | ICD-10-CM

## 2021-08-05 DIAGNOSIS — R609 Edema, unspecified: Secondary | ICD-10-CM

## 2021-08-05 DIAGNOSIS — R0609 Other forms of dyspnea: Secondary | ICD-10-CM

## 2021-08-05 LAB — COMPREHENSIVE METABOLIC PANEL
ALT: 17 U/L (ref 0–44)
AST: 17 U/L (ref 15–41)
Albumin: 3.3 g/dL — ABNORMAL LOW (ref 3.5–5.0)
Alkaline Phosphatase: 72 U/L (ref 38–126)
Anion gap: 9 (ref 5–15)
BUN: 6 mg/dL (ref 6–20)
CO2: 23 mmol/L (ref 22–32)
Calcium: 8.8 mg/dL — ABNORMAL LOW (ref 8.9–10.3)
Chloride: 106 mmol/L (ref 98–111)
Creatinine, Ser: 0.82 mg/dL (ref 0.44–1.00)
GFR, Estimated: >60 mL/min (ref >60)
Glucose, Bld: 105 mg/dL — ABNORMAL HIGH (ref 70–99)
Potassium: 3.9 mmol/L (ref 3.5–5.1)
Sodium: 138 mmol/L (ref 135–145)
Total Bilirubin: 0.6 mg/dL (ref 0.3–1.2)
Total Protein: 6.8 g/dL (ref 6.5–8.1)

## 2021-08-05 LAB — HEPARIN LEVEL (UNFRACTIONATED)
Heparin Unfractionated: 0.66 IU/mL (ref 0.30–0.70)
Heparin Unfractionated: 0.81 IU/mL — ABNORMAL HIGH (ref 0.30–0.70)

## 2021-08-05 LAB — ECHOCARDIOGRAM COMPLETE
AR max vel: 2.37 cm2
AV Area VTI: 2.36 cm2
AV Area mean vel: 2.26 cm2
AV Mean grad: 3 mmHg
AV Peak grad: 4.7 mmHg
Ao pk vel: 1.08 m/s
Area-P 1/2: 4.17 cm2
Height: 63 in
S' Lateral: 3 cm
Weight: 3093.49 oz

## 2021-08-05 LAB — CBC
HCT: 34.6 % — ABNORMAL LOW (ref 36.0–46.0)
Hemoglobin: 10.4 g/dL — ABNORMAL LOW (ref 12.0–15.0)
MCH: 25.6 pg — ABNORMAL LOW (ref 26.0–34.0)
MCHC: 30.1 g/dL (ref 30.0–36.0)
MCV: 85.2 fL (ref 80.0–100.0)
Platelets: 235 10*3/uL (ref 150–400)
RBC: 4.06 MIL/uL (ref 3.87–5.11)
RDW: 18.1 % — ABNORMAL HIGH (ref 11.5–15.5)
WBC: 5.5 10*3/uL (ref 4.0–10.5)
nRBC: 0 % (ref 0.0–0.2)

## 2021-08-05 LAB — MAGNESIUM: Magnesium: 1.9 mg/dL (ref 1.7–2.4)

## 2021-08-05 MED ORDER — APIXABAN 5 MG PO TABS
10.0000 mg | ORAL_TABLET | Freq: Once | ORAL | Status: AC
Start: 2021-08-05 — End: 2021-08-05
  Administered 2021-08-05: 10 mg via ORAL
  Filled 2021-08-05: qty 2

## 2021-08-05 MED ORDER — APIXABAN (ELIQUIS) VTE STARTER PACK (10MG AND 5MG)
ORAL_TABLET | ORAL | 0 refills | Status: DC
  Filled 2021-08-05: qty 74, 30d supply, fill #0

## 2021-08-05 NOTE — Discharge Instructions (Addendum)
Information on my medicine - ELIQUIS (apixaban)  This medication education was reviewed with me or my healthcare representative as part of my discharge preparation.  Why was Eliquis prescribed for you? Eliquis was prescribed to treat blood clots that may have been found in the veins of your legs (deep vein thrombosis) or in your lungs (pulmonary embolism) and to reduce the risk of them occurring again.  What do You need to know about Eliquis ? The starting dose is 10 mg (two 5 mg tablets) taken TWICE daily for the FIRST SEVEN (7) DAYS, then on (enter date)  08/12/21  the dose is reduced to ONE 5 mg tablet taken TWICE daily.  Eliquis may be taken with or without food.   Try to take the dose about the same time in the morning and in the evening. If you have difficulty swallowing the tablet whole please discuss with your pharmacist how to take the medication safely.  Take Eliquis exactly as prescribed and DO NOT stop taking Eliquis without talking to the doctor who prescribed the medication.  Stopping may increase your risk of developing a new blood clot.  Refill your prescription before you run out.  After discharge, you should have regular check-up appointments with your healthcare provider that is prescribing your Eliquis.    What do you do if you miss a dose? If a dose of ELIQUIS is not taken at the scheduled time, take it as soon as possible on the same day and twice-daily administration should be resumed. The dose should not be doubled to make up for a missed dose.  Important Safety Information A possible side effect of Eliquis is bleeding. You should call your healthcare provider right away if you experience any of the following: Bleeding from an injury or your nose that does not stop. Unusual colored urine (red or dark brown) or unusual colored stools (red or black). Unusual bruising for unknown reasons. A serious fall or if you hit your head (even if there is no  bleeding).  Some medicines may interact with Eliquis and might increase your risk of bleeding or clotting while on Eliquis. To help avoid this, consult your healthcare provider or pharmacist prior to using any new prescription or non-prescription medications, including herbals, vitamins, non-steroidal anti-inflammatory drugs (NSAIDs) and supplements.  This website has more information on Eliquis (apixaban): http://www.eliquis.com/eliquis/home

## 2021-08-05 NOTE — Progress Notes (Signed)
Echocardiogram 2D Echocardiogram has been performed.  Tanya Fisher 08/05/2021, 8:38 AM

## 2021-08-05 NOTE — Discharge Summary (Signed)
Physician Discharge Summary   Patient: Tanya Fisher MRN: [id number] DOB: 1995/11/01  Admit date:     08/04/2021  Discharge date: 08/05/21  Discharge Physician: Patrecia Pour   PCP: Patient, No Pcp Per (Inactive)   Recommendations at discharge:   Follow up with hematology, Dr. Irene Limbo for recurrent, this time unprovoked, PE. Started on eliquis.  Follow up with PCP, recommend CBC in 1-2 weeks.   Discharge Diagnoses: Principal Problem:   Pulmonary embolism [facility])  Hospital Course: Tanya Fisher is a 26 y.o. female with medical history significant of DVT during pregnancy treated with Lovenox and subsequently discontinued after pregnancy presented 1/31 with worsening chest pain for the last few days. Troponins were 11 and 19.  EKG was unremarkable.  Chest x-ray showed no acute cardiopulmonary disease.  CTA of the chest showed acute right lower lobe segmental pulmonary embolism. Venous U/S revealed no LE DVT's. She was started on heparin drip, transition to eliquis after discussion regarding anticoagulation options. Menstrual bleeding has stabilized and she is clear for discharge.   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  - Convert to DOAC, CM consulted. Pt prefers slightly lower GI bleeding risk of eliquis to alternatives, able to take pill BID reliably. Pregnancy caution advised.  - Discussed with hematology, Dr. Lorenso Courier, who recommends no further work up at this time. Pt will follow up with Dr. Irene Limbo who saw her previously.    Acute blood loss anemia: Likely related to menses.  - Monitor clinically, recommend CBC at follow up.    Obesity: Body mass index is 34.25 kg/m.   Consultants: Hematology curbside Procedures performed: None  Disposition: Home Diet recommendation:  Regular diet  DISCHARGE MEDICATION: Allergies as of 08/05/2021   No Known Allergies      Medication List      TAKE these medications    Apixaban Starter Pack (10mg  and 5mg ) Commonly known as: ELIQUIS STARTER PACK Take as directed on package: start with two-5mg  tablets twice daily for 7 days. On day 8, switch to one-5mg  tablet twice daily.   valACYclovir 1000 MG tablet Commonly known as: Valtrex Take 1 tablet (1,000 mg total) by mouth daily.        Follow-up Information     Brunetta Genera, MD. Schedule an appointment as soon as possible for a visit.   Specialties: Hematology, Oncology Contact information: [place] [address] [phone]                Subjective: Feels well  Discharge Exam: Filed Weights   08/04/21 1626  Weight: 87.7 kg   No distress, clear nonlabored, RRR without edema, normal skin coloration, no active bleeding  Condition at discharge: good  The results of significant diagnostics from this hospitalization (including imaging, microbiology, ancillary and laboratory) are listed below for reference.   Imaging Studies: DG Chest 2 View  Result Date: 08/04/2021 CLINICAL DATA:  Chest pain EXAM: CHEST - 2 VIEW COMPARISON:  None. FINDINGS: The heart size and mediastinal contours are within normal limits. Both lungs are clear. The visualized skeletal structures are unremarkable. IMPRESSION: No evidence of acute cardiopulmonary disease. Electronically Signed   By: Maurine Simmering M.D.   On: 08/04/2021 14:42   CT Angio Chest PE W and/or Wo Contrast  Result Date: 08/04/2021 CLINICAL DATA:  Chest pain or SOB, pleurisy or effusion suspected; chest pain; dvt history EXAM: CT ANGIOGRAPHY CHEST WITH  CONTRAST TECHNIQUE: Multidetector CT imaging of the chest was performed using the standard protocol during bolus administration of intravenous contrast. Multiplanar CT image reconstructions and MIPs were obtained to evaluate the vascular anatomy. RADIATION DOSE REDUCTION: This exam was performed according to the departmental dose-optimization  program which includes automated exposure control, adjustment of the mA and/or kV according to patient size and/or use of iterative reconstruction technique. CONTRAST:  35mL OMNIPAQUE IOHEXOL 350 MG/ML SOLN COMPARISON:  None. FINDINGS: Cardiovascular: Satisfactory opacification of the pulmonary arteries to the segmental level. Acute segmental pulmonary emboli are present in the right lower lobe. There is also some involvement of the lingula. Normal heart size. No pericardial effusion. Mediastinum/Nodes: No enlarged nodes. Included thyroid is unremarkable. Esophagus is unremarkable. Lungs/Pleura: No consolidation or mass. No pleural effusion or pneumothorax. Upper Abdomen: No acute abnormality. Musculoskeletal: No acute osseous abnormality. Review of the MIP images confirms the above findings. IMPRESSION: Acute right lower lobe segmental pulmonary emboli. Some segmental involvement of the lingula as well. These results were called by telephone at the time of interpretation on 08/04/2021 at 3:55 pm. Electronically Signed   By: Macy Mis M.D.   On: 08/04/2021 16:00   ECHOCARDIOGRAM COMPLETE  Result Date: 08/05/2021    ECHOCARDIOGRAM REPORT   Patient Name:   Tanya Fisher Date of Exam: 08/05/2021 Medical Rec #:  [id number]        Height:       63.0 in Accession #:    [id number]       Weight:       193.3 lb Date of Birth:  07/08/1995         BSA:          1.906 m Patient Age:    25 years         BP:           125/101 mmHg Patient Gender: F                HR:           63 bpm. Exam Location:  Inpatient Procedure: 2D Echo Indications:    Dyspnea  History:        Patient has no prior history of Echocardiogram examinations.                 DVT.  Sonographer:    Arlyss Gandy Referring Phys: [id number] [place]  1. Left ventricular ejection fraction, by estimation, is 60 to 65%. The left ventricle has normal function. The left ventricle has no regional wall motion abnormalities. Left ventricular diastolic  parameters were normal.  2. Right ventricular systolic function is normal. The right ventricular size is normal. There is normal pulmonary artery systolic pressure.  3. The mitral valve is normal in structure. Trivial mitral valve regurgitation. No evidence of mitral stenosis.  4. The aortic valve is normal in structure. Aortic valve regurgitation is not visualized. No aortic stenosis is present.  5. The inferior vena cava is normal in size with greater than 50% respiratory variability, suggesting right atrial pressure of 3 mmHg. Comparison(s): No prior Echocardiogram. Conclusion(s)/Recommendation(s): Normal biventricular function without evidence of hemodynamically significant valvular heart disease. FINDINGS  Left Ventricle: Left ventricular ejection fraction, by estimation, is 60 to 65%. The left ventricle has normal function. The left ventricle has no regional wall motion abnormalities. The left ventricular internal cavity size was normal in size. There is  no left ventricular hypertrophy. Left ventricular diastolic parameters were normal. Right Ventricle: The  right ventricular size is normal. No increase in right ventricular wall thickness. Right ventricular systolic function is normal. There is normal pulmonary artery systolic pressure. The tricuspid regurgitant velocity is 1.96 m/s, and  with an assumed right atrial pressure of 3 mmHg, the estimated right ventricular systolic pressure is [id number] mmHg. Left Atrium: Left atrial size was normal in size. Right Atrium: Right atrial size was normal in size. Pericardium: There is no evidence of pericardial effusion. Mitral Valve: The mitral valve is normal in structure. Trivial mitral valve regurgitation. No evidence of mitral valve stenosis. Tricuspid Valve: The tricuspid valve is normal in structure. Tricuspid valve regurgitation is mild . No evidence of tricuspid stenosis. Aortic Valve: The aortic valve is normal in structure. Aortic valve regurgitation is not  visualized. No aortic stenosis is present. Aortic valve mean gradient measures 3.0 mmHg. Aortic valve peak gradient measures 4.7 mmHg. Aortic valve area, by VTI measures 2.36 cm. Pulmonic Valve: The pulmonic valve was normal in structure. Pulmonic valve regurgitation is mild. No evidence of pulmonic stenosis. Aorta: The aortic root is normal in size and structure. Venous: The inferior vena cava is normal in size with greater than 50% respiratory variability, suggesting right atrial pressure of 3 mmHg. IAS/Shunts: No atrial level shunt detected by color flow Doppler.  LEFT VENTRICLE PLAX 2D LVIDd:         4.20 cm   Diastology LVIDs:         3.00 cm   LV e' medial:    11.30 cm/s LV PW:         1.00 cm   LV E/e' medial:  7.7 LV IVS:        1.00 cm   LV e' lateral:   15.00 cm/s LVOT diam:     2.00 cm   LV E/e' lateral: 5.8 LV SV:         56 LV SV Index:   29 LVOT Area:     3.14 cm  RIGHT VENTRICLE            IVC RV Basal diam:  3.10 cm    IVC diam: 2.00 cm RV Mid diam:    2.80 cm RV S prime:     8.92 cm/s TAPSE (M-mode): 2.0 cm LEFT ATRIUM             Index        RIGHT ATRIUM           Index LA diam:        3.20 cm 1.68 cm/m   RA Area:     12.10 cm LA Vol (A2C):   34.5 ml 18.10 ml/m  RA Volume:   25.70 ml  13.48 ml/m LA Vol (A4C):   39.4 ml 20.67 ml/m LA Biplane Vol: 39.9 ml 20.93 ml/m  AORTIC VALVE AV Area (Vmax):    2.37 cm AV Area (Vmean):   2.26 cm AV Area (VTI):     2.36 cm AV Vmax:           108.00 cm/s AV Vmean:          73.300 cm/s AV VTI:            0.236 m AV Peak Grad:      4.7 mmHg AV Mean Grad:      3.0 mmHg LVOT Vmax:         81.40 cm/s LVOT Vmean:        52.700 cm/s LVOT VTI:  0.177 m LVOT/AV VTI ratio: 0.75  AORTA Ao Root diam: 2.70 cm Ao Asc diam:  2.70 cm MITRAL VALVE               TRICUSPID VALVE MV Area (PHT): 4.17 cm    TR Peak grad:   15.4 mmHg MV Decel Time: 182 msec    TR Vmax:        196.00 cm/s MV E velocity: 86.50 cm/s MV A velocity: 43.30 cm/s  SHUNTS MV E/A ratio:  2.00         Systemic VTI:  0.18 m                            Systemic Diam: 2.00 cm Kardie Tobb DO Electronically signed by Berniece Salines DO Signature Date/Time: 08/05/2021/10:37:17 AM    Final    VAS [place] LOWER EXTREMITY VENOUS (DVT)  Result Date: 08/05/2021  Lower Venous DVT Study Patient Name:  Tanya Fisher  Date of Exam:   08/05/2021 Medical Rec #: [id number]         Accession #:    [id number] Date of Birth: [birth year]          Patient Gender: F Patient Age:   43 years Exam Location[facility]  [facility] Procedure:      VAS [place] LOWER EXTREMITY VENOUS (DVT) Referring Phys: Aline August --------------------------------------------------------------------------------  Indications: Swelling, Edema, and pulmonary embolism.  Comparison Study: no prior Performing Technologist: Archie Patten RVS  Examination Guidelines: A complete evaluation includes B-mode imaging, spectral Doppler, color Doppler, and power Doppler as needed of all accessible portions of each vessel. Bilateral testing is considered an integral part of a complete examination. Limited examinations for reoccurring indications may be performed as noted. The reflux portion of the exam is performed with the patient in reverse Trendelenburg.  +---------+---------------+---------+-----------+----------+--------------+  RIGHT     Compressibility Phasicity Spontaneity Properties Thrombus Aging  +---------+---------------+---------+-----------+----------+--------------+  CFV       Full            Yes       Yes                                    +---------+---------------+---------+-----------+----------+--------------+  SFJ       Full                                                             +---------+---------------+---------+-----------+----------+--------------+  FV Prox   Full                                                             +---------+---------------+---------+-----------+----------+--------------+  FV Mid    Full                                                              +---------+---------------+---------+-----------+----------+--------------+  FV Distal Full                                                             +---------+---------------+---------+-----------+----------+--------------+  PFV       Full                                                             +---------+---------------+---------+-----------+----------+--------------+  POP       Full            Yes       Yes                                    +---------+---------------+---------+-----------+----------+--------------+  PTV       Full                                                             +---------+---------------+---------+-----------+----------+--------------+  PERO      Full                                                             +---------+---------------+---------+-----------+----------+--------------+   +---------+---------------+---------+-----------+----------+--------------+  LEFT      Compressibility Phasicity Spontaneity Properties Thrombus Aging  +---------+---------------+---------+-----------+----------+--------------+  CFV       Full            Yes       Yes                                    +---------+---------------+---------+-----------+----------+--------------+  SFJ       Full                                                             +---------+---------------+---------+-----------+----------+--------------+  FV Prox   Full                                                             +---------+---------------+---------+-----------+----------+--------------+  FV Mid    Full                                                             +---------+---------------+---------+-----------+----------+--------------+  FV Distal Full                                                             +---------+---------------+---------+-----------+----------+--------------+  PFV       Full                                                              +---------+---------------+---------+-----------+----------+--------------+  POP       Full            Yes       Yes                                    +---------+---------------+---------+-----------+----------+--------------+  PTV       Full                                                             +---------+---------------+---------+-----------+----------+--------------+  PERO      Full                                                             +---------+---------------+---------+-----------+----------+--------------+     Summary: BILATERAL: - No evidence of deep vein thrombosis seen in the lower extremities, bilaterally. -No evidence of popliteal cyst, bilaterally.   *See table(s) above for measurements and observations.    Preliminary     Microbiology: Results for orders placed or performed during the hospital encounter of 08/04/21  Resp Panel by RT-PCR (Flu A&B, Covid) Nasopharyngeal Swab     Status: None   Collection Time: 08/04/21  5:22 PM   Specimen: Nasopharyngeal Swab; Nasopharyngeal(NP) swabs in vial transport medium  Result Value Ref Range Status   SARS Coronavirus 2 by RT PCR NEGATIVE NEGATIVE Final    Comment: (NOTE) SARS-CoV-2 target nucleic acids are NOT DETECTED.  The SARS-CoV-2 RNA is generally detectable in upper respiratory specimens during the acute phase of infection. The lowest concentration of SARS-CoV-2 viral copies this assay can detect is 138 copies/mL. A negative result does not preclude SARS-Cov-2 infection and should not be used as the sole basis for treatment or other patient management decisions. A negative result may occur with  improper specimen collection/handling, submission of specimen other than nasopharyngeal swab, presence of viral mutation(s) within the areas targeted by this assay, and inadequate number of viral copies(<138 copies/mL). A negative result must be combined with clinical observations, patient history, and epidemiological information.  The expected result is Negative.  Fact Sheet for Patients:  EntrepreneurPulse.com.au  Fact Sheet for Healthcare Providers:  IncredibleEmployment.be  This test is no t yet approved or cleared by the [place] FDA and  has  been authorized for detection and/or diagnosis of SARS-CoV-2 by FDA under an Emergency Use Authorization (EUA). This EUA will remain  in effect (meaning this test can be used) for the duration of the COVID-19 declaration under Section 564(b)(1) of the Act, 21 U.S.C.section 360bbb-3(b)(1), unless the authorization is terminated  or revoked sooner.       Influenza A by PCR NEGATIVE NEGATIVE Final   Influenza B by PCR NEGATIVE NEGATIVE Final    Comment: (NOTE) The Xpert Xpress SARS-CoV-2/FLU/RSV plus assay is intended as an aid in the diagnosis of influenza from Nasopharyngeal swab specimens and should not be used as a sole basis for treatment. Nasal washings and aspirates are unacceptable for Xpert Xpress SARS-CoV-2/FLU/RSV testing.  Fact Sheet for Patients: EntrepreneurPulse.com.au  Fact Sheet for Healthcare Providers: IncredibleEmployment.be  This test is not yet approved or cleared by the [place] FDA and has been authorized for detection and/or diagnosis of SARS-CoV-2 by FDA under an Emergency Use Authorization (EUA). This EUA will remain in effect (meaning this test can be used) for the duration of the COVID-19 declaration under Section 564(b)(1) of the Act, 21 U.S.C. section 360bbb-3(b)(1), unless the authorization is terminated or revoked.  Performed at [facility], [place] [address] [place], [place] [address]     Labs: CBC: Recent Labs  Lab 08/04/21 1338 08/05/21 0815  WBC 6.4 5.5  HGB 11.6* 10.4*  HCT 37.0 34.6*  MCV 83.5 85.2  PLT 256 [id number]   Basic Metabolic Panel: Recent Labs  Lab 08/04/21 1338 08/05/21 0815  NA 137 138  K 4.0 3.9  CL 104 106   CO2 25 23  GLUCOSE 125* 105*  BUN 6 6  CREATININE 1.00 0.82  CALCIUM 9.4 8.8*  MG  --  1.9   Liver Function Tests: Recent Labs  Lab 08/05/21 0815  AST 17  ALT 17  ALKPHOS 72  BILITOT 0.6  PROT 6.8  ALBUMIN 3.3*   CBG: No results for input(s): GLUCAP in the last 168 hours.  Discharge time spent: greater than 30 minutes.  Signed: Patrecia Pour, MD [facility] 08/05/2021

## 2021-08-05 NOTE — Progress Notes (Signed)
ANTICOAGULATION CONSULT NOTE  Pharmacy Consult for Heparin  Indication: pulmonary embolus Brief A/P: Heparin level supratherapeutic Decrease Heparin rate  No Known Allergies  Patient Measurements: Height: 5\' 3"  (160 cm) Weight: 87.7 kg (193 lb 5.5 oz) IBW/kg (Calculated) : 52.4  Vital Signs: Temp: 98.2 F (36.8 C) (01/31 1454) Temp Source: Oral (01/31 1454) BP: 143/93 (02/01 0015) Pulse Rate: 60 (02/01 0015)  Labs: Recent Labs    08/04/21 1338 08/04/21 1520 08/04/21 1950 08/04/21 2141 08/04/21 2359  HGB 11.6*  --   --   --   --   HCT 37.0  --   --   --   --   PLT 256  --   --   --   --   HEPARINUNFRC  --   --   --   --  0.81*  CREATININE 1.00  --   --   --   --   TROPONINIHS 11 19* 25* 20*  --      Estimated Creatinine Clearance: 89.5 mL/min (by C-G formula based on SCr of 1 mg/dL).   Assessment: 26 y.o. female with PE for heparin  Goal of Therapy:  Heparin level 0.3-0.7 units/ml Monitor platelets by anticoagulation protocol: Yes   Plan:  Decrease Heparin 1050 units/hr Follow-up am labs.  30, PharmD, BCPS

## 2021-08-05 NOTE — Progress Notes (Signed)
ANTICOAGULATION CONSULT NOTE - Follow Up Consult  Pharmacy Consult for Heparin IV Indication: pulmonary embolus  No Known Allergies  Patient Measurements: Height: 5\' 3"  (160 cm) Weight: 87.7 kg (193 lb 5.5 oz) IBW/kg (Calculated) : 52.4 Heparin Dosing Weight: 72.2 kg  Vital Signs: Temp: 98.2 F (36.8 C) (02/01 0730) Temp Source: Oral (02/01 0730) BP: 125/101 (02/01 0730) Pulse Rate: 63 (02/01 0730)  Labs: Recent Labs    08/04/21 1338 08/04/21 1520 08/04/21 1950 08/04/21 2141 08/04/21 2359 08/05/21 0815  HGB 11.6*  --   --   --   --  10.4*  HCT 37.0  --   --   --   --  34.6*  PLT 256  --   --   --   --  235  HEPARINUNFRC  --   --   --   --  0.81* 0.66  CREATININE 1.00  --   --   --   --   --   TROPONINIHS 11 19* 25* 20*  --   --      Estimated Creatinine Clearance: 89.5 mL/min (by C-G formula based on SCr of 1 mg/dL).   Medications:  Infusions:   heparin 1,050 Units/hr (08/05/21 0046)    Assessment: 26 y.o. female with medical history significant of DVT during pregnancy in 2019 treated with anticoagulation and subsequently discontinued presents with worsening chest pain for the last few days. Pharmacy consulted to dose heparin for PE. No current PTA anticoagulation.  Heparin level now therapeutic after rate decrease overnight but near top of range at 0.66. Hg down to 10.4, plt wnl. No bleeding or issues with infusion per discussion with RN.  Goal of Therapy:  Heparin level 0.3-0.7 units/ml Monitor platelets by anticoagulation protocol: Yes   Plan:  Decrease heparin IV slightly to 1000 units/hr to ensure stays in range Check confirmatory heparin level in 6 hours Daily heparin level and CBC ordered Monitor for signs/symptoms of bleed   2020, PharmD, BCPS Please check AMION for all [facility] contact numbers Clinical Pharmacist 08/05/2021 9:29 AM

## 2021-08-05 NOTE — Progress Notes (Signed)
ANTICOAGULATION CONSULT NOTE - Follow Up Consult  Pharmacy Consult for Heparin IV >> apixaban Indication: pulmonary embolus  No Known Allergies  Patient Measurements: Height: 5\' 3"  (160 cm) Weight: 87.7 kg (193 lb 5.5 oz) IBW/kg (Calculated) : 52.4 Heparin Dosing Weight: 72.2 kg  Vital Signs: Temp: 98.2 F (36.8 C) (02/01 1030) Temp Source: Oral (02/01 1030) BP: 135/104 (02/01 1030) Pulse Rate: 65 (02/01 1030)  Labs: Recent Labs    08/04/21 1338 08/04/21 1520 08/04/21 1950 08/04/21 2141 08/04/21 2359 08/05/21 0815  HGB 11.6*  --   --   --   --  10.4*  HCT 37.0  --   --   --   --  34.6*  PLT 256  --   --   --   --  235  HEPARINUNFRC  --   --   --   --  0.81* 0.66  CREATININE 1.00  --   --   --   --  0.82  TROPONINIHS 11 19* 25* 20*  --   --      Estimated Creatinine Clearance: 109.1 mL/min (by C-G formula based on SCr of 0.82 mg/dL).   Medications:  Infusions:   heparin 1,000 Units/hr (08/05/21 1026)    Assessment: 26 y.o. female with medical history significant of DVT during pregnancy in 2019 treated with anticoagulation and subsequently discontinued presents with worsening chest pain for the last few days. Pharmacy consulted to dose heparin for PE. No current PTA anticoagulation.  Heparin level now therapeutic after rate decrease overnight but near top of range at 0.66. Hg down to 10.4, plt wnl. No bleeding or issues with infusion per discussion with RN.  Goal of Therapy:  Heparin level 0.3-0.7 units/ml Monitor platelets by anticoagulation protocol: Yes   Plan:  Decrease heparin IV slightly to 1000 units/hr to ensure stays in range Check confirmatory heparin level in 6 hours Daily heparin level and CBC ordered Monitor for signs/symptoms of bleed   2020, PharmD, BCPS Please check AMION for all [facility] contact numbers Clinical Pharmacist 08/05/2021 11:49 AM  ADDENDUM: Pharmacy consulted to transition to apixaban for acute recurrent  PE.  Plan: Stop heparin drip at time of 1st dose of apixaban - discussed plan with RN Apixaban 10mg  PO BID x 7 days; then 5mg  PO BID (discharge orders already entered so will only order 1st dose in the ER) Monitor CBC, s/sx bleeding   10/03/2021, PharmD, BCPS Please check AMION for all [facility] contact numbers Clinical Pharmacist 08/05/2021 11:51 AM

## 2021-08-05 NOTE — Progress Notes (Signed)
Lower extremity venous has been completed.   Preliminary results in CV Proc.   Tanya Fisher [name] 08/05/2021 9:46 AM

## 2021-08-06 DIAGNOSIS — I2699 Other pulmonary embolism without acute cor pulmonale: Secondary | ICD-10-CM | POA: Diagnosis not present

## 2021-08-06 MED ORDER — APIXABAN 5 MG PO TABS
10.0000 mg | ORAL_TABLET | Freq: Two times a day (BID) | ORAL | Status: DC
  Administered 2021-08-06: 10 mg via ORAL
  Filled 2021-08-06: qty 2

## 2021-08-06 MED ORDER — APIXABAN 5 MG PO TABS: 5.0000 mg | ORAL_TABLET | Freq: Two times a day (BID) | ORAL | Status: DC

## 2021-08-06 NOTE — TOC Initial Note (Signed)
Transition of Care [facility]) - Initial/Assessment Note    Patient Details  Name: Tanya Fisher MRN: [id number] Date of Birth: 1996-06-26  Transition of Care [facility]) CM/SW Contact:    Lockie Pares, RN Phone Number: 08/06/2021, 8:53 AM  Clinical Narrative:                 The Transition of Care Department [facility]) has reviewed patient and no TOC needs have been identified at this time. We will continue to monitor patient advancement through interdisciplinary progression rounds. If new patient transition needs arise, please place a TOC consult         Patient Goals and CMS Choice        Expected Discharge Plan and Services           Expected Discharge Date: 08/06/21                                    Prior Living Arrangements/Services                       Activities of Daily Living Home Assistive Devices/Equipment: None ADL Screening (condition at time of admission) Patient's cognitive ability adequate to safely complete daily activities?: Yes Is the patient deaf or have difficulty hearing?: No Does the patient have difficulty seeing, even when wearing glasses/contacts?: No Does the patient have difficulty concentrating, remembering, or making decisions?: No Patient able to express need for assistance with ADLs?: Yes Does the patient have difficulty dressing or bathing?: No Independently performs ADLs?: Yes (appropriate for developmental age) Does the patient have difficulty walking or climbing stairs?: No Weakness of Legs: None Weakness of Arms/Hands: None  Permission Sought/Granted                  Emotional Assessment              Admission diagnosis:  Pulmonary embolism (HCC) [I26.99] Other acute pulmonary embolism, unspecified whether acute cor pulmonale present (HCC) [I26.99] Patient Active Problem List   Diagnosis Date Noted   Pulmonary embolism (HCC) 08/04/2021   Recurrent HSV (herpes simplex virus) 10/11/2019   Vaginal  itching 10/11/2019   Status post cesarean section 04/24/2019   History of cesarean delivery 04/09/2019   History of herpes simplex infection 02/28/2019   GDM (gestational diabetes mellitus) 02/28/2019   Trichimoniasis 01/29/2019   Substance abuse affecting pregnancy, antepartum 01/29/2019   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   Supervision of high risk pregnancy, antepartum 04/29/2017   DVT (deep vein thrombosis) in pregnancy 04/29/2017   History of VBAC 04/29/2017   PCP:  Patient, No Pcp Per (Inactive) Pharmacy:   [id number] [place], [place] - [address] [place] [address] Phone: [phone] Fax: [phone]  [facility] [address] [place] [address] Phone: [phone] Fax: [phone]  [facility] [address] [place] [address] Phone: [phone] Fax: [phone]     Social Determinants of Health (SDOH) Interventions    Readmission Risk Interventions No flowsheet data found.

## 2021-08-07 NOTE — Progress Notes (Signed)
[facility] PROGRESS NOTE  Tanya Fisher  KTG:[id number] DOB: 1995/07/23 DOA: 08/04/2021 PCP: Patient, No Pcp Per (Inactive) Outpatient Specialists: Hematology, Dr. Candise Che Brief Narrative: Tanya Fisher is a 26 y.o. female with medical history significant of DVT during pregnancy treated with Lovenox and subsequently discontinued after pregnancy presented 1/31 with worsening chest pain for the last few days. Troponins were 11 and 19.  EKG was unremarkable.  Chest x-ray showed no acute cardiopulmonary disease.  CTA of the chest showed acute right lower lobe segmental pulmonary embolism. Venous U/S revealed no LE DVT's. She was started on heparin drip.   Subjective: Chest pain remains, mild dyspnea at rest worse with exertion. No leg swelling, recent travel.   Objective: BP 133/88 (BP Location: Left Arm)    Pulse 61    Temp 98.4 F (36.9 C) (Oral)    Resp 16    Ht 5\' 3"  (1.6 m)    Wt 87.7 kg    LMP 08/03/2021    SpO2 99%    BMI 34.25 kg/m   Gen: Nontoxic Pulm: Clear, nonlabored mild tachypnea  CV: RRR, no murmur, no JVD, no edema GI: Soft, NT, ND, +BS  Neuro: Alert and oriented. No focal deficits. 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  - Convert to DOAC, CM consulted. Pt reporting significant menstrual bleeding at this time. Will monitor for worsening bleeding. - Discussed with hematology, Dr. 08/05/2021, who recommends no further work up at this time. Pt will follow up with Dr. Leonides Schanz who saw her previously.   Acute blood loss anemia: Likely related to menses.  - Monitor  Obesity: BMI  Candise Che, MD [facility] [email or web address] 08/07/2021, 3:02 PM

## 2021-08-19 ENCOUNTER — Telehealth (HOSPITAL_COMMUNITY): Payer: Self-pay

## 2021-08-19 ENCOUNTER — Other Ambulatory Visit (HOSPITAL_COMMUNITY): Payer: Self-pay

## 2021-08-19 NOTE — Telephone Encounter (Signed)
Transitions of Care Pharmacy  ° °Call attempted for a pharmacy transitions of care follow-up. HIPAA appropriate voicemail was left with call back information provided.  ° °Call attempt #1. Will follow-up in 2-3 days.  °  °

## 2021-08-20 ENCOUNTER — Telehealth (HOSPITAL_COMMUNITY): Payer: Self-pay | Admitting: Pharmacist

## 2021-08-20 NOTE — Telephone Encounter (Signed)
Transitions of Care Pharmacy  ? ?Call attempted for a pharmacy transitions of care follow-up. HIPAA appropriate voicemail was left with call back information provided.  ? ?Call attempt #2. Will follow-up in 1-3 days.  ? ? ?

## 2021-08-21 ENCOUNTER — Telehealth (HOSPITAL_COMMUNITY): Payer: Self-pay

## 2021-08-21 NOTE — Telephone Encounter (Signed)
Transitions of Care Pharmacy  ° °Call attempted for a pharmacy transitions of care follow-up. HIPAA appropriate voicemail was left with call back information provided.  ° °Call attempt #3. Will no longer attempt to follow up for TOC pharmacy.  ° ° ° °

## 2022-09-21 ENCOUNTER — Other Ambulatory Visit (HOSPITAL_COMMUNITY)
Admission: RE | Admit: 2022-09-21 | Discharge: 2022-09-21 | Disposition: A | Discharge status: Home / Self Care | Payer: Medicaid Other | Source: Ambulatory Visit | Attending: Certified Nurse Midwife | Admitting: Certified Nurse Midwife

## 2022-09-21 ENCOUNTER — Ambulatory Visit (INDEPENDENT_AMBULATORY_CARE_PROVIDER_SITE_OTHER): Payer: Medicaid Other

## 2022-09-21 ENCOUNTER — Other Ambulatory Visit: Payer: Self-pay

## 2022-09-21 ENCOUNTER — Other Ambulatory Visit: Payer: Self-pay | Admitting: Certified Nurse Midwife

## 2022-09-21 VITALS — BP 136/82 | Wt 179.0 lb

## 2022-09-21 DIAGNOSIS — Z113 Encounter for screening for infections with a predominantly sexual mode of transmission: Secondary | ICD-10-CM | POA: Insufficient documentation

## 2022-09-21 NOTE — Progress Notes (Signed)
Tanya Fisher is here for STD screening. Self swab instructions given and specimen obtained. Explained patient will be contacted with any abnormal results. STD blood labs drawn. Patient is due for annual exam; instructed to schedule, otherwise will not be considered established patient following April 2024.   Annabell Howells, RN 09/21/2022  4:56 PM

## 2022-09-23 ENCOUNTER — Other Ambulatory Visit: Payer: Self-pay | Admitting: Certified Nurse Midwife

## 2022-09-23 DIAGNOSIS — A599 Trichomoniasis, unspecified: Secondary | ICD-10-CM

## 2022-09-23 DIAGNOSIS — A749 Chlamydial infection, unspecified: Secondary | ICD-10-CM

## 2022-09-23 LAB — HEPATITIS B SURFACE ANTIGEN: Hepatitis B Surface Ag: NEGATIVE

## 2022-09-23 LAB — HEPATITIS C ANTIBODY: Hep C Virus Ab: NONREACTIVE

## 2022-09-23 LAB — CERVICOVAGINAL ANCILLARY ONLY
Chlamydia: POSITIVE — AB
Comment: NEGATIVE
Comment: NEGATIVE
Comment: NORMAL
Neisseria Gonorrhea: NEGATIVE
Trichomonas: POSITIVE — AB

## 2022-09-23 LAB — RPR: RPR Ser Ql: NONREACTIVE

## 2022-09-23 LAB — HIV ANTIBODY (ROUTINE TESTING W REFLEX): HIV Screen 4th Generation wRfx: NONREACTIVE

## 2022-09-23 MED ORDER — AZITHROMYCIN 250 MG PO TABS: 1000.0000 mg | ORAL_TABLET | Freq: Once | ORAL | 0 refills | Status: AC

## 2022-09-23 MED ORDER — METRONIDAZOLE 500 MG PO TABS: 500.0000 mg | ORAL_TABLET | Freq: Two times a day (BID) | ORAL | 0 refills | Status: DC

## 2022-09-29 ENCOUNTER — Telehealth: Payer: Self-pay | Admitting: Lactation Services

## 2022-09-29 NOTE — Telephone Encounter (Signed)
Attempted to call patient in regards to + Trich and Chlamydia. She did not answer. Medications were sent to Pharmacy and patient read result noted form Candie Chroman, CNM. STD report faxed to [facility]. Fax confirmation received.

## 2022-11-09 ENCOUNTER — Emergency Department (HOSPITAL_BASED_OUTPATIENT_CLINIC_OR_DEPARTMENT_OTHER): Payer: Medicaid Other

## 2022-11-09 ENCOUNTER — Observation Stay (HOSPITAL_BASED_OUTPATIENT_CLINIC_OR_DEPARTMENT_OTHER)
Admission: EM | Admit: 2022-11-09 | Discharge: 2022-11-11 | Disposition: A | Discharge status: Home / Self Care | Payer: Medicaid Other | Attending: Internal Medicine | Admitting: Internal Medicine

## 2022-11-09 ENCOUNTER — Encounter (HOSPITAL_BASED_OUTPATIENT_CLINIC_OR_DEPARTMENT_OTHER): Payer: Self-pay

## 2022-11-09 ENCOUNTER — Other Ambulatory Visit: Payer: Self-pay

## 2022-11-09 DIAGNOSIS — I10 Essential (primary) hypertension: Secondary | ICD-10-CM | POA: Diagnosis not present

## 2022-11-09 DIAGNOSIS — R001 Bradycardia, unspecified: Secondary | ICD-10-CM | POA: Diagnosis not present

## 2022-11-09 DIAGNOSIS — E538 Deficiency of other specified B group vitamins: Secondary | ICD-10-CM | POA: Diagnosis not present

## 2022-11-09 DIAGNOSIS — D649 Anemia, unspecified: Secondary | ICD-10-CM | POA: Insufficient documentation

## 2022-11-09 DIAGNOSIS — D500 Iron deficiency anemia secondary to blood loss (chronic): Secondary | ICD-10-CM | POA: Insufficient documentation

## 2022-11-09 DIAGNOSIS — E663 Overweight: Secondary | ICD-10-CM | POA: Diagnosis not present

## 2022-11-09 DIAGNOSIS — I951 Orthostatic hypotension: Secondary | ICD-10-CM | POA: Insufficient documentation

## 2022-11-09 DIAGNOSIS — Z79899 Other long term (current) drug therapy: Secondary | ICD-10-CM | POA: Diagnosis not present

## 2022-11-09 DIAGNOSIS — Z87891 Personal history of nicotine dependence: Secondary | ICD-10-CM | POA: Insufficient documentation

## 2022-11-09 DIAGNOSIS — Z86711 Personal history of pulmonary embolism: Secondary | ICD-10-CM | POA: Diagnosis not present

## 2022-11-09 DIAGNOSIS — Z6828 Body mass index (BMI) 28.0-28.9, adult: Secondary | ICD-10-CM | POA: Diagnosis not present

## 2022-11-09 DIAGNOSIS — R55 Syncope and collapse: Secondary | ICD-10-CM | POA: Diagnosis not present

## 2022-11-09 DIAGNOSIS — N920 Excessive and frequent menstruation with regular cycle: Secondary | ICD-10-CM | POA: Diagnosis not present

## 2022-11-09 DIAGNOSIS — Z7901 Long term (current) use of anticoagulants: Secondary | ICD-10-CM | POA: Insufficient documentation

## 2022-11-09 DIAGNOSIS — J45909 Unspecified asthma, uncomplicated: Secondary | ICD-10-CM | POA: Diagnosis not present

## 2022-11-09 DIAGNOSIS — Z86718 Personal history of other venous thrombosis and embolism: Secondary | ICD-10-CM | POA: Diagnosis not present

## 2022-11-09 DIAGNOSIS — I2699 Other pulmonary embolism without acute cor pulmonale: Secondary | ICD-10-CM

## 2022-11-09 LAB — PREGNANCY, URINE: Preg Test, Ur: NEGATIVE

## 2022-11-09 LAB — CBC
HCT: 39.5 % (ref 36.0–46.0)
Hemoglobin: 12.6 g/dL (ref 12.0–15.0)
MCH: 27.9 pg (ref 26.0–34.0)
MCHC: 31.9 g/dL (ref 30.0–36.0)
MCV: 87.4 fL (ref 80.0–100.0)
Platelets: 302 10*3/uL (ref 150–400)
RBC: 4.52 MIL/uL (ref 3.87–5.11)
RDW: 16.1 % — ABNORMAL HIGH (ref 11.5–15.5)
WBC: 4.6 10*3/uL (ref 4.0–10.5)
nRBC: 0 % (ref 0.0–0.2)

## 2022-11-09 LAB — COMPREHENSIVE METABOLIC PANEL
ALT: 13 U/L (ref 0–44)
AST: 16 U/L (ref 15–41)
Albumin: 4.4 g/dL (ref 3.5–5.0)
Alkaline Phosphatase: 70 U/L (ref 38–126)
Anion gap: 11 (ref 5–15)
BUN: 6 mg/dL (ref 6–20)
CO2: 23 mmol/L (ref 22–32)
Calcium: 9.9 mg/dL (ref 8.9–10.3)
Chloride: 105 mmol/L (ref 98–111)
Creatinine, Ser: 0.87 mg/dL (ref 0.44–1.00)
GFR, Estimated: >60 mL/min (ref >60)
Glucose, Bld: 88 mg/dL (ref 70–99)
Potassium: 3.5 mmol/L (ref 3.5–5.1)
Sodium: 139 mmol/L (ref 135–145)
Total Bilirubin: 1.3 mg/dL — ABNORMAL HIGH (ref 0.3–1.2)
Total Protein: 8.3 g/dL — ABNORMAL HIGH (ref 6.5–8.1)

## 2022-11-09 LAB — URINALYSIS, ROUTINE W REFLEX MICROSCOPIC
Bacteria, UA: NONE SEEN
Bilirubin Urine: NEGATIVE
Glucose, UA: NEGATIVE mg/dL
Hgb urine dipstick: NEGATIVE
Leukocytes,Ua: NEGATIVE
Nitrite: NEGATIVE
Specific Gravity, Urine: 1.018 (ref 1.005–1.030)
pH: 7.5 (ref 5.0–8.0)

## 2022-11-09 LAB — TROPONIN I (HIGH SENSITIVITY)
Troponin I (High Sensitivity): 4 ng/L (ref <18)
Troponin I (High Sensitivity): 4 ng/L (ref <18)

## 2022-11-09 LAB — CBG MONITORING, ED: Glucose-Capillary: 102 mg/dL — ABNORMAL HIGH (ref 70–99)

## 2022-11-09 LAB — LIPASE, BLOOD: Lipase: <10 U/L — ABNORMAL LOW (ref 11–51)

## 2022-11-09 LAB — TSH: TSH: 0.521 u[IU]/mL (ref 0.350–4.500)

## 2022-11-09 MED ORDER — LACTATED RINGERS IV SOLN: INTRAVENOUS | Status: AC

## 2022-11-09 MED ORDER — ENOXAPARIN SODIUM 40 MG/0.4ML IJ SOSY
40.0000 mg | PREFILLED_SYRINGE | INTRAMUSCULAR | Status: DC
  Administered 2022-11-10 – 2022-11-11 (×2): 40 mg via SUBCUTANEOUS
  Filled 2022-11-09 (×3): qty 0.4

## 2022-11-09 MED ORDER — DIPHENHYDRAMINE HCL 50 MG/ML IJ SOLN
25.0000 mg | Freq: Once | INTRAMUSCULAR | Status: AC
  Administered 2022-11-09: 25 mg via INTRAVENOUS
  Filled 2022-11-09: qty 1

## 2022-11-09 MED ORDER — SODIUM CHLORIDE 0.9 % IV BOLUS
1000.0000 mL | Freq: Once | INTRAVENOUS | Status: AC
  Administered 2022-11-09: 1000 mL via INTRAVENOUS

## 2022-11-09 MED ORDER — ACETAMINOPHEN 325 MG PO TABS
650.0000 mg | ORAL_TABLET | Freq: Four times a day (QID) | ORAL | Status: DC | PRN
  Administered 2022-11-10 – 2022-11-11 (×3): 650 mg via ORAL
  Filled 2022-11-09 (×2): qty 2

## 2022-11-09 MED ORDER — POLYETHYLENE GLYCOL 3350 17 G PO PACK: 17.0000 g | PACK | Freq: Every day | ORAL | Status: DC | PRN

## 2022-11-09 MED ORDER — IOHEXOL 350 MG/ML SOLN
100.0000 mL | Freq: Once | INTRAVENOUS | Status: AC | PRN
  Administered 2022-11-09: 75 mL via INTRAVENOUS

## 2022-11-09 MED ORDER — HYDRALAZINE HCL 20 MG/ML IJ SOLN: 2.0000 mg | Freq: Four times a day (QID) | INTRAMUSCULAR | Status: DC | PRN

## 2022-11-09 MED ORDER — PROCHLORPERAZINE EDISYLATE 10 MG/2ML IJ SOLN: 5.0000 mg | Freq: Four times a day (QID) | INTRAMUSCULAR | Status: DC | PRN

## 2022-11-09 NOTE — H&P (Incomplete)
History and Physical  Tanya Fisher ZOX:[id number] DOB: July 02, 1996 DOA: 11/09/2022  Referring physician: Accepted by Dr. Sherrell Puller, Hospitalist service  PCP: Patient, No Pcp Per  Outpatient Specialists: OBGYN Patient coming from: Home  Chief Complaint: Low heart rate and fatigue   HPI: Tanya Fisher is a 27 y.o. female with medical history significant for DVT during pregnancy in 2018 treated with Lovenox and subsequently discontinued after pregnancy, presented on 08/04/2021 with unprovoked PE she was started on heparin drip and transitioned to Eliquis.  Was discharged on Eliquis and advised to follow-up with hematology oncology outpatient.  Unclear if she followed up with hematology oncology.  The patient is currently not on blood thinners and is not on any prescribed medications.  Endorses 2 weeks ago she had an episode of weakness sweatiness and thinks her heart rate was low at the time.  She had another episode today at work yesterday at work where she felt very weak could not walk heart palpitations.  She decided to present to the ED for further evaluation.    In the ED, she was noted to be profoundly bradycardic with heart rate in the 30s to 40s.  Workup essentially has been unremarkable.  She had a CT angio chest that was negative for pulmonary embolism.  TSH was normal troponins were normal.  EKG revealed sinus bradycardia with PACs, rate of 42 and QTc of 398.  EDP discussed the case with cardiology who recommended admission for further workup.  The patient was admitted by [facility], hospitalist service and accepted by Dr. Antionette Char, and transferred to [facility] progressive care unit as observation status.  ED Course: Temperature 97.8.  BP 149/102, pulse 42, respiration rate 20.  O2 saturation 100% on room air.  Lab studies remarkable for elevated T. bili 1.3.  Rest of labs essentially unremarkable.  TSH 0.521, pregnancy test negative, glucose 88, high-sensitivity troponin 4, repeat 4.   Potassium 3.5.  Review of Systems: Review of systems as noted in the HPI. All other systems reviewed and are negative.   Past Medical History:  Diagnosis Date   Asthma    DVT (deep vein thrombosis) in pregnancy    Gestational diabetes    Herpes    Past Surgical History:  Procedure Laterality Date   CESAREAN SECTION     CESAREAN SECTION N/A 04/24/2019   Procedure: CESAREAN SECTION;  Surgeon: Levie Heritage, DO;  Location: [facility] LD ORS;  Service: Obstetrics;  Laterality: N/A;   DILATION AND EVACUATION     Therapeutic abortion 14 wks    Social History:  reports that she quit smoking about 7 years ago. Her smoking use included cigarettes. She has never used smokeless tobacco. She reports current drug use. Frequency: 1.00 time per week. Drug: Marijuana. She reports that she does not drink alcohol.   No Known Allergies  Family History  Problem Relation Age of Onset   Hypertension Mother    Diabetes Father    Hypertension Father     Paternal aunt deceased at the age of 76 from complication of heart disease.  Prior to Admission medications   Medication Sig Start Date End Date Taking? Authorizing Provider  APIXABAN Everlene Balls) VTE STARTER PACK (10MG  AND 5MG ) Take as directed on package: start with two-5mg  tablets twice daily for 7 days. On day 8, switch to one-5mg  tablet twice daily. 08/05/21   Tyrone Nine, MD  metroNIDAZOLE (FLAGYL) 500 MG tablet Take 1 tablet (500 mg total) by mouth 2 (two)  times daily. 09/23/22   Bernerd Limbo, CNM    Physical Exam: BP (!) 149/102 (BP Location: Right Arm)   Pulse (!) 47   Temp 97.8 F (36.6 C) (Oral)   Resp 14   Ht 5\' 3"  (1.6 m)   Wt 73.9 kg   SpO2 100%   BMI 28.86 kg/m   General: 27 y.o. year-old female well developed well nourished in no acute distress.  Alert and oriented x3. Cardiovascular: Bradycardic with no rubs or gallops.  No thyromegaly or JVD noted.  No lower extremity edema. 2/4 pulses in all 4 extremities. Respiratory:  Clear to auscultation with no wheezes or rales. Good inspiratory effort. Abdomen: Soft nontender nondistended with normal bowel sounds x4 quadrants. Muskuloskeletal: No cyanosis, clubbing or edema noted bilaterally Neuro: CN II-XII intact, strength, sensation, reflexes Skin: No ulcerative lesions noted or rashes Psychiatry: Judgement and insight appear normal. Mood is appropriate for condition and setting          Labs on Admission:  Basic Metabolic Panel: Recent Labs  Lab 11/09/22 1618  NA 139  K 3.5  CL 105  CO2 23  GLUCOSE 88  BUN 6  CREATININE 0.87  CALCIUM 9.9   Liver Function Tests: Recent Labs  Lab 11/09/22 1618  AST 16  ALT 13  ALKPHOS 70  BILITOT 1.3*  PROT 8.3*  ALBUMIN 4.4   Recent Labs  Lab 11/09/22 1618  LIPASE <10*   No results for input(s): "AMMONIA" in the last 168 hours. CBC: Recent Labs  Lab 11/09/22 1618  WBC 4.6  HGB 12.6  HCT 39.5  MCV 87.4  PLT 302   Cardiac Enzymes: No results for input(s): "CKTOTAL", "CKMB", "CKMBINDEX", "TROPONINI" in the last 168 hours.  BNP (last 3 results) No results for input(s): "BNP" in the last 8760 hours.  ProBNP (last 3 results) No results for input(s): "PROBNP" in the last 8760 hours.  CBG: Recent Labs  Lab 11/09/22 1608  GLUCAP 102*    Radiological Exams on Admission: CT Angio Chest PE W and/or Wo Contrast  Result Date: 11/09/2022 CLINICAL DATA:  Fatigue, high probability of pulmonary embolism. EXAM: CT ANGIOGRAPHY CHEST WITH CONTRAST TECHNIQUE: Multidetector CT imaging of the chest was performed using the standard protocol during bolus administration of intravenous contrast. Multiplanar CT image reconstructions and MIPs were obtained to evaluate the vascular anatomy. RADIATION DOSE REDUCTION: This exam was performed according to the departmental dose-optimization program which includes automated exposure control, adjustment of the mA and/or kV according to patient size and/or use of iterative  reconstruction technique. CONTRAST:  75mL OMNIPAQUE IOHEXOL 350 MG/ML SOLN COMPARISON:  August 04, 2021. FINDINGS: Cardiovascular: Satisfactory opacification of the pulmonary arteries to the segmental level. No evidence of pulmonary embolism. Normal heart size. No pericardial effusion. Mediastinum/Nodes: No enlarged mediastinal, hilar, or axillary lymph nodes. Thyroid gland, trachea, and esophagus demonstrate no significant findings. Lungs/Pleura: Lungs are clear. No pleural effusion or pneumothorax. Upper Abdomen: No acute abnormality. Musculoskeletal: No chest wall abnormality. No acute or significant osseous findings. Review of the MIP images confirms the above findings. IMPRESSION: No definite evidence of pulmonary embolus. No acute abnormality seen in the chest. Electronically Signed   By: Lupita Raider M.D.   On: 11/09/2022 18:02    EKG: I independently viewed the EKG done and my findings are as followed: Sinus pericardia rate of 42.  PACs, QTc 398.  Assessment/Plan Present on Admission:  Near syncope  Principal Problem:   Near syncope  Near syncope  suspect secondary to symptomatic bradycardia Unclear etiology Continue to monitor on telemetry TSH, high-sensitivity troponin, electrolytes, normal Follow 2D echo Cardiology consulted.  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 Was advised to follow-up with hematology oncology at last admission CTA PE negative on 11/09/2022. Bilateral lower extremity Doppler ultrasound ordered on 11/09/2022 to rule out DVT. Hematology oncology consult for a definitive recommendation on treatment  Overweight BMI 28 Recommend weight loss outpatient with regular physical activity and healthy dieting.   DVT prophylaxis: Subcu Lovenox daily  Code  Status: Full code  Family Communication: Significant other at bedside.  Disposition Plan: Admitted to progressive care unit  Consults called: Cardiology, hematology oncology  Admission status: Observation status.   Status is: Observation    Darlin Drop MD [facility] Pager [phone]  If 7PM-7AM, please contact night-coverage [email or web address] Password [email or web address]  11/09/2022, 10:08 PM

## 2022-11-09 NOTE — Progress Notes (Signed)
Discussed case with ED team.  27 year old female with a history of unprovoked DVT and RLL segmental PE in 07/2021 not currently on anticoagulation who presents with reportedly 3 days of nausea, fatigue and near syncope with standing.  Her workup thus far has been largely unremarkable including a normal CBC, CMP, TSH, troponin x 2.  Pregnancy test negative.  CT PE scan negative for PE.  EKG was sinus bradycardia.  She was administered IVF.  Currently asymptomatic per report.  Informed team that a formal evaluation will occur in a.m.   Should remain on telemetry with EKG if changes to rhythm.  No further recommendations at this time.  Marya Amsler, MD Cardiology Fellow

## 2022-11-09 NOTE — ED Triage Notes (Signed)
Patient here POV from Home.  Endorses Fatigue and Nausea that began Last PM. No Vomiting, Diarrhea, or Fevers. No Pain.  Tearful during Triage. A&Ox4. GCS 15. BIB Wheelchair.

## 2022-11-09 NOTE — Plan of Care (Signed)

## 2022-11-09 NOTE — ED Provider Notes (Signed)
[place] EMERGENCY DEPARTMENT AT [facility] Provider Note   CSN: [id number] Arrival date & time: 11/09/22  1555     History  Chief Complaint  Patient presents with   Fatigue    Tanya Fisher is a 27 y.o. female for with a past medical history of DVT during pregnancy and unprovoked pulmonary embolus 1 year ago who presents emergency department for weakness and near syncope.  Patient reports that 2 weeks ago she had onset of near syncope last staying for about 3 days.  Patient states that she felt like she is going to pass out every time she stood up.  She had associated exertional dyspnea and shortness of breath.  Patient states that she felt very better lying on the floor and felt horrible every time she tried to stand up.  Patient states that she seemed to improve over the 3 days and was able to get back to normal.  Yesterday was her birthday and while at work she had return of the same symptoms.  She had have a friend drive her home to work.  She did not drink excessively.  She had had a small glass of wine.  She states that since that time every time she stands up she feels weak, dizzy, short of breath, diaphoretic.  She denies racing or skipping in her heart, heavy periods, hematochezia.  Patient is not on any blood thinners.  She reports that she followed up with Dr. Candise Che during her first  HPI     Home Medications Prior to Admission medications   Medication Sig Start Date End Date Taking? Authorizing Provider  APIXABAN Everlene Balls) VTE STARTER PACK (10MG  AND 5MG ) Take as directed on package: start with two-5mg  tablets twice daily for 7 days. On day 8, switch to one-5mg  tablet twice daily. 08/05/21   Tyrone Nine, MD  metroNIDAZOLE (FLAGYL) 500 MG tablet Take 1 tablet (500 mg total) by mouth 2 (two) times daily. 09/23/22   Bernerd Limbo, CNM      Allergies    Patient has no known allergies.    Review of Systems   Review of Systems  Physical Exam Updated Vital  Signs BP (!) 148/108 (BP Location: Right Arm)   Pulse 67   Temp 98.3 F (36.8 C) (Oral)   Resp 18   Ht 5\' 4"  (1.626 m)   Wt 74.4 kg   SpO2 98%   BMI 28.15 kg/m  Physical Exam Vitals and nursing note reviewed.  Constitutional:      General: She is not in acute distress.    Appearance: She is well-developed. She is not diaphoretic.  HENT:     Head: Normocephalic and atraumatic.     Right Ear: External ear normal.     Left Ear: External ear normal.     Nose: Nose normal.     Mouth/Throat:     Mouth: Mucous membranes are moist.  Eyes:     General: No scleral icterus.    Extraocular Movements: Extraocular movements intact.     Conjunctiva/sclera: Conjunctivae normal.     Pupils: Pupils are equal, round, and reactive to light.  Cardiovascular:     Rate and Rhythm: Normal rate and regular rhythm.     Heart sounds: Normal heart sounds. No murmur heard.    No friction rub. No gallop.  Pulmonary:     Effort: Pulmonary effort is normal. No respiratory distress.     Breath sounds: Normal breath sounds.  Comments: Patient feels dizzy and weak when going from lying to sitting for pulmonary exam Abdominal:     General: Bowel sounds are normal. There is no distension.     Palpations: Abdomen is soft. There is no mass.     Tenderness: There is no abdominal tenderness. There is no guarding.  Musculoskeletal:     Cervical back: Normal range of motion.     Right lower leg: No edema.     Left lower leg: No edema.  Skin:    General: Skin is warm and dry.  Neurological:     General: No focal deficit present.     Mental Status: She is alert and oriented to person, place, and time.     Cranial Nerves: No cranial nerve deficit.     Sensory: No sensory deficit.     Motor: No weakness.     Coordination: Coordination normal.     Gait: Gait normal.     Deep Tendon Reflexes: Reflexes normal.  Psychiatric:        Behavior: Behavior normal.     ED Results / Procedures / Treatments    Labs (all labs ordered are listed, but only abnormal results are displayed) Labs Reviewed  COMPREHENSIVE METABOLIC PANEL - Abnormal; Notable for the following components:      Result Value   Total Protein 8.3 (*)    Total Bilirubin 1.3 (*)    All other components within normal limits  CBC - Abnormal; Notable for the following components:   RDW 16.1 (*)    All other components within normal limits  URINALYSIS, ROUTINE W REFLEX MICROSCOPIC - Abnormal; Notable for the following components:   APPearance HAZY (*)    Ketones, ur TRACE (*)    Protein, ur TRACE (*)    All other components within normal limits  LIPASE, BLOOD - Abnormal; Notable for the following components:   Lipase <10 (*)    All other components within normal limits  CBG MONITORING, ED - Abnormal; Notable for the following components:   Glucose-Capillary 102 (*)    All other components within normal limits  PREGNANCY, URINE  D-DIMER, QUANTITATIVE (NOT AT [facility])  TROPONIN I (HIGH SENSITIVITY)    EKG None  Radiology No results found.  Procedures Procedures    Medications Ordered in ED Medications - No data to display  ED Course/ Medical Decision Making/ A&P                             Medical Decision Making Amount and/or Complexity of Data Reviewed Labs: ordered. Radiology: ordered. ECG/medicine tests: ordered.  Risk Prescription drug management.   43 old female with a history of unprovoked DVT not on anticoagulation presents to the emergency department with chief complaint of near syncope for several days.  This is the second time this has occurred.  Most notably patient is bradycardic The differential for syncope is extensive and includes, but is not limited to: arrythmia (Vtach, SVT, SSS, sinus arrest, AV block, bradycardia) aortic stenosis, AMI, HOCM, PE, atrial myxoma, pulmonary hypertension, orthostatic hypotension, (hypovolemia, drug effect, GB syndrome, micturition, cough, swall) carotid sinus  sensitivity, Seizure, TIA/CVA, hypoglycemia,  Vertigo. ` I ordered lab shows troponin within normal limits, CMP and CBC without significant abnormality.  There are some ketones in her urine suggestive of poor oral intake. Pregnancy test is negative.   I ordered CT angiogram of the chest which shows no evidence of acute pulmonary  embolus, dissection or other acute abnormality.  I individually interpreted this scan  I have ordered a TSH level.  Patient does have symptomatic bradycardia along with orthostatic hypotension.  Fluids ordered.  Resting heart rate is in the 40s. I note given to PA [facility] at shift handoff for the patient will likely need a cardiology consult..        Final Clinical Impression(s) / ED Diagnoses Final diagnoses:  None    Rx / DC Orders ED Discharge Orders     None         Arthor Captain, PA-C 11/09/22 Windell Moment    Ernie Avena, MD 11/10/22 1206

## 2022-11-09 NOTE — ED Provider Notes (Signed)
  Physical Exam  BP (!) 142/94   Pulse (!) 41   Temp 98 F (36.7 C)   Resp 14   Ht 5\' 4"  (1.626 m)   Wt 74.4 kg   SpO2 100%   BMI 28.15 kg/m   Physical Exam Vitals and nursing note reviewed.  Constitutional:      Appearance: Normal appearance.  Eyes:     General: No scleral icterus. Cardiovascular:     Rate and Rhythm: Bradycardia present.  Pulmonary:     Effort: Pulmonary effort is normal. No respiratory distress.  Skin:    General: Skin is dry.     Findings: No rash.  Neurological:     Mental Status: She is alert. Mental status is at baseline.     Procedures  Procedures  ED Course / MDM   Clinical Course as of 11/09/22 2116  Tue Nov 09, 2022  2109 On the phone with  [RR]    Clinical Course User Index [RR] Achille Rich, PA-C   Medical Decision Making Amount and/or Complexity of Data Reviewed Labs: ordered. Radiology: ordered. ECG/medicine tests: ordered.  Risk Prescription drug management. Decision regarding hospitalization.   Accepted handoff at shift change from [facility], PA-C. Please see prior provider note for more detail.   Briefly: Patient is 27 y.o. F presenting to the ER for evaluation of near syncopal events and was found to be bradycardic. She has a h/o a multiple unprovoked DVTs and PE and is not on a blood thinner.   DDX: concern for symptomatic bradycardia  Plan: Consult cardiology. Likely admission.  Discussed plan with patient about possible admission. Patient is agreeable.   My attending spoke with Dr. Antionette Char who will admit. I spoke with Dr. Marya Amsler with cardiology. He does not have any overnight recommendations, but someone will formally consult with her in the morning. Patient is still bradycardic, but feeling ok. Awaiting [facility] transportation.        Achille Rich, PA-C 11/09/22 2117    Ernie Avena, MD 11/10/22 [phone]

## 2022-11-09 NOTE — Progress Notes (Signed)
Plan of Care Note for accepted transfer   Patient: Tanya Fisher MRN: [id number]   DOA: 11/09/2022  Facility requesting transfer: [facility]   Requesting Provider: Dr. Karene Fry   Reason for transfer: Near syncope   Facility course: 27 yr old female with hx of DVT and PE no longer anticoagulated who presents with fatigue, nausea, and near-syncope on standing.   CBC and CMP are unremarkable and troponin normal x2. EKG demonstrates sinus bradycardia with rate 42 and PAC. CTA chest negative for PE or other acute findings.   She was given a liter of NS in ED and cardiology was paged. ED physician will document cardiology recommendation in their note.   Plan of care: The patient is accepted for admission to Telemetry unit, at [facility].   Author: Briscoe Deutscher, MD 11/09/2022  Check [email or web address] for on-call coverage.  Nursing staff, Please call [facility] Admits & Consults System-Wide number on Amion as soon as patient's arrival, so appropriate admitting provider Fisher evaluate the pt.

## 2022-11-10 ENCOUNTER — Observation Stay (HOSPITAL_BASED_OUTPATIENT_CLINIC_OR_DEPARTMENT_OTHER): Payer: Medicaid Other

## 2022-11-10 DIAGNOSIS — I10 Essential (primary) hypertension: Secondary | ICD-10-CM | POA: Diagnosis not present

## 2022-11-10 DIAGNOSIS — D649 Anemia, unspecified: Secondary | ICD-10-CM | POA: Diagnosis not present

## 2022-11-10 DIAGNOSIS — R9431 Abnormal electrocardiogram [ECG] [EKG]: Secondary | ICD-10-CM | POA: Diagnosis not present

## 2022-11-10 DIAGNOSIS — R001 Bradycardia, unspecified: Secondary | ICD-10-CM

## 2022-11-10 DIAGNOSIS — Z86711 Personal history of pulmonary embolism: Secondary | ICD-10-CM | POA: Diagnosis not present

## 2022-11-10 DIAGNOSIS — I150 Renovascular hypertension: Secondary | ICD-10-CM | POA: Diagnosis not present

## 2022-11-10 DIAGNOSIS — R55 Syncope and collapse: Secondary | ICD-10-CM | POA: Diagnosis not present

## 2022-11-10 DIAGNOSIS — R030 Elevated blood-pressure reading, without diagnosis of hypertension: Secondary | ICD-10-CM | POA: Diagnosis not present

## 2022-11-10 LAB — BASIC METABOLIC PANEL
Anion gap: 5 (ref 5–15)
BUN: 6 mg/dL (ref 6–20)
CO2: 23 mmol/L (ref 22–32)
Calcium: 8.5 mg/dL — ABNORMAL LOW (ref 8.9–10.3)
Chloride: 109 mmol/L (ref 98–111)
Creatinine, Ser: 0.92 mg/dL (ref 0.44–1.00)
GFR, Estimated: >60 mL/min (ref >60)
Glucose, Bld: 114 mg/dL — ABNORMAL HIGH (ref 70–99)
Potassium: 3.7 mmol/L (ref 3.5–5.1)
Sodium: 137 mmol/L (ref 135–145)

## 2022-11-10 LAB — ECHOCARDIOGRAM COMPLETE
AR max vel: 2.63 cm2
AV Area VTI: 2.6 cm2
AV Area mean vel: 2.51 cm2
AV Mean grad: 3 mmHg
AV Peak grad: 6.1 mmHg
Ao pk vel: 1.23 m/s
Area-P 1/2: 3.68 cm2
Height: 63 in
MV M vel: 2.24 m/s
MV Peak grad: 20.1 mmHg
S' Lateral: 2.7 cm
Single Plane A4C EF: 62.1 %
Weight: 2606.4 oz

## 2022-11-10 LAB — CBC
HCT: 32.4 % — ABNORMAL LOW (ref 36.0–46.0)
Hemoglobin: 10.3 g/dL — ABNORMAL LOW (ref 12.0–15.0)
MCH: 27.8 pg (ref 26.0–34.0)
MCHC: 31.8 g/dL (ref 30.0–36.0)
MCV: 87.6 fL (ref 80.0–100.0)
Platelets: 257 10*3/uL (ref 150–400)
RBC: 3.7 MIL/uL — ABNORMAL LOW (ref 3.87–5.11)
RDW: 15.9 % — ABNORMAL HIGH (ref 11.5–15.5)
WBC: 5.3 10*3/uL (ref 4.0–10.5)
nRBC: 0 % (ref 0.0–0.2)

## 2022-11-10 LAB — PHOSPHORUS: Phosphorus: 2.8 mg/dL (ref 2.5–4.6)

## 2022-11-10 LAB — MAGNESIUM: Magnesium: 2 mg/dL (ref 1.7–2.4)

## 2022-11-10 NOTE — Consult Note (Signed)
Cardiology Consultation   Patient ID: JANDRA ARVIDSON MRN: [id number]; DOB: 16-Nov-1995  Admit date: 11/09/2022 Date of Consult: 11/10/2022  PCP:  Patient, No Pcp Per   [facility] Providers Cardiologist:  None   {   Patient Profile:   NIMRAT KNOBLAUCH is a 27 y.o. female with a hx of DVT during pregnancy 2018, PE 07/2021, who is being seen 11/10/2022 for the evaluation of bradycardia and weakness at the request of Dr. Rito Ehrlich.  History of Present Illness:   Ms. Pisarczyk has no prior cardiac history.  Patient has history of DVT during pregnancy treated with lovonox x 6 weeks and most recently pulmonary embolism (07/2021) in which she was prescribed Eliquis.  She completed 2 months of Eliquis and did not follow-up with heme-onc and not taking any medications.  Yesterday 11/09/2022 patient was admitted due to concerns of weakness/bradycardia with heart rate in the 30s and 40s upon presentation.  Patient states for the past 2 weeks she has been having these episodes in which she feels very weak, has hot flashes, short of breath, and has heart palpitations.  These episodes appear to be generally with activity.  Her first occurrence happened 2 weeks ago and lasted for few days and then resolved spontaneously.  And then once again 2 days ago patient states she has had the same symptoms that brought her to the emergency room.  Patient denies any close family history of any cardiac disease.  Patient is denying any other viral symptoms such as cough, congestion, fever.  She has not been around anybody sick.  She denies any chest pain, peripheral edema, syncopal episodes. Patient does state that she has had irregular periods for the past week that have lasted longer and have been associated with clots.  Patient has history of being iron deficient during pregnancy and required iron transfusions.  EKG showing sinus bradycardia, heart rate 42.  PR interval 205. Similar to prior readings. CTA negative.   On telemetry patient shows sinus bradycardia/sinus arrhythmia with heart rates that are fluctuating in the 40s to the 80s.  Hemoglobin has dropped from 12.6-10.3.  RBC 3.7.  TSH normal.  Past Medical History:  Diagnosis Date   Asthma    DVT (deep vein thrombosis) in pregnancy    Gestational diabetes    Herpes     Past Surgical History:  Procedure Laterality Date   CESAREAN SECTION     CESAREAN SECTION N/A 04/24/2019   Procedure: CESAREAN SECTION;  Surgeon: Levie Heritage, DO;  Location: [facility] LD ORS;  Service: Obstetrics;  Laterality: N/A;   DILATION AND EVACUATION     Therapeutic abortion 14 wks    Inpatient Medications: Scheduled Meds:  enoxaparin (LOVENOX) injection  40 mg Subcutaneous Q24H   Continuous Infusions:  lactated ringers 50 mL/hr at 11/09/22 2304   PRN Meds: acetaminophen, hydrALAZINE, polyethylene glycol, prochlorperazine  Allergies:   No Known Allergies  Social History:   Social History   Socioeconomic History   Marital status: Single    Spouse name: Not on file   Number of children: Not on file   Years of education: Not on file   Highest education level: Not on file  Occupational History   Not on file  Tobacco Use   Smoking status: Former    Types: Cigarettes    Quit date: 07/06/2015    Years since quitting: 7.3   Smokeless tobacco: Never  Vaping Use   Vaping Use: Never used  Substance and  Sexual Activity   Alcohol use: No    Alcohol/week: 0.0 standard drinks of alcohol   Drug use: Yes    Frequency: 1.0 times per week    Types: Marijuana   Sexual activity: Yes    Partners: Male    Birth control/protection: None  Other Topics Concern   Not on file  Social History Narrative   Not on file   Social Determinants of Health   Financial Resource Strain: Not on file  Food Insecurity: Not on file  Transportation Needs: Not on file  Physical Activity: Not on file  Stress: Not on file  Social Connections: Not on file  Intimate Partner Violence:  Not At Risk (01/29/2019)   Humiliation, Afraid, Rape, and Kick questionnaire    Fear of Current or Ex-Partner: No    Emotionally Abused: No    Physically Abused: No    Sexually Abused: No    Family History:   Family History  Problem Relation Age of Onset   Hypertension Mother    Diabetes Father    Hypertension Father      ROS:  Please see the history of present illness. All other ROS reviewed and negative.     Physical Exam/Data:   Vitals:   11/09/22 1930 11/09/22 2000 11/09/22 2151 11/10/22 0447  BP: (!) 146/86 (!) 142/94 (!) 149/102 117/83  Pulse: (!) 42 (!) 41 (!) 47 62  Resp: 20 14  16   Temp:  98 F (36.7 C) 97.8 F (36.6 C) 98.6 F (37 C)  TempSrc:   Oral Oral  SpO2: 100% 100% 100% 100%  Weight:   73.9 kg   Height:   5\' 3"  (1.6 m)     Intake/Output Summary (Last 24 hours) at 11/10/2022 0746 Last data filed at 11/09/2022 2328 Gross per 24 hour  Intake 1259.21 ml  Output --  Net 1259.21 ml      11/09/2022    9:51 PM 11/09/2022    4:02 PM 09/21/2022    3:51 PM  Last 3 Weights  Weight (lbs) 162 lb 14.4 oz 164 lb 179 lb  Weight (kg) 73.891 kg 74.39 kg 81.194 kg     Body mass index is 28.86 kg/m.  General:  Well nourished, well developed, in no acute distress HEENT: normal Neck: no JVD Vascular: No carotid bruits; Distal pulses 2+ bilaterally Cardiac:  normal S1, S2; RRR; no murmur  Lungs:  clear to auscultation bilaterally, no wheezing, rhonchi or rales  Abd: soft, nontender, no hepatomegaly  Ext: no edema Musculoskeletal:  No deformities, BUE and BLE strength normal and equal Skin: warm and dry  Neuro:  CNs 2-12 intact, no focal abnormalities noted Psych:  Normal affect   EKG:  The EKG was personally reviewed and demonstrates:  EKG showing sinus bradycardia, heart rate 42.  Prolonged PR interval 205. Generally unchanged from prior reading. Telemetry:  Telemetry was personally reviewed and demonstrates:  Sinus arrhthymias HR 45-80   Relevant CV  Studies: Echocardiogram 08/05/2021 1. Left ventricular ejection fraction, by estimation, is 60 to 65%. The  left ventricle has normal function. The left ventricle has no regional  wall motion abnormalities. Left ventricular diastolic parameters were  normal.   2. Right ventricular systolic function is normal. The right ventricular  size is normal. There is normal pulmonary artery systolic pressure.   3. The mitral valve is normal in structure. Trivial mitral valve  regurgitation. No evidence of mitral stenosis.   4. The aortic valve is normal in structure.  Aortic valve regurgitation is  not visualized. No aortic stenosis is present.   5. The inferior vena cava is normal in size with greater than 50%  respiratory variability, suggesting right atrial pressure of 3 mmHg.    Laboratory Data:  High Sensitivity Troponin:   Recent Labs  Lab 11/09/22 1622 11/09/22 1908  TROPONINIHS 4 4     Chemistry Recent Labs  Lab 11/09/22 1618 11/10/22 0151  NA 139 137  K 3.5 3.7  CL 105 109  CO2 23 23  GLUCOSE 88 114*  BUN 6 6  CREATININE 0.87 0.92  CALCIUM 9.9 8.5*  MG  --  2.0  GFRNONAA >60 >60  ANIONGAP 11 5    Recent Labs  Lab 11/09/22 1618  PROT 8.3*  ALBUMIN 4.4  AST 16  ALT 13  ALKPHOS 70  BILITOT 1.3*   Lipids No results for input(s): "CHOL", "TRIG", "HDL", "LABVLDL", "LDLCALC", "CHOLHDL" in the last 168 hours.  Hematology Recent Labs  Lab 11/09/22 1618 11/10/22 0151  WBC 4.6 5.3  RBC 4.52 3.70*  HGB 12.6 10.3*  HCT 39.5 32.4*  MCV 87.4 87.6  MCH 27.9 27.8  MCHC 31.9 31.8  RDW 16.1* 15.9*  PLT 302 257   Thyroid  Recent Labs  Lab 11/09/22 1908  TSH 0.521    BNPNo results for input(s): "BNP", "PROBNP" in the last 168 hours.  DDimer No results for input(s): "DDIMER" in the last 168 hours.   Radiology/Studies:  CT Angio Chest PE W and/or Wo Contrast  Result Date: 11/09/2022 CLINICAL DATA:  Fatigue, high probability of pulmonary embolism. EXAM: CT  ANGIOGRAPHY CHEST WITH CONTRAST TECHNIQUE: Multidetector CT imaging of the chest was performed using the standard protocol during bolus administration of intravenous contrast. Multiplanar CT image reconstructions and MIPs were obtained to evaluate the vascular anatomy. RADIATION DOSE REDUCTION: This exam was performed according to the departmental dose-optimization program which includes automated exposure control, adjustment of the mA and/or kV according to patient size and/or use of iterative reconstruction technique. CONTRAST:  75mL OMNIPAQUE IOHEXOL 350 MG/ML SOLN COMPARISON:  August 04, 2021. FINDINGS: Cardiovascular: Satisfactory opacification of the pulmonary arteries to the segmental level. No evidence of pulmonary embolism. Normal heart size. No pericardial effusion. Mediastinum/Nodes: No enlarged mediastinal, hilar, or axillary lymph nodes. Thyroid gland, trachea, and esophagus demonstrate no significant findings. Lungs/Pleura: Lungs are clear. No pleural effusion or pneumothorax. Upper Abdomen: No acute abnormality. Musculoskeletal: No chest wall abnormality. No acute or significant osseous findings. Review of the MIP images confirms the above findings. IMPRESSION: No definite evidence of pulmonary embolus. No acute abnormality seen in the chest. Electronically Signed   By: Lupita Raider M.D.   On: 11/09/2022 18:02     Assessment and Plan:   Bradyrhythmia  Prolonged PR Patient presenting with vague constellation of symptoms that appear somewhat vagal.  Her primary complaints are exertional fatigue worsened throughout the day and episodic hot flashes that accompanied with shortness of breath and palpitations. EKG showing sinus bradycardia with 1st degree block. PR 205. Similar to prior readings. On telemetry she shows sinus arrhythmia, which normally does not present symptomatically. No clear etiology of this or her symptoms.  Will discuss case with EP to see if they have any recommendations.  Will likely need live heart monitor.  Echo pending, not likely this will be too beneficial.  Will ask nurse to walk patient and see how HR does with activity TSH normal   Shortness of breath, fatigue, hot flashes Unclear  if her symptoms could be attributed to her anemia or underlying bradycardia. CTA negative for PE with DVT studies still pending. No evidence of any infectious causes or other identifiable causes.   Menometrorrhagia Anemia  Patient with new occurrences of heavier and irregular cycles.  Could consider obtaining iron study due to anemia and history of needing iron transfusions during pregnancy.  History of DVT during pregnancy 2018 and recent unprovoked PE 2023 Previously on Eliquis however did not follow-up with heme-onc.  Would recommend follow-up now and be placed back on Eliquis. CTA neg, DVT studies pending    Risk Assessment/Risk Scores:   For questions or updates, please contact [facility] Please consult [email or web address] for contact info under    Signed, Abagail Kitchens, PA-C  11/10/2022 7:46 AM

## 2022-11-10 NOTE — Progress Notes (Signed)
Renal artery duplex study completed.   Please see CV Proc for preliminary results.   Leilah Polimeni, RDMS, RVT  

## 2022-11-10 NOTE — Progress Notes (Addendum)
[facility] PROGRESS NOTE   Tanya Fisher QIH:[id number] DOB: [birth year] DOA: 11/09/2022  PCP: Patient, No Pcp Per  Brief History/Interval Summary: 27 y.o. female with medical history significant for DVT during pregnancy in 2018 treated with Lovenox and subsequently discontinued after pregnancy, presented on 08/04/2021 with unprovoked PE received heparin drip and transitioned to Eliquis.  Was discharged on Eliquis and advised to follow-up with hematology oncology outpatient.  She completed 2 months of Eliquis and did not follow up with hematology oncology.  The patient is currently not on blood thinners and is not on any prescribed medications.  Presented with near syncopal episodes and palpitations. In the ED, she was noted to be profoundly bradycardic with heart rate in the 30s to 40s. She had a CT angio chest that was negative for pulmonary embolism.  TSH was normal, troponins were normal.  EKG revealed sinus bradycardia with PACs, rate of 42 and QTc of 398.  Patient was hospitalized for further management.  Consultants: Cardiology  Procedures: Echocardiogram is pending    Subjective/Interval History: Patient denies any chest pain or shortness of breath this morning.  No dizziness or lightheadedness at this time.     Assessment/Plan:  Near syncope with palpitations Noted to have bradycardia on EKG.  Suspicion for symptomatic bradycardia.  TSH noted to be normal.  Echocardiogram is pending.  Monitor electrolytes. Cardiology has been consulted and we await their input.  History of recurrent Venous thromboembolism Had a DVT in 2018 and pulmonary embolism in 2023.  Not on anticoagulation at this time.  CT angiogram was negative for PE.  Await lower extremity Doppler study. She did not follow-up with hematology after previous admission in 2023. Will send ambulatory referral to hematology/oncology. Need for anticoagulation to be determined based on further studies to be done  during this admission.  Elevated blood pressure No documented history of essential hypertension.  Blood pressure is noted to be elevated at times. Continue to monitor for now.  May need definitive treatment but will first wait for cardiology input.  Normocytic anemia Drop in hemoglobin is likely dilutional.  No evidence for overt bleeding.  Initial hemoglobin might have hemoconcentrated since she seems to be currently at her baseline.  DVT Prophylaxis: Lovenox Code Status: Full code Family Communication: Discussed with patient Disposition Plan: To be determined.  Hopefully return home when workup has been completed and if cleared by cardiology.  Status is: Observation The patient remains OBS appropriate and may d/c before 2 midnights.      Medications: Scheduled:  enoxaparin (LOVENOX) injection  40 mg Subcutaneous Q24H   Continuous:  lactated ringers 50 mL/hr at 11/09/22 2304   OVF:IEPPIRJJOACZY, hydrALAZINE, polyethylene glycol, prochlorperazine  Antibiotics: Anti-infectives (From admission, onward)    None       Objective:  Vital Signs  Vitals:   11/09/22 2000 11/09/22 2151 11/10/22 0447 11/10/22 0802  BP: (!) 142/94 (!) 149/102 117/83 (!) 157/109  Pulse: (!) 41 (!) 47 62 (!) 46  Resp: 14  16 20   Temp: 98 F (36.7 C) 97.8 F (36.6 C) 98.6 F (37 C) 97.9 F (36.6 C)  TempSrc:  Oral Oral Oral  SpO2: 100% 100% 100% 100%  Weight:  73.9 kg    Height:  5\' 3"  (1.6 m)      Intake/Output Summary (Last 24 hours) at 11/10/2022 0849 Last data filed at 11/09/2022 2328 Gross per 24 hour  Intake 1259.21 ml  Output --  Net 1259.21 ml   [employer]  11/09/22 1602 11/09/22 2151  Weight: 74.4 kg 73.9 kg    General appearance: Awake alert.  In no distress Resp: Clear to auscultation bilaterally.  Normal effort Cardio: S1-S2 is normal regular.  No S3-S4.  No rubs murmurs or bruit GI: Abdomen is soft.  Nontender nondistended.  Bowel sounds are present normal.  No  masses organomegaly Extremities: No edema.  Full range of motion of lower extremities. Neurologic: Alert and oriented x3.  No focal neurological deficits.    Lab Results:  Data Reviewed: I have personally reviewed following labs and reports of the imaging studies  CBC: Recent Labs  Lab 11/09/22 1618 11/10/22 0151  WBC 4.6 5.3  HGB 12.6 10.3*  HCT 39.5 32.4*  MCV 87.4 87.6  PLT 302 257    Basic Metabolic Panel: Recent Labs  Lab 11/09/22 1618 11/10/22 0151  NA 139 137  K 3.5 3.7  CL 105 109  CO2 23 23  GLUCOSE 88 114*  BUN 6 6  CREATININE 0.87 0.92  CALCIUM 9.9 8.5*  MG  --  2.0  PHOS  --  2.8    GFR: Estimated Creatinine Clearance: 87.7 mL/min (by C-G formula based on SCr of 0.92 mg/dL).  Liver Function Tests: Recent Labs  Lab 11/09/22 1618  AST 16  ALT 13  ALKPHOS 70  BILITOT 1.3*  PROT 8.3*  ALBUMIN 4.4    Recent Labs  Lab 11/09/22 1618  LIPASE <10*    CBG: Recent Labs  Lab 11/09/22 1608  GLUCAP 102*    Thyroid Function Tests: Recent Labs    11/09/22 1908  TSH 0.521     Radiology Studies: CT Angio Chest PE W and/or Wo Contrast  Result Date: 11/09/2022 CLINICAL DATA:  Fatigue, high probability of pulmonary embolism. EXAM: CT ANGIOGRAPHY CHEST WITH CONTRAST TECHNIQUE: Multidetector CT imaging of the chest was performed using the standard protocol during bolus administration of intravenous contrast. Multiplanar CT image reconstructions and MIPs were obtained to evaluate the vascular anatomy. RADIATION DOSE REDUCTION: This exam was performed according to the departmental dose-optimization program which includes automated exposure control, adjustment of the mA and/or kV according to patient size and/or use of iterative reconstruction technique. CONTRAST:  75mL OMNIPAQUE IOHEXOL 350 MG/ML SOLN COMPARISON:  August 04, 2021. FINDINGS: Cardiovascular: Satisfactory opacification of the pulmonary arteries to the segmental level. No evidence of  pulmonary embolism. Normal heart size. No pericardial effusion. Mediastinum/Nodes: No enlarged mediastinal, hilar, or axillary lymph nodes. Thyroid gland, trachea, and esophagus demonstrate no significant findings. Lungs/Pleura: Lungs are clear. No pleural effusion or pneumothorax. Upper Abdomen: No acute abnormality. Musculoskeletal: No chest wall abnormality. No acute or significant osseous findings. Review of the MIP images confirms the above findings. IMPRESSION: No definite evidence of pulmonary embolus. No acute abnormality seen in the chest. Electronically Signed   By: Lupita Raider M.D.   On: 11/09/2022 18:02       LOS: 0 days   Tanya Fisher Rito Ehrlich  [facility] Pager on [email or web address]  11/10/2022, 8:49 AM

## 2022-11-10 NOTE — Progress Notes (Signed)
Echocardiogram 2D Echocardiogram has been performed.  Tanya Fisher 11/10/2022, 10:39 AM

## 2022-11-10 NOTE — Plan of Care (Signed)

## 2022-11-11 ENCOUNTER — Other Ambulatory Visit (HOSPITAL_COMMUNITY): Payer: Self-pay

## 2022-11-11 ENCOUNTER — Other Ambulatory Visit: Payer: Self-pay | Admitting: Cardiology

## 2022-11-11 ENCOUNTER — Observation Stay: Payer: Medicaid Other

## 2022-11-11 DIAGNOSIS — R001 Bradycardia, unspecified: Secondary | ICD-10-CM

## 2022-11-11 DIAGNOSIS — Z86718 Personal history of other venous thrombosis and embolism: Secondary | ICD-10-CM | POA: Diagnosis not present

## 2022-11-11 DIAGNOSIS — Z87891 Personal history of nicotine dependence: Secondary | ICD-10-CM | POA: Diagnosis not present

## 2022-11-11 DIAGNOSIS — R55 Syncope and collapse: Secondary | ICD-10-CM | POA: Diagnosis not present

## 2022-11-11 DIAGNOSIS — J45909 Unspecified asthma, uncomplicated: Secondary | ICD-10-CM | POA: Diagnosis not present

## 2022-11-11 DIAGNOSIS — I10 Essential (primary) hypertension: Secondary | ICD-10-CM | POA: Diagnosis not present

## 2022-11-11 LAB — RETICULOCYTES
Immature Retic Fract: 13.7 % (ref 2.3–15.9)
RBC.: 3.99 MIL/uL (ref 3.87–5.11)
Retic Count, Absolute: 69.8 10*3/uL (ref 19.0–186.0)
Retic Ct Pct: 1.8 % (ref 0.4–3.1)

## 2022-11-11 LAB — CBC
HCT: 34.9 % — ABNORMAL LOW (ref 36.0–46.0)
Hemoglobin: 10.9 g/dL — ABNORMAL LOW (ref 12.0–15.0)
MCH: 27.5 pg (ref 26.0–34.0)
MCHC: 31.2 g/dL (ref 30.0–36.0)
MCV: 87.9 fL (ref 80.0–100.0)
Platelets: 276 10*3/uL (ref 150–400)
RBC: 3.97 MIL/uL (ref 3.87–5.11)
RDW: 15.7 % — ABNORMAL HIGH (ref 11.5–15.5)
WBC: 5 10*3/uL (ref 4.0–10.5)
nRBC: 0 % (ref 0.0–0.2)

## 2022-11-11 LAB — BASIC METABOLIC PANEL
Anion gap: 8 (ref 5–15)
BUN: <5 mg/dL — ABNORMAL LOW (ref 6–20)
CO2: 25 mmol/L (ref 22–32)
Calcium: 9.2 mg/dL (ref 8.9–10.3)
Chloride: 105 mmol/L (ref 98–111)
Creatinine, Ser: 0.84 mg/dL (ref 0.44–1.00)
GFR, Estimated: >60 mL/min (ref >60)
Glucose, Bld: 92 mg/dL (ref 70–99)
Potassium: 3.4 mmol/L — ABNORMAL LOW (ref 3.5–5.1)
Sodium: 138 mmol/L (ref 135–145)

## 2022-11-11 LAB — IRON AND TIBC
Iron: 27 ug/dL — ABNORMAL LOW (ref 28–170)
Saturation Ratios: 6 % — ABNORMAL LOW (ref 10.4–31.8)
TIBC: 454 ug/dL — ABNORMAL HIGH (ref 250–450)
UIBC: 427 ug/dL

## 2022-11-11 LAB — FERRITIN: Ferritin: 4 ng/mL — ABNORMAL LOW (ref 11–307)

## 2022-11-11 LAB — TROPONIN I (HIGH SENSITIVITY)
Troponin I (High Sensitivity): 4 ng/L (ref <18)
Troponin I (High Sensitivity): 4 ng/L (ref <18)

## 2022-11-11 LAB — VITAMIN B12: Vitamin B-12: 235 pg/mL (ref 180–914)

## 2022-11-11 LAB — MAGNESIUM: Magnesium: 1.8 mg/dL (ref 1.7–2.4)

## 2022-11-11 LAB — FOLATE: Folate: 5.9 ng/mL — ABNORMAL LOW (ref >5.9)

## 2022-11-11 MED ORDER — VITAMIN B-12 1000 MCG PO TABS
1000.0000 ug | ORAL_TABLET | Freq: Every day | ORAL | Status: DC
  Administered 2022-11-11: 1000 ug via ORAL
  Filled 2022-11-11: qty 1

## 2022-11-11 MED ORDER — SODIUM CHLORIDE 0.9 % IV SOLN
250.0000 mg | Freq: Every day | INTRAVENOUS | Status: DC
  Administered 2022-11-11: 250 mg via INTRAVENOUS
  Filled 2022-11-11: qty 20

## 2022-11-11 MED ORDER — POTASSIUM CHLORIDE CRYS ER 20 MEQ PO TBCR
40.0000 meq | EXTENDED_RELEASE_TABLET | Freq: Once | ORAL | Status: AC
  Administered 2022-11-11: 40 meq via ORAL
  Filled 2022-11-11: qty 2

## 2022-11-11 MED ORDER — MAGNESIUM SULFATE 2 GM/50ML IV SOLN
2.0000 g | Freq: Once | INTRAVENOUS | Status: AC
  Administered 2022-11-11: 2 g via INTRAVENOUS
  Filled 2022-11-11: qty 50

## 2022-11-11 MED ORDER — AMLODIPINE BESYLATE 5 MG PO TABS
5.0000 mg | ORAL_TABLET | Freq: Every day | ORAL | 2 refills | Status: DC
  Filled 2022-11-11 – 2023-03-12 (×3): qty 30, 30d supply, fill #0

## 2022-11-11 MED ORDER — FOLIC ACID 1 MG PO TABS
1.0000 mg | ORAL_TABLET | Freq: Every day | ORAL | 2 refills | Status: DC
  Filled 2022-11-11 – 2023-01-19 (×2): qty 30, 30d supply, fill #0

## 2022-11-11 MED ORDER — FOLIC ACID 1 MG PO TABS
1.0000 mg | ORAL_TABLET | Freq: Every day | ORAL | Status: DC
  Administered 2022-11-11: 1 mg via ORAL
  Filled 2022-11-11: qty 1

## 2022-11-11 MED ORDER — SODIUM CHLORIDE 0.9 % IV SOLN: INTRAVENOUS | Status: DC | PRN

## 2022-11-11 MED ORDER — CYANOCOBALAMIN 1000 MCG PO TABS
1000.0000 ug | ORAL_TABLET | Freq: Every day | ORAL | 2 refills | Status: DC
  Filled 2022-11-11 – 2023-01-19 (×2): qty 30, 30d supply, fill #0

## 2022-11-11 MED ORDER — AMLODIPINE BESYLATE 5 MG PO TABS: 5.0000 mg | ORAL_TABLET | Freq: Every day | ORAL | Status: DC

## 2022-11-11 MED ORDER — HYDRALAZINE HCL 25 MG PO TABS
25.0000 mg | ORAL_TABLET | Freq: Three times a day (TID) | ORAL | Status: DC
  Administered 2022-11-11: 25 mg via ORAL
  Filled 2022-11-11: qty 1

## 2022-11-11 MED ORDER — FERROUS SULFATE 325 (65 FE) MG PO TABS
325.0000 mg | ORAL_TABLET | Freq: Two times a day (BID) | ORAL | 3 refills | Status: AC
  Filled 2022-11-11 – 2023-03-12 (×3): qty 60, 30d supply, fill #0

## 2022-11-11 MED ORDER — FERROUS SULFATE 325 (65 FE) MG PO TABS: 325.0000 mg | ORAL_TABLET | Freq: Two times a day (BID) | ORAL | Status: DC

## 2022-11-11 NOTE — Discharge Summary (Signed)
[facility]  Physician Discharge Summary   Patient ID: Tanya Fisher MRN: [id number] DOB/AGE: Nov 29, 1995 27 y.o.  Admit date: 11/09/2022 Discharge date: 11/11/2022    PCP: Patient, No Pcp Per  DISCHARGE DIAGNOSES:    Near syncope   Symptomatic bradycardia   Essential hypertension   RECOMMENDATIONS FOR OUTPATIENT FOLLOW UP: Zio patch to be arranged by cardiology. Ambulatory referral sent to hematology for history of recurrent thromboembolism   Home Health: None Equipment/Devices: None  CODE STATUS: Full code  DISCHARGE CONDITION: fair  Diet recommendation: Heart healthy  INITIAL HISTORY: 27 y.o. female with medical history significant for DVT during pregnancy in 2018 treated with Lovenox and subsequently discontinued after pregnancy, presented on 08/04/2021 with unprovoked PE received heparin drip and transitioned to Eliquis.  Was discharged on Eliquis and advised to follow-up with hematology oncology outpatient.  She completed 2 months of Eliquis and did not follow up with hematology oncology.  The patient is currently not on blood thinners and is not on any prescribed medications.  Presented with near syncopal episodes and palpitations. In the ED, she was noted to be profoundly bradycardic with heart rate in the 30s to 40s. She had a CT angio chest that was negative for pulmonary embolism.  TSH was normal, troponins were normal.  EKG revealed sinus bradycardia with PACs, rate of 42 and QTc of 398.  Patient was hospitalized for further management.   Consultants: Cardiology   Procedures: Echocardiogram   HOSPITAL COURSE:   Near syncope with palpitations/bradycardia Noted to have bradycardia on EKG.  Suspicion for symptomatic bradycardia.  TSH noted to be normal.  Echocardiogram shows normal systolic function. Cardiology to arrange Zio patch for monitoring in the outpatient setting.   History of recurrent Venous thromboembolism Had a DVT in 2018 and pulmonary  embolism in 2023.  Not on anticoagulation at this time.  CT angiogram was negative for PE.  Lower extremity Doppler studies have been negative for DVT. She did not follow-up with hematology after previous admission in 2023. No PE or DVT identified during this admission.  Patient with history of menorrhagia which increases risk of bleeding if placed on anticoagulation.  Since there is no active clot at this time no clear indication to initiate anticoagulation.  She will benefit from being seen by hematology in the outpatient setting considering her multiple episodes of venous thromboembolism. Will send ambulatory referral to hematology/oncology.   Elevated blood pressure No documented history of essential hypertension.  Blood pressure is noted to be elevated at times. Started on hydralazine by cardiology.  Continue to monitor. Episode of chest pain overnight noted.  Troponin levels normal.  EKG did not show any ischemic changes.  Elevated blood pressure thought to be responsible for her symptoms.   Echocardiogram shows normal systolic function without any wall motion abnormalities.   No renal artery stenosis noted on vascular study.   Normocytic anemia deficiency/folic acid deficiency Drop in hemoglobin is likely dilutional.  No evidence for overt bleeding.  Initial hemoglobin might have hemoconcentrated since she seems to be currently at her baseline. Patient does report a history of menorrhagia which is likely responsible for her iron deficiency.  Agreeable to iron infusion.  Will benefit from iron supplementation at discharge.  Will also initiate folic acid supplementation along with B12 supplementation.  Menorrhagia Patient should follow-up with GYN in the outpatient setting.   Patient is stable.  Okay for discharge home today.  PERTINENT LABS:  The results of significant diagnostics from this hospitalization (  including imaging, microbiology, ancillary and laboratory) are listed below for  reference.     Labs:   Basic Metabolic Panel: Recent Labs  Lab 11/09/22 1618 11/10/22 0151 11/11/22 0414  NA 139 137 138  K 3.5 3.7 3.4*  CL 105 109 105  CO2 23 23 25   GLUCOSE 88 114* 92  BUN 6 6 <5*  CREATININE 0.87 0.92 0.84  CALCIUM 9.9 8.5* 9.2  MG  --  2.0 1.8  PHOS  --  2.8  --    Liver Function Tests: Recent Labs  Lab 11/09/22 1618  AST 16  ALT 13  ALKPHOS 70  BILITOT 1.3*  PROT 8.3*  ALBUMIN 4.4   Recent Labs  Lab 11/09/22 1618  LIPASE <10*    CBC: Recent Labs  Lab 11/09/22 1618 11/10/22 0151 11/11/22 0414  WBC 4.6 5.3 5.0  HGB 12.6 10.3* 10.9*  HCT 39.5 32.4* 34.9*  MCV 87.4 87.6 87.9  PLT 302 257 276     CBG: Recent Labs  Lab 11/09/22 1608  GLUCAP 102*     IMAGING STUDIES VAS US RENAL ARTERY DUPLEX  Result Date: 11/10/2022 ABDOMINAL VISCERAL Patient Name:  Tanya Fisher  Date of Exam:   11/10/2022 Medical Rec #: [id number]         Accession #:    [id number] Date of Birth: 09-21-1995          Patient Gender: F Patient Age:   26 years Exam Location:  [facility] Procedure:      VAS US RENAL ARTERY DUPLEX Referring Phys: [id number] TIFFANY Parsons -------------------------------------------------------------------------------- Indications: Hypertension Limitations: Respiratory variation - limited ability to withhold respirations due to clinical status, bradycardia. Comparison Study: No prior studies. Performing Technologist: Jean Rosenthal RDMS, RVT  Examination Guidelines: A complete evaluation includes B-mode imaging, spectral Doppler, color Doppler, and power Doppler as needed of all accessible portions of each vessel. Bilateral testing is considered an integral part of a complete examination. Limited examinations for reoccurring indications may be performed as noted.  Duplex Findings: +----------------------+--------+--------+------+--------+ Mesenteric            PSV cm/sEDV cm/sPlaqueComments  +----------------------+--------+--------+------+--------+ Aorta Mid                93                          +----------------------+--------+--------+------+--------+ Celiac Artery Proximal  137                          +----------------------+--------+--------+------+--------+ SMA Proximal            146                          +----------------------+--------+--------+------+--------+    +------------------+--------+--------+-------+ Right Renal ArteryPSV cm/sEDV cm/sComment +------------------+--------+--------+-------+ Origin               93      35           +------------------+--------+--------+-------+ Proximal             72      10           +------------------+--------+--------+-------+ Mid                 108      37           +------------------+--------+--------+-------+ Distal  123      42           +------------------+--------+--------+-------+ +-----------------+--------+--------+-------+ Left Renal ArteryPSV cm/sEDV cm/sComment +-----------------+--------+--------+-------+ Origin             108      26           +-----------------+--------+--------+-------+ Proximal           102      29           +-----------------+--------+--------+-------+ Mid                105      42           +-----------------+--------+--------+-------+ Distal              89      41           +-----------------+--------+--------+-------+ +------------+--------+--------+----+-----------+--------+--------+----+ Right KidneyPSV cm/sEDV cm/sRI  Left KidneyPSV cm/sEDV cm/sRI   +------------+--------+--------+----+-----------+--------+--------+----+ Upper Pole  46      18      0.61Upper Pole 34      12      0.65 +------------+--------+--------+----+-----------+--------+--------+----+ Mid         29      10      0.        50      14      0.69 +------------+--------+--------+----+-----------+--------+--------+----+  Lower Pole  28      12      0.57Lower Pole 24      8       0.66 +------------+--------+--------+----+-----------+--------+--------+----+ Hilar       59      28      0.53Hilar      52      15      0.72 +------------+--------+--------+----+-----------+--------+--------+----+ +------------------+-----+------------------+----+ Right Kidney           Left Kidney            +------------------+-----+------------------+----+ RAR                    RAR                    +------------------+-----+------------------+----+ RAR (manual)      1.3  RAR (manual)      1.2  +------------------+-----+------------------+----+ Cortex                 Cortex                 +------------------+-----+------------------+----+ Cortex thickness       Corex thickness        +------------------+-----+------------------+----+ Kidney length (cm)10.14Kidney length (cm)9.88 +------------------+-----+------------------+----+  Summary: Renal:  Right: No evidence of right renal artery stenosis. Normal right        Resisitive Index. Normal size right kidney. RRV flow present. Left:  No evidence of left renal artery stenosis. Normal left        Resistive Index. Normal size of left kidney. LRV flow        present. Mesenteric: Normal Celiac artery and Superior Mesenteric artery findings.  *See table(s) above for measurements and observations.  Diagnosing physician: Lemar Livings MD  Electronically signed by Lemar Livings MD on 11/10/2022 at 4:46:21 PM.    Final    ECHOCARDIOGRAM COMPLETE  Result Date: 11/10/2022    ECHOCARDIOGRAM REPORT   Patient Name:   Gerhard Munch Date of Exam: 11/10/2022 Medical Rec #:  [id number]        Height:  63.0 in Accession #:    [id number]       Weight:       162.9 lb Date of Birth:  1996/02/14         BSA:          1.772 m Patient Age:    27 years         BP:           117/83 mmHg Patient Gender: F                HR:           50 bpm. Exam Location:  Inpatient Procedure:  2D Echo, Cardiac Doppler and Color Doppler Indications:    Abnormal ECG  History:        Patient has prior history of Echocardiogram examinations, most                 recent 08/05/2021. Pulmonary Embolism, DVT,                 Signs/Symptoms:Syncope; Risk Factors:Former Smoker.  Sonographer:    Raeford Razor Sonographer#2:  Milbert Coulter Referring Phys: Darlin Drop IMPRESSIONS  1. Left ventricular ejection fraction, by estimation, is 60 to 65%. The left ventricle has normal function. The left ventricle has no regional wall motion abnormalities. Left ventricular diastolic parameters were normal.  2. Right ventricular systolic function is normal. The right ventricular size is normal. There is normal pulmonary artery systolic pressure.  3. The mitral valve is normal in structure. Trivial mitral valve regurgitation. No evidence of mitral stenosis.  4. The aortic valve is normal in structure. Aortic valve regurgitation is not visualized. No aortic stenosis is present.  5. The inferior vena cava is normal in size with greater than 50% respiratory variability, suggesting right atrial pressure of 3 mmHg. FINDINGS  Left Ventricle: Left ventricular ejection fraction, by estimation, is 60 to 65%. The left ventricle has normal function. The left ventricle has no regional wall motion abnormalities. The left ventricular internal cavity size was normal in size. There is  no left ventricular hypertrophy. Left ventricular diastolic parameters were normal. Right Ventricle: The right ventricular size is normal. No increase in right ventricular wall thickness. Right ventricular systolic function is normal. There is normal pulmonary artery systolic pressure. The tricuspid regurgitant velocity is 1.73 m/s, and  with an assumed right atrial pressure of 3 mmHg, the estimated right ventricular systolic pressure is 15.0 mmHg. Left Atrium: Left atrial size was normal in size. Right Atrium: Right atrial size was normal in size. Pericardium: There  is no evidence of pericardial effusion. Mitral Valve: The mitral valve is normal in structure. Trivial mitral valve regurgitation. No evidence of mitral valve stenosis. Tricuspid Valve: The tricuspid valve is normal in structure. Tricuspid valve regurgitation is mild . No evidence of tricuspid stenosis. Aortic Valve: The aortic valve is normal in structure. Aortic valve regurgitation is not visualized. No aortic stenosis is present. Aortic valve mean gradient measures 3.0 mmHg. Aortic valve peak gradient measures 6.1 mmHg. Aortic valve area, by VTI measures 2.60 cm. Pulmonic Valve: The pulmonic valve was normal in structure. Pulmonic valve regurgitation is trivial. No evidence of pulmonic stenosis. Aorta: The aortic root is normal in size and structure. Venous: The inferior vena cava is normal in size with greater than 50% respiratory variability, suggesting right atrial pressure of 3 mmHg. IAS/Shunts: No atrial level shunt detected by color flow Doppler.  LEFT VENTRICLE PLAX 2D LVIDd:  4.00 cm      Diastology LVIDs:         2.70 cm      LV e' medial:    14.60 cm/s LV PW:         1.00 cm      LV E/e' medial:  6.3 LV IVS:        1.00 cm      LV e' lateral:   19.60 cm/s LVOT diam:     2.00 cm      LV E/e' lateral: 4.7 LV SV:         78 LV SV Index:   44 LVOT Area:     3.14 cm  LV Volumes (MOD) LV vol d, MOD A4C: 109.0 ml LV vol s, MOD A4C: 41.3 ml LV SV MOD A4C:     109.0 ml RIGHT VENTRICLE            IVC RV Basal diam:  2.80 cm    IVC diam: 2.00 cm RV S prime:     9.57 cm/s TAPSE (M-mode): 1.8 cm LEFT ATRIUM             Index        RIGHT ATRIUM           Index LA diam:        3.30 cm 1.86 cm/m   RA Area:     11.10 cm LA Vol (A2C):   21.4 ml 12.08 ml/m  RA Volume:   26.90 ml  15.18 ml/m LA Vol (A4C):   40.0 ml 22.57 ml/m LA Biplane Vol: 31.1 ml 17.55 ml/m  AORTIC VALVE AV Area (Vmax):    2.63 cm AV Area (Vmean):   2.51 cm AV Area (VTI):     2.60 cm AV Vmax:           123.00 cm/s AV Vmean:           80.100 cm/s AV VTI:            0.298 m AV Peak Grad:      6.1 mmHg AV Mean Grad:      3.0 mmHg LVOT Vmax:         103.00 cm/s LVOT Vmean:        63.900 cm/s LVOT VTI:          0.247 m LVOT/AV VTI ratio: 0.83  AORTA Ao Root diam: 2.60 cm Ao Asc diam:  3.10 cm MITRAL VALVE               TRICUSPID VALVE MV Area (PHT): 3.68 cm    TR Peak grad:   12.0 mmHg MV Decel Time: 206 msec    TR Vmax:        173.00 cm/s MR Peak grad: 20.1 mmHg MR Vmax:      224.00 cm/s  SHUNTS MV E velocity: 92.70 cm/s  Systemic VTI:  0.25 m MV A velocity: 38.10 cm/s  Systemic Diam: 2.00 cm MV E/A ratio:  2.43 Arvilla Meres MD Electronically signed by Arvilla Meres MD Signature Date/Time: 11/10/2022/11:56:09 AM    Final    VAS [place] LOWER EXTREMITY VENOUS (DVT)  Result Date: 11/10/2022  Lower Venous DVT Study Patient Name:  DEVONTE SHOPE  Date of Exam:   11/10/2022 Medical Rec #: [id number]         Accession #:    [id number] Date of Birth: 18-Apr-1996          Patient Gender: F Patient  Age:   89 years Exam Location:  [facility] Procedure:      VAS [place] LOWER EXTREMITY VENOUS (DVT) Referring Phys: Dow Adolph --------------------------------------------------------------------------------  Indications: History of PE 08-04-2021, negative for PE yesterday.  Comparison Study: 08-05-2021 Prior bilateral lower extremity venous study was                   negative for DVT. Performing Technologist: Jean Rosenthal RDMS, RVT  Examination Guidelines: A complete evaluation includes B-mode imaging, spectral Doppler, color Doppler, and power Doppler as needed of all accessible portions of each vessel. Bilateral testing is considered an integral part of a complete examination. Limited examinations for reoccurring indications may be performed as noted. The reflux portion of the exam is performed with the patient in reverse Trendelenburg.  +---------+---------------+---------+-----------+----------+--------------+ RIGHT     CompressibilityPhasicitySpontaneityPropertiesThrombus Aging +---------+---------------+---------+-----------+----------+--------------+ CFV      Full           Yes      Yes                                 +---------+---------------+---------+-----------+----------+--------------+ SFJ      Full                                                        +---------+---------------+---------+-----------+----------+--------------+ FV Prox  Full                                                        +---------+---------------+---------+-----------+----------+--------------+ FV Mid   Full                                                        +---------+---------------+---------+-----------+----------+--------------+ FV DistalFull                                                        +---------+---------------+---------+-----------+----------+--------------+ PFV      Full                                                        +---------+---------------+---------+-----------+----------+--------------+ POP      Full           Yes      Yes                                 +---------+---------------+---------+-----------+----------+--------------+ PTV      Full                                                        +---------+---------------+---------+-----------+----------+--------------+  PERO     Full                                                        +---------+---------------+---------+-----------+----------+--------------+   +---------+---------------+---------+-----------+----------+--------------+ LEFT     CompressibilityPhasicitySpontaneityPropertiesThrombus Aging +---------+---------------+---------+-----------+----------+--------------+ CFV      Full           Yes      Yes                                 +---------+---------------+---------+-----------+----------+--------------+ SFJ      Full                                                         +---------+---------------+---------+-----------+----------+--------------+ FV Prox  Full                                                        +---------+---------------+---------+-----------+----------+--------------+ FV Mid   Full                                                        +---------+---------------+---------+-----------+----------+--------------+ FV DistalFull                                                        +---------+---------------+---------+-----------+----------+--------------+ PFV      Full                                                        +---------+---------------+---------+-----------+----------+--------------+ POP      Full           Yes      Yes                                 +---------+---------------+---------+-----------+----------+--------------+ PTV      Full                                                        +---------+---------------+---------+-----------+----------+--------------+ PERO     Full                                                        +---------+---------------+---------+-----------+----------+--------------+  Summary: RIGHT: - There is no evidence of deep vein thrombosis in the lower extremity.  - No cystic structure found in the popliteal fossa.  LEFT: - There is no evidence of deep vein thrombosis in the lower extremity.  - No cystic structure found in the popliteal fossa.  *See table(s) above for measurements and observations. Electronically signed by Sherald Hess MD on 11/10/2022 at 10:59:15 AM.    Final    CT Angio Chest PE W and/or Wo Contrast  Result Date: 11/09/2022 CLINICAL DATA:  Fatigue, high probability of pulmonary embolism. EXAM: CT ANGIOGRAPHY CHEST WITH CONTRAST TECHNIQUE: Multidetector CT imaging of the chest was performed using the standard protocol during bolus administration of intravenous contrast. Multiplanar CT image reconstructions and MIPs were  obtained to evaluate the vascular anatomy. RADIATION DOSE REDUCTION: This exam was performed according to the departmental dose-optimization program which includes automated exposure control, adjustment of the mA and/or kV according to patient size and/or use of iterative reconstruction technique. CONTRAST:  75mL OMNIPAQUE IOHEXOL 350 MG/ML SOLN COMPARISON:  August 04, 2021. FINDINGS: Cardiovascular: Satisfactory opacification of the pulmonary arteries to the segmental level. No evidence of pulmonary embolism. Normal heart size. No pericardial effusion. Mediastinum/Nodes: No enlarged mediastinal, hilar, or axillary lymph nodes. Thyroid gland, trachea, and esophagus demonstrate no significant findings. Lungs/Pleura: Lungs are clear. No pleural effusion or pneumothorax. Upper Abdomen: No acute abnormality. Musculoskeletal: No chest wall abnormality. No acute or significant osseous findings. Review of the MIP images confirms the above findings. IMPRESSION: No definite evidence of pulmonary embolus. No acute abnormality seen in the chest. Electronically Signed   By: Lupita Raider M.D.   On: 11/09/2022 18:02    DISCHARGE EXAMINATION: See progress note from earlier today  DISPOSITION: Home  Discharge Instructions     Ambulatory referral to Hematology / Oncology   Complete by: As directed    Need to be seen by hematology for recurrent h/o DVT/PE. thanks   Call MD for:  difficulty breathing, headache or visual disturbances   Complete by: As directed    Call MD for:  extreme fatigue   Complete by: As directed    Call MD for:  persistant dizziness or light-headedness   Complete by: As directed    Call MD for:  persistant nausea and vomiting   Complete by: As directed    Call MD for:  severe uncontrolled pain   Complete by: As directed    Call MD for:  temperature >100.4   Complete by: As directed    Diet - low sodium heart healthy   Complete by: As directed    Discharge instructions   Complete by:  As directed    Please take your medication as prescribed.  Referral has been sent to hematology for further evaluation of your blood clot episodes.  The cardiology team will arrange outpatient follow-up.  You were cared for by a hospitalist during your hospital stay. If you have any questions about your discharge medications or the care you received while you were in the hospital after you are discharged, you can call the unit and asked to speak with the hospitalist on call if the hospitalist that took care of you is not available. Once you are discharged, your primary care physician will handle any further medical issues. Please note that NO REFILLS for any discharge medications will be authorized once you are discharged, as it is imperative that you return to your primary care physician (or establish a relationship with a  primary care physician if you do not have one) for your aftercare needs so that they can reassess your need for medications and monitor your lab values. If you do not have a primary care physician, you can call [phone] for a physician referral.   Increase activity slowly   Complete by: As directed           Allergies as of 11/11/2022   No Known Allergies      Medication List     STOP taking these medications    azithromycin 250 MG tablet Commonly known as: ZITHROMAX   Eliquis DVT/PE Starter Pack Generic drug: Apixaban Starter Pack (10mg  and 5mg )   metroNIDAZOLE 500 MG tablet Commonly known as: FLAGYL       TAKE these medications    amLODipine 5 MG tablet Commonly known as: NORVASC Take 1 tablet (5 mg total) by mouth daily.   cyanocobalamin 1000 MCG tablet Commonly known as: VITAMIN B12 Take 1 tablet (1,000 mcg total) by mouth daily.   FeroSul 325 (65 FE) MG tablet Generic drug: ferrous sulfate Take 1 tablet (325 mg total) by mouth 2 (two) times daily with a meal.   folic acid 1 MG tablet Commonly known as: FOLVITE Take 1 tablet (1 mg total) by  mouth daily.          Follow-up Information     [facility] Follow up on 11/25/2022.   Why: @ 9:00 am for hospital follow up appointment with [facility] If you cannot make this scheduled appointment; please call the office to reschedule. Contact information: [address] [place] [address] [phone]                TOTAL DISCHARGE TIME: 35 minutes  Eran Windish Rito Ehrlich  [facility] Pager on [email or web address]  11/12/2022, 10:26 AM

## 2022-11-11 NOTE — Addendum Note (Signed)
Addended by: Perlie Gold on: 11/11/2022 12:38 PM   Modules accepted: Orders

## 2022-11-11 NOTE — Progress Notes (Signed)
[facility] PROGRESS NOTE   JAMESINA RATAY ZOX:[id number] DOB: 1996/02/07 DOA: 11/09/2022  PCP: Patient, No Pcp Per  Brief History/Interval Summary: 27 y.o. female with medical history significant for DVT during pregnancy in 2018 treated with Lovenox and subsequently discontinued after pregnancy, presented on 08/04/2021 with unprovoked PE received heparin drip and transitioned to Eliquis.  Was discharged on Eliquis and advised to follow-up with hematology oncology outpatient.  She completed 2 months of Eliquis and did not follow up with hematology oncology.  The patient is currently not on blood thinners and is not on any prescribed medications.  Presented with near syncopal episodes and palpitations. In the ED, she was noted to be profoundly bradycardic with heart rate in the 30s to 40s. She had a CT angio chest that was negative for pulmonary embolism.  TSH was normal, troponins were normal.  EKG revealed sinus bradycardia with PACs, rate of 42 and QTc of 398.  Patient was hospitalized for further management.  Consultants: Cardiology  Procedures: Echocardiogram    Subjective/Interval History: Patient experienced chest pain overnight in the retrosternal area.  Denies any pain this morning.  Does not complain of acid reflux.  No skin rashes.    Assessment/Plan:  Near syncope with palpitations/bradycardia Noted to have bradycardia on EKG.  Suspicion for symptomatic bradycardia.  TSH noted to be normal.  Echocardiogram shows normal systolic function. Cardiology is planning heart monitor.   History of recurrent Venous thromboembolism Had a DVT in 2018 and pulmonary embolism in 2023.  Not on anticoagulation at this time.  CT angiogram was negative for PE.  Lower extremity Doppler studies have been negative for DVT. She did not follow-up with hematology after previous admission in 2023. No PE or DVT identified during this admission.  Patient with history of menorrhagia which increases  risk of bleeding if placed on anticoagulation.  Since there is no active clot at this time no clear indication to initiate anticoagulation.  She will benefit from being seen by hematology in the outpatient setting considering her multiple episodes of venous thromboembolism. Will send ambulatory referral to hematology/oncology.  Elevated blood pressure No documented history of essential hypertension.  Blood pressure is noted to be elevated at times. Started on hydralazine by cardiology.  Continue to monitor. Episode of chest pain overnight noted.  Troponin levels normal.  EKG did not show any ischemic changes.  Elevated blood pressure thought to be responsible for her symptoms.  Await further input from cardiology.   Echocardiogram shows normal systolic function without any wall motion abnormalities.   No renal artery stenosis noted on vascular study.  Normocytic anemia deficiency/folic acid deficiency Drop in hemoglobin is likely dilutional.  No evidence for overt bleeding.  Initial hemoglobin might have hemoconcentrated since she seems to be currently at her baseline. Patient does report a history of menorrhagia which is likely responsible for her iron deficiency.  Agreeable to iron infusion.  Will benefit from iron supplementation at discharge.  Will also initiate folic acid supplementation along with B12 supplementation.  DVT Prophylaxis: Lovenox Code Status: Full code Family Communication: Discussed with patient Disposition Plan: To be determined.  Hopefully return home when workup has been completed and when cleared by cardiology.  Status is: Observation The patient remains OBS appropriate and may d/c before 2 midnights.      Medications: Scheduled:  enoxaparin (LOVENOX) injection  40 mg Subcutaneous Q24H   hydrALAZINE  25 mg Oral Q8H   Continuous:  sodium chloride 10 mL/hr at 11/11/22  1610   RUE:AVWUJW chloride, acetaminophen, hydrALAZINE, polyethylene glycol,  prochlorperazine  Antibiotics: Anti-infectives (From admission, onward)    None       Objective:  Vital Signs  Vitals:   11/10/22 1943 11/11/22 0041 11/11/22 0407 11/11/22 0823  BP: (!) 128/99 (!) 119/90 (!) 152/91 (!) 152/94  Pulse: (!) 43 (!) 43 (!) 56 61  Resp: 18 18 18 17   Temp: 98.5 F (36.9 C) 98.5 F (36.9 C) 97.7 F (36.5 C) 98.6 F (37 C)  TempSrc: Oral Oral Oral Oral  SpO2: 97% 100% 100% 100%  Weight:      Height:        Intake/Output Summary (Last 24 hours) at 11/11/2022 1058 Last data filed at 11/10/2022 1827 Gross per 24 hour  Intake 392.37 ml  Output --  Net 392.37 ml    Filed Weights   11/09/22 1602 11/09/22 2151  Weight: 74.4 kg 73.9 kg    General appearance: Awake alert.  In no distress Resp: Clear to auscultation bilaterally.  Normal effort Cardio: S1-S2 is normal regular.  No S3-S4.  No rubs murmurs or bruit GI: Abdomen is soft.  Nontender nondistended.  Bowel sounds are present normal.  No masses organomegaly Extremities: No edema.  Full range of motion of lower extremities. Neurologic: Alert and oriented x3.  No focal neurological deficits.    Lab Results:  Data Reviewed: I have personally reviewed following labs and reports of the imaging studies  CBC: Recent Labs  Lab 11/09/22 1618 11/10/22 0151 11/11/22 0414  WBC 4.6 5.3 5.0  HGB 12.6 10.3* 10.9*  HCT 39.5 32.4* 34.9*  MCV 87.4 87.6 87.9  PLT 302 257 276     Basic Metabolic Panel: Recent Labs  Lab 11/09/22 1618 11/10/22 0151 11/11/22 0414  NA 139 137 138  K 3.5 3.7 3.4*  CL 105 109 105  CO2 23 23 25   GLUCOSE 88 114* 92  BUN 6 6 <5*  CREATININE 0.87 0.92 0.84  CALCIUM 9.9 8.5* 9.2  MG  --  2.0 1.8  PHOS  --  2.8  --      GFR: Estimated Creatinine Clearance: 96 mL/min (by C-G formula based on SCr of 0.84 mg/dL).  Liver Function Tests: Recent Labs  Lab 11/09/22 1618  AST 16  ALT 13  ALKPHOS 70  BILITOT 1.3*  PROT 8.3*  ALBUMIN 4.4     Recent Labs   Lab 11/09/22 1618  LIPASE <10*     CBG: Recent Labs  Lab 11/09/22 1608  GLUCAP 102*     Thyroid Function Tests: Recent Labs    11/09/22 1908  TSH 0.521      Radiology Studies: VAS US RENAL ARTERY DUPLEX  Result Date: 11/10/2022 ABDOMINAL VISCERAL Patient Name:  KEIRSTIN LEACOCK  Date of Exam:   11/10/2022 Medical Rec #: [id number]         Accession #:    [id number] Date of Birth: [birth year]          Patient Gender: F Patient Age:   29 years Exam Location:  [facility] Procedure:      VAS US RENAL ARTERY DUPLEX Referring Phys: [id number] TIFFANY Porcupine -------------------------------------------------------------------------------- Indications: Hypertension Limitations: Respiratory variation - limited ability to withhold respirations due to clinical status, bradycardia. Comparison Study: No prior studies. Performing Technologist: Jean Rosenthal RDMS, RVT  Examination Guidelines: A complete evaluation includes B-mode imaging, spectral Doppler, color Doppler, and power Doppler as needed of all accessible portions of each vessel. Bilateral testing  is considered an integral part of a complete examination. Limited examinations for reoccurring indications may be performed as noted.  Duplex Findings: +----------------------+--------+--------+------+--------+ Mesenteric            PSV cm/sEDV cm/sPlaqueComments +----------------------+--------+--------+------+--------+ Aorta Mid                93                          +----------------------+--------+--------+------+--------+ Celiac Artery Proximal  137                          +----------------------+--------+--------+------+--------+ SMA Proximal            146                          +----------------------+--------+--------+------+--------+    +------------------+--------+--------+-------+ Right Renal ArteryPSV cm/sEDV cm/sComment +------------------+--------+--------+-------+ Origin               93      35            +------------------+--------+--------+-------+ Proximal             72      10           +------------------+--------+--------+-------+ Mid                 108      37           +------------------+--------+--------+-------+ Distal              123      42           +------------------+--------+--------+-------+ +-----------------+--------+--------+-------+ Left Renal ArteryPSV cm/sEDV cm/sComment +-----------------+--------+--------+-------+ Origin             108      26           +-----------------+--------+--------+-------+ Proximal           102      29           +-----------------+--------+--------+-------+ Mid                105      42           +-----------------+--------+--------+-------+ Distal              89      41           +-----------------+--------+--------+-------+ +------------+--------+--------+----+-----------+--------+--------+----+ Right KidneyPSV cm/sEDV cm/sRI  Left KidneyPSV cm/sEDV cm/sRI   +------------+--------+--------+----+-----------+--------+--------+----+ Upper Pole  46      18      0.61Upper Pole 34      12      0.65 +------------+--------+--------+----+-----------+--------+--------+----+ Mid         29      10      0.        50      14      0.69 +------------+--------+--------+----+-----------+--------+--------+----+ Lower Pole  28      12      0.57Lower Pole 24      8       0.66 +------------+--------+--------+----+-----------+--------+--------+----+ Hilar       59      28      0.53Hilar      52      15      0.72 +------------+--------+--------+----+-----------+--------+--------+----+ +------------------+-----+------------------+----+ Right Kidney  Left Kidney            +------------------+-----+------------------+----+ RAR                    RAR                    +------------------+-----+------------------+----+ RAR (manual)      1.3  RAR (manual)       1.2  +------------------+-----+------------------+----+ Cortex                 Cortex                 +------------------+-----+------------------+----+ Cortex thickness       Corex thickness        +------------------+-----+------------------+----+ Kidney length (cm)10.14Kidney length (cm)9.88 +------------------+-----+------------------+----+  Summary: Renal:  Right: No evidence of right renal artery stenosis. Normal right        Resisitive Index. Normal size right kidney. RRV flow present. Left:  No evidence of left renal artery stenosis. Normal left        Resistive Index. Normal size of left kidney. LRV flow        present. Mesenteric: Normal Celiac artery and Superior Mesenteric artery findings.  *See table(s) above for measurements and observations.  Diagnosing physician: Lemar Livings MD  Electronically signed by Lemar Livings MD on 11/10/2022 at 4:46:21 PM.    Final    ECHOCARDIOGRAM COMPLETE  Result Date: 11/10/2022    ECHOCARDIOGRAM REPORT   Patient Name:   Gerhard Munch Date of Exam: 11/10/2022 Medical Rec #:  [id number]        Height:       63.0 in Accession #:    [id number]       Weight:       162.9 lb Date of Birth:  October 09, 1995         BSA:          1.772 m Patient Age:    27 years         BP:           117/83 mmHg Patient Gender: F                HR:           50 bpm. Exam Location:  Inpatient Procedure: 2D Echo, Cardiac Doppler and Color Doppler Indications:    Abnormal ECG  History:        Patient has prior history of Echocardiogram examinations, most                 recent 08/05/2021. Pulmonary Embolism, DVT,                 Signs/Symptoms:Syncope; Risk Factors:Former Smoker.  Sonographer:    Raeford Razor Sonographer#2:  Milbert Coulter Referring Phys: Darlin Drop IMPRESSIONS  1. Left ventricular ejection fraction, by estimation, is 60 to 65%. The left ventricle has normal function. The left ventricle has no regional wall motion abnormalities. Left ventricular diastolic parameters were  normal.  2. Right ventricular systolic function is normal. The right ventricular size is normal. There is normal pulmonary artery systolic pressure.  3. The mitral valve is normal in structure. Trivial mitral valve regurgitation. No evidence of mitral stenosis.  4. The aortic valve is normal in structure. Aortic valve regurgitation is not visualized. No aortic stenosis is present.  5. The inferior vena cava is normal in size with greater than 50% respiratory variability, suggesting right atrial pressure of 3 mmHg.  FINDINGS  Left Ventricle: Left ventricular ejection fraction, by estimation, is 60 to 65%. The left ventricle has normal function. The left ventricle has no regional wall motion abnormalities. The left ventricular internal cavity size was normal in size. There is  no left ventricular hypertrophy. Left ventricular diastolic parameters were normal. Right Ventricle: The right ventricular size is normal. No increase in right ventricular wall thickness. Right ventricular systolic function is normal. There is normal pulmonary artery systolic pressure. The tricuspid regurgitant velocity is 1.73 m/s, and  with an assumed right atrial pressure of 3 mmHg, the estimated right ventricular systolic pressure is 15.0 mmHg. Left Atrium: Left atrial size was normal in size. Right Atrium: Right atrial size was normal in size. Pericardium: There is no evidence of pericardial effusion. Mitral Valve: The mitral valve is normal in structure. Trivial mitral valve regurgitation. No evidence of mitral valve stenosis. Tricuspid Valve: The tricuspid valve is normal in structure. Tricuspid valve regurgitation is mild . No evidence of tricuspid stenosis. Aortic Valve: The aortic valve is normal in structure. Aortic valve regurgitation is not visualized. No aortic stenosis is present. Aortic valve mean gradient measures 3.0 mmHg. Aortic valve peak gradient measures 6.1 mmHg. Aortic valve area, by VTI measures 2.60 cm. Pulmonic Valve:  The pulmonic valve was normal in structure. Pulmonic valve regurgitation is trivial. No evidence of pulmonic stenosis. Aorta: The aortic root is normal in size and structure. Venous: The inferior vena cava is normal in size with greater than 50% respiratory variability, suggesting right atrial pressure of 3 mmHg. IAS/Shunts: No atrial level shunt detected by color flow Doppler.  LEFT VENTRICLE PLAX 2D LVIDd:         4.00 cm      Diastology LVIDs:         2.70 cm      LV e' medial:    14.60 cm/s LV PW:         1.00 cm      LV E/e' medial:  6.3 LV IVS:        1.00 cm      LV e' lateral:   19.60 cm/s LVOT diam:     2.00 cm      LV E/e' lateral: 4.7 LV SV:         78 LV SV Index:   44 LVOT Area:     3.14 cm  LV Volumes (MOD) LV vol d, MOD A4C: 109.0 ml LV vol s, MOD A4C: 41.3 ml LV SV MOD A4C:     109.0 ml RIGHT VENTRICLE            IVC RV Basal diam:  2.80 cm    IVC diam: 2.00 cm RV S prime:     9.57 cm/s TAPSE (M-mode): 1.8 cm LEFT ATRIUM             Index        RIGHT ATRIUM           Index LA diam:        3.30 cm 1.86 cm/m   RA Area:     11.10 cm LA Vol (A2C):   21.4 ml 12.08 ml/m  RA Volume:   26.90 ml  15.18 ml/m LA Vol (A4C):   40.0 ml 22.57 ml/m LA Biplane Vol: 31.1 ml 17.55 ml/m  AORTIC VALVE AV Area (Vmax):    2.63 cm AV Area (Vmean):   2.51 cm AV Area (VTI):     2.60 cm AV Vmax:  123.00 cm/s AV Vmean:          80.100 cm/s AV VTI:            0.298 m AV Peak Grad:      6.1 mmHg AV Mean Grad:      3.0 mmHg LVOT Vmax:         103.00 cm/s LVOT Vmean:        63.900 cm/s LVOT VTI:          0.247 m LVOT/AV VTI ratio: 0.83  AORTA Ao Root diam: 2.60 cm Ao Asc diam:  3.10 cm MITRAL VALVE               TRICUSPID VALVE MV Area (PHT): 3.68 cm    TR Peak grad:   12.0 mmHg MV Decel Time: 206 msec    TR Vmax:        173.00 cm/s MR Peak grad: 20.1 mmHg MR Vmax:      224.00 cm/s  SHUNTS MV E velocity: 92.70 cm/s  Systemic VTI:  0.25 m MV A velocity: 38.10 cm/s  Systemic Diam: 2.00 cm MV E/A ratio:  2.43  Arvilla Meres MD Electronically signed by Arvilla Meres MD Signature Date/Time: 11/10/2022/11:56:09 AM    Final    VAS [place] LOWER EXTREMITY VENOUS (DVT)  Result Date: 11/10/2022  Lower Venous DVT Study Patient Name:  DORYCE ALBERTO  Date of Exam:   11/10/2022 Medical Rec #: [id number]         Accession #:    [id number] Date of Birth: January 19, 1996          Patient Gender: F Patient Age:   6 years Exam Location:  [facility] Procedure:      VAS [place] LOWER EXTREMITY VENOUS (DVT) Referring Phys: Dow Adolph --------------------------------------------------------------------------------  Indications: History of PE 08-04-2021, negative for PE yesterday.  Comparison Study: 08-05-2021 Prior bilateral lower extremity venous study was                   negative for DVT. Performing Technologist: Jean Rosenthal RDMS, RVT  Examination Guidelines: A complete evaluation includes B-mode imaging, spectral Doppler, color Doppler, and power Doppler as needed of all accessible portions of each vessel. Bilateral testing is considered an integral part of a complete examination. Limited examinations for reoccurring indications may be performed as noted. The reflux portion of the exam is performed with the patient in reverse Trendelenburg.  +---------+---------------+---------+-----------+----------+--------------+ RIGHT    CompressibilityPhasicitySpontaneityPropertiesThrombus Aging +---------+---------------+---------+-----------+----------+--------------+ CFV      Full           Yes      Yes                                 +---------+---------------+---------+-----------+----------+--------------+ SFJ      Full                                                        +---------+---------------+---------+-----------+----------+--------------+ FV Prox  Full                                                        +---------+---------------+---------+-----------+----------+--------------+  FV Mid   Full                                                         +---------+---------------+---------+-----------+----------+--------------+ FV DistalFull                                                        +---------+---------------+---------+-----------+----------+--------------+ PFV      Full                                                        +---------+---------------+---------+-----------+----------+--------------+ POP      Full           Yes      Yes                                 +---------+---------------+---------+-----------+----------+--------------+ PTV      Full                                                        +---------+---------------+---------+-----------+----------+--------------+ PERO     Full                                                        +---------+---------------+---------+-----------+----------+--------------+   +---------+---------------+---------+-----------+----------+--------------+ LEFT     CompressibilityPhasicitySpontaneityPropertiesThrombus Aging +---------+---------------+---------+-----------+----------+--------------+ CFV      Full           Yes      Yes                                 +---------+---------------+---------+-----------+----------+--------------+ SFJ      Full                                                        +---------+---------------+---------+-----------+----------+--------------+ FV Prox  Full                                                        +---------+---------------+---------+-----------+----------+--------------+ FV Mid   Full                                                        +---------+---------------+---------+-----------+----------+--------------+  FV DistalFull                                                        +---------+---------------+---------+-----------+----------+--------------+ PFV      Full                                                         +---------+---------------+---------+-----------+----------+--------------+ POP      Full           Yes      Yes                                 +---------+---------------+---------+-----------+----------+--------------+ PTV      Full                                                        +---------+---------------+---------+-----------+----------+--------------+ PERO     Full                                                        +---------+---------------+---------+-----------+----------+--------------+     Summary: RIGHT: - There is no evidence of deep vein thrombosis in the lower extremity.  - No cystic structure found in the popliteal fossa.  LEFT: - There is no evidence of deep vein thrombosis in the lower extremity.  - No cystic structure found in the popliteal fossa.  *See table(s) above for measurements and observations. Electronically signed by Sherald Hess MD on 11/10/2022 at 10:59:15 AM.    Final    CT Angio Chest PE W and/or Wo Contrast  Result Date: 11/09/2022 CLINICAL DATA:  Fatigue, high probability of pulmonary embolism. EXAM: CT ANGIOGRAPHY CHEST WITH CONTRAST TECHNIQUE: Multidetector CT imaging of the chest was performed using the standard protocol during bolus administration of intravenous contrast. Multiplanar CT image reconstructions and MIPs were obtained to evaluate the vascular anatomy. RADIATION DOSE REDUCTION: This exam was performed according to the departmental dose-optimization program which includes automated exposure control, adjustment of the mA and/or kV according to patient size and/or use of iterative reconstruction technique. CONTRAST:  75mL OMNIPAQUE IOHEXOL 350 MG/ML SOLN COMPARISON:  August 04, 2021. FINDINGS: Cardiovascular: Satisfactory opacification of the pulmonary arteries to the segmental level. No evidence of pulmonary embolism. Normal heart size. No pericardial effusion. Mediastinum/Nodes: No enlarged mediastinal, hilar, or axillary  lymph nodes. Thyroid gland, trachea, and esophagus demonstrate no significant findings. Lungs/Pleura: Lungs are clear. No pleural effusion or pneumothorax. Upper Abdomen: No acute abnormality. Musculoskeletal: No chest wall abnormality. No acute or significant osseous findings. Review of the MIP images confirms the above findings. IMPRESSION: No definite evidence of pulmonary embolus. No acute abnormality seen in the chest. Electronically Signed   By: Lupita Raider M.D.   On: 11/09/2022 18:02  LOS: 0 days   Marycarmen Hagey [employer] on [email or web address]  11/11/2022, 10:58 AM

## 2022-11-11 NOTE — Progress Notes (Unsigned)
Enrolled for [employer] to mail a ZIO XT long term holter monitor to the patients address on file.   Dr. Surry to read. 

## 2022-11-11 NOTE — Care Management (Signed)
  Transition of Care [facility]) Screening Note   Patient Details  Name: Tanya Fisher Date of Birth: 1995-09-02   Transition of Care [facility]) CM/SW Contact:    Gala Lewandowsky, RN Phone Number: 11/11/2022, 12:58 PM    Transition of Care Department [facility]) has reviewed the patient. Patient has Medicaid- PCP appointment established at the [facility]. Appointment is on the AVS. No further home needs identified.

## 2022-11-11 NOTE — Progress Notes (Signed)
   11/11/22 1435  Hand-off documentation  Hand-off Given Given to shift RN/LPN  Report given to (Full Name) Daivd Council. RN   Care turned over at this time to Daivd Council RN and Dalphine Handing RN

## 2022-11-11 NOTE — Progress Notes (Signed)
Rounding Note    Patient Name: Tanya Fisher Date of Encounter: 11/11/2022  [facility] Cardiologist: None   Subjective   Patient feeling well this morning. Reports some overnight chest tightness and headache, since resolved. Has not been out of bed much but not currently feeling lightheaded or dizzy.  Inpatient Medications    Scheduled Meds:  enoxaparin (LOVENOX) injection  40 mg Subcutaneous Q24H   potassium chloride  40 mEq Oral Once   Continuous Infusions:  magnesium sulfate bolus IVPB     PRN Meds: acetaminophen, hydrALAZINE, polyethylene glycol, prochlorperazine   Vital Signs    Vitals:   11/10/22 1700 11/10/22 1943 11/11/22 0041 11/11/22 0407  BP: 122/81 (!) 128/99 (!) 119/90 (!) 152/91  Pulse: 62 (!) 43 (!) 43 (!) 56  Resp: 18 18 18 18   Temp: 98.5 F (36.9 C) 98.5 F (36.9 C) 98.5 F (36.9 C) 97.7 F (36.5 C)  TempSrc: Oral Oral Oral Oral  SpO2: 100% 97% 100% 100%  Weight:      Height:        Intake/Output Summary (Last 24 hours) at 11/11/2022 0812 Last data filed at 11/10/2022 1827 Gross per 24 hour  Intake 882.41 ml  Output --  Net 882.41 ml      11/09/2022    9:51 PM 11/09/2022    4:02 PM 09/21/2022    3:51 PM  Last 3 Weights  Weight (lbs) 162 lb 14.4 oz 164 lb 179 lb  Weight (kg) 73.891 kg 74.39 kg 81.194 kg      Telemetry    Sinus bradycardia in the 40s and what appears to be intermittent wandering atrial pacemaker with occasional varied P wave morphology - Personally Reviewed  ECG    No new tracing - Personally Reviewed  Physical Exam   GEN: No acute distress.   Neck: No JVD Cardiac: RRR, no murmurs, rubs, or gallops.  Respiratory: Clear to auscultation bilaterally. GI: Soft, nontender, non-distended  MS: No edema; No deformity. Neuro:  Nonfocal  Psych: Normal affect   Labs    High Sensitivity Troponin:   Recent Labs  Lab 11/09/22 1622 11/09/22 1908 11/11/22 0414 11/11/22 0634  TROPONINIHS 4 4 4 4       Chemistry Recent Labs  Lab 11/09/22 1618 11/10/22 0151 11/11/22 0414  NA 139 137 138  K 3.5 3.7 3.4*  CL 105 109 105  CO2 23 23 25   GLUCOSE 88 114* 92  BUN 6 6 <5*  CREATININE 0.87 0.92 0.84  CALCIUM 9.9 8.5* 9.2  MG  --  2.0 1.8  PROT 8.3*  --   --   ALBUMIN 4.4  --   --   AST 16  --   --   ALT 13  --   --   ALKPHOS 70  --   --   BILITOT 1.3*  --   --   GFRNONAA >60 >60 >60  ANIONGAP 11 5 8     Lipids No results for input(s): "CHOL", "TRIG", "HDL", "LABVLDL", "LDLCALC", "CHOLHDL" in the last 168 hours.  Hematology Recent Labs  Lab 11/09/22 1618 11/10/22 0151 11/11/22 0414  WBC 4.6 5.3 5.0  RBC 4.52 3.70* 3.97  3.99  HGB 12.6 10.3* 10.9*  HCT 39.5 32.4* 34.9*  MCV 87.4 87.6 87.9  MCH 27.9 27.8 27.5  MCHC 31.9 31.8 31.2  RDW 16.1* 15.9* 15.7*  PLT 302 257 276   Thyroid  Recent Labs  Lab 11/09/22 1908  TSH 0.521  BNPNo results for input(s): "BNP", "PROBNP" in the last 168 hours.  DDimer No results for input(s): "DDIMER" in the last 168 hours.   Radiology    VAS US RENAL ARTERY DUPLEX  Result Date: 11/10/2022 ABDOMINAL VISCERAL Patient Name:  Tanya Fisher  Date of Exam:   11/10/2022 Medical Rec #: [id number]         Accession #:    [id number] Date of Birth: 09/16/1995          Patient Gender: F Patient Age:   50 years Exam Location:  [facility] Procedure:      VAS US RENAL ARTERY DUPLEX Referring Phys: [id number] TIFFANY [place] -------------------------------------------------------------------------------- Indications: Hypertension Limitations: Respiratory variation - limited ability to withhold respirations due to clinical status, bradycardia. Comparison Study: No prior studies. Performing Technologist: Jean Rosenthal RDMS, RVT  Examination Guidelines: A complete evaluation includes B-mode imaging, spectral Doppler, color Doppler, and power Doppler as needed of all accessible portions of each vessel. Bilateral testing is considered an integral part of a  complete examination. Limited examinations for reoccurring indications may be performed as noted.  Duplex Findings: +----------------------+--------+--------+------+--------+ Mesenteric            PSV cm/sEDV cm/sPlaqueComments +----------------------+--------+--------+------+--------+ Aorta Mid                93                          +----------------------+--------+--------+------+--------+ Celiac Artery Proximal  137                          +----------------------+--------+--------+------+--------+ SMA Proximal            146                          +----------------------+--------+--------+------+--------+    +------------------+--------+--------+-------+ Right Renal ArteryPSV cm/sEDV cm/sComment +------------------+--------+--------+-------+ Origin               93      35           +------------------+--------+--------+-------+ Proximal             72      10           +------------------+--------+--------+-------+ Mid                 108      37           +------------------+--------+--------+-------+ Distal              123      42           +------------------+--------+--------+-------+ +-----------------+--------+--------+-------+ Left Renal ArteryPSV cm/sEDV cm/sComment +-----------------+--------+--------+-------+ Origin             108      26           +-----------------+--------+--------+-------+ Proximal           102      29           +-----------------+--------+--------+-------+ Mid                105      42           +-----------------+--------+--------+-------+ Distal              89      41           +-----------------+--------+--------+-------+ +------------+--------+--------+----+-----------+--------+--------+----+  Right KidneyPSV cm/sEDV cm/sRI  Left KidneyPSV cm/sEDV cm/sRI   +------------+--------+--------+----+-----------+--------+--------+----+ Upper Pole  46      18      0.61Upper Pole 34       12      0.65 +------------+--------+--------+----+-----------+--------+--------+----+ Mid         29      10      0.        50      14      0.69 +------------+--------+--------+----+-----------+--------+--------+----+ Lower Pole  28      12      0.57Lower Pole 24      8       0.66 +------------+--------+--------+----+-----------+--------+--------+----+ Hilar       59      28      0.53Hilar      52      15      0.72 +------------+--------+--------+----+-----------+--------+--------+----+ +------------------+-----+------------------+----+ Right Kidney           Left Kidney            +------------------+-----+------------------+----+ RAR                    RAR                    +------------------+-----+------------------+----+ RAR (manual)      1.3  RAR (manual)      1.2  +------------------+-----+------------------+----+ Cortex                 Cortex                 +------------------+-----+------------------+----+ Cortex thickness       Corex thickness        +------------------+-----+------------------+----+ Kidney length (cm)10.14Kidney length (cm)9.88 +------------------+-----+------------------+----+  Summary: Renal:  Right: No evidence of right renal artery stenosis. Normal right        Resisitive Index. Normal size right kidney. RRV flow present. Left:  No evidence of left renal artery stenosis. Normal left        Resistive Index. Normal size of left kidney. LRV flow        present. Mesenteric: Normal Celiac artery and Superior Mesenteric artery findings.  *See table(s) above for measurements and observations.  Diagnosing physician: Lemar Livings MD  Electronically signed by Lemar Livings MD on 11/10/2022 at 4:46:21 PM.    Final    ECHOCARDIOGRAM COMPLETE  Result Date: 11/10/2022    ECHOCARDIOGRAM REPORT   Patient Name:   Tanya Fisher Date of Exam: 11/10/2022 Medical Rec #:  [id number]        Height:       63.0 in Accession #:    [id number]        Weight:       162.9 lb Date of Birth:  April 11, 1996         BSA:          1.772 m Patient Age:    27 years         BP:           117/83 mmHg Patient Gender: F                HR:           50 bpm. Exam Location:  Inpatient Procedure: 2D Echo, Cardiac Doppler and Color Doppler Indications:    Abnormal ECG  History:        Patient has prior history of Echocardiogram examinations, most  recent 08/05/2021. Pulmonary Embolism, DVT,                 Signs/Symptoms:Syncope; Risk Factors:Former Smoker.  Sonographer:    Raeford Razor Sonographer#2:  Milbert Coulter Referring Phys: Darlin Drop IMPRESSIONS  1. Left ventricular ejection fraction, by estimation, is 60 to 65%. The left ventricle has normal function. The left ventricle has no regional wall motion abnormalities. Left ventricular diastolic parameters were normal.  2. Right ventricular systolic function is normal. The right ventricular size is normal. There is normal pulmonary artery systolic pressure.  3. The mitral valve is normal in structure. Trivial mitral valve regurgitation. No evidence of mitral stenosis.  4. The aortic valve is normal in structure. Aortic valve regurgitation is not visualized. No aortic stenosis is present.  5. The inferior vena cava is normal in size with greater than 50% respiratory variability, suggesting right atrial pressure of 3 mmHg. FINDINGS  Left Ventricle: Left ventricular ejection fraction, by estimation, is 60 to 65%. The left ventricle has normal function. The left ventricle has no regional wall motion abnormalities. The left ventricular internal cavity size was normal in size. There is  no left ventricular hypertrophy. Left ventricular diastolic parameters were normal. Right Ventricle: The right ventricular size is normal. No increase in right ventricular wall thickness. Right ventricular systolic function is normal. There is normal pulmonary artery systolic pressure. The tricuspid regurgitant velocity is 1.73 m/s, and   with an assumed right atrial pressure of 3 mmHg, the estimated right ventricular systolic pressure is 15.0 mmHg. Left Atrium: Left atrial size was normal in size. Right Atrium: Right atrial size was normal in size. Pericardium: There is no evidence of pericardial effusion. Mitral Valve: The mitral valve is normal in structure. Trivial mitral valve regurgitation. No evidence of mitral valve stenosis. Tricuspid Valve: The tricuspid valve is normal in structure. Tricuspid valve regurgitation is mild . No evidence of tricuspid stenosis. Aortic Valve: The aortic valve is normal in structure. Aortic valve regurgitation is not visualized. No aortic stenosis is present. Aortic valve mean gradient measures 3.0 mmHg. Aortic valve peak gradient measures 6.1 mmHg. Aortic valve area, by VTI measures 2.60 cm. Pulmonic Valve: The pulmonic valve was normal in structure. Pulmonic valve regurgitation is trivial. No evidence of pulmonic stenosis. Aorta: The aortic root is normal in size and structure. Venous: The inferior vena cava is normal in size with greater than 50% respiratory variability, suggesting right atrial pressure of 3 mmHg. IAS/Shunts: No atrial level shunt detected by color flow Doppler.  LEFT VENTRICLE PLAX 2D LVIDd:         4.00 cm      Diastology LVIDs:         2.70 cm      LV e' medial:    14.60 cm/s LV PW:         1.00 cm      LV E/e' medial:  6.3 LV IVS:        1.00 cm      LV e' lateral:   19.60 cm/s LVOT diam:     2.00 cm      LV E/e' lateral: 4.7 LV SV:         78 LV SV Index:   44 LVOT Area:     3.14 cm  LV Volumes (MOD) LV vol d, MOD A4C: 109.0 ml LV vol s, MOD A4C: 41.3 ml LV SV MOD A4C:     109.0 ml RIGHT VENTRICLE  IVC RV Basal diam:  2.80 cm    IVC diam: 2.00 cm RV S prime:     9.57 cm/s TAPSE (M-mode): 1.8 cm LEFT ATRIUM             Index        RIGHT ATRIUM           Index LA diam:        3.30 cm 1.86 cm/m   RA Area:     11.10 cm LA Vol (A2C):   21.4 ml 12.08 ml/m  RA Volume:   26.90 ml   15.18 ml/m LA Vol (A4C):   40.0 ml 22.57 ml/m LA Biplane Vol: 31.1 ml 17.55 ml/m  AORTIC VALVE AV Area (Vmax):    2.63 cm AV Area (Vmean):   2.51 cm AV Area (VTI):     2.60 cm AV Vmax:           123.00 cm/s AV Vmean:          80.100 cm/s AV VTI:            0.298 m AV Peak Grad:      6.1 mmHg AV Mean Grad:      3.0 mmHg LVOT Vmax:         103.00 cm/s LVOT Vmean:        63.900 cm/s LVOT VTI:          0.247 m LVOT/AV VTI ratio: 0.83  AORTA Ao Root diam: 2.60 cm Ao Asc diam:  3.10 cm MITRAL VALVE               TRICUSPID VALVE MV Area (PHT): 3.68 cm    TR Peak grad:   12.0 mmHg MV Decel Time: 206 msec    TR Vmax:        173.00 cm/s MR Peak grad: 20.1 mmHg MR Vmax:      224.00 cm/s  SHUNTS MV E velocity: 92.70 cm/s  Systemic VTI:  0.25 m MV A velocity: 38.10 cm/s  Systemic Diam: 2.00 cm MV E/A ratio:  2.43 Arvilla Meres MD Electronically signed by Arvilla Meres MD Signature Date/Time: 11/10/2022/11:56:09 AM    Final    VAS [place] LOWER EXTREMITY VENOUS (DVT)  Result Date: 11/10/2022  Lower Venous DVT Study Patient Name:  Tanya Fisher  Date of Exam:   11/10/2022 Medical Rec #: [id number]         Accession #:    [id number] Date of Birth: 07/19/1995          Patient Gender: F Patient Age:   39 years Exam Location:  [facility] Procedure:      VAS [place] LOWER EXTREMITY VENOUS (DVT) Referring Phys: Dow Adolph --------------------------------------------------------------------------------  Indications: History of PE 08-04-2021, negative for PE yesterday.  Comparison Study: 08-05-2021 Prior bilateral lower extremity venous study was                   negative for DVT. Performing Technologist: Jean Rosenthal RDMS, RVT  Examination Guidelines: A complete evaluation includes B-mode imaging, spectral Doppler, color Doppler, and power Doppler as needed of all accessible portions of each vessel. Bilateral testing is considered an integral part of a complete examination. Limited examinations for reoccurring indications may  be performed as noted. The reflux portion of the exam is performed with the patient in reverse Trendelenburg.  +---------+---------------+---------+-----------+----------+--------------+ RIGHT    CompressibilityPhasicitySpontaneityPropertiesThrombus Aging +---------+---------------+---------+-----------+----------+--------------+ CFV      Full  Yes      Yes                                 +---------+---------------+---------+-----------+----------+--------------+ SFJ      Full                                                        +---------+---------------+---------+-----------+----------+--------------+ FV Prox  Full                                                        +---------+---------------+---------+-----------+----------+--------------+ FV Mid   Full                                                        +---------+---------------+---------+-----------+----------+--------------+ FV DistalFull                                                        +---------+---------------+---------+-----------+----------+--------------+ PFV      Full                                                        +---------+---------------+---------+-----------+----------+--------------+ POP      Full           Yes      Yes                                 +---------+---------------+---------+-----------+----------+--------------+ PTV      Full                                                        +---------+---------------+---------+-----------+----------+--------------+ PERO     Full                                                        +---------+---------------+---------+-----------+----------+--------------+   +---------+---------------+---------+-----------+----------+--------------+ LEFT     CompressibilityPhasicitySpontaneityPropertiesThrombus Aging +---------+---------------+---------+-----------+----------+--------------+ CFV       Full           Yes      Yes                                 +---------+---------------+---------+-----------+----------+--------------+ SFJ  Full                                                        +---------+---------------+---------+-----------+----------+--------------+ FV Prox  Full                                                        +---------+---------------+---------+-----------+----------+--------------+ FV Mid   Full                                                        +---------+---------------+---------+-----------+----------+--------------+ FV DistalFull                                                        +---------+---------------+---------+-----------+----------+--------------+ PFV      Full                                                        +---------+---------------+---------+-----------+----------+--------------+ POP      Full           Yes      Yes                                 +---------+---------------+---------+-----------+----------+--------------+ PTV      Full                                                        +---------+---------------+---------+-----------+----------+--------------+ PERO     Full                                                        +---------+---------------+---------+-----------+----------+--------------+     Summary: RIGHT: - There is no evidence of deep vein thrombosis in the lower extremity.  - No cystic structure found in the popliteal fossa.  LEFT: - There is no evidence of deep vein thrombosis in the lower extremity.  - No cystic structure found in the popliteal fossa.  *See table(s) above for measurements and observations. Electronically signed by Sherald Hess MD on 11/10/2022 at 10:59:15 AM.    Final    CT Angio Chest PE W and/or Wo Contrast  Result Date: 11/09/2022 CLINICAL DATA:  Fatigue, high probability of pulmonary embolism. EXAM: CT ANGIOGRAPHY CHEST WITH CONTRAST  TECHNIQUE: Multidetector CT imaging of the chest was performed using the  standard protocol during bolus administration of intravenous contrast. Multiplanar CT image reconstructions and MIPs were obtained to evaluate the vascular anatomy. RADIATION DOSE REDUCTION: This exam was performed according to the departmental dose-optimization program which includes automated exposure control, adjustment of the mA and/or kV according to patient size and/or use of iterative reconstruction technique. CONTRAST:  75mL OMNIPAQUE IOHEXOL 350 MG/ML SOLN COMPARISON:  August 04, 2021. FINDINGS: Cardiovascular: Satisfactory opacification of the pulmonary arteries to the segmental level. No evidence of pulmonary embolism. Normal heart size. No pericardial effusion. Mediastinum/Nodes: No enlarged mediastinal, hilar, or axillary lymph nodes. Thyroid gland, trachea, and esophagus demonstrate no significant findings. Lungs/Pleura: Lungs are clear. No pleural effusion or pneumothorax. Upper Abdomen: No acute abnormality. Musculoskeletal: No chest wall abnormality. No acute or significant osseous findings. Review of the MIP images confirms the above findings. IMPRESSION: No definite evidence of pulmonary embolus. No acute abnormality seen in the chest. Electronically Signed   By: Lupita Raider M.D.   On: 11/09/2022 18:02    Cardiac Studies   11/10/22 TTE  IMPRESSIONS     1. Left ventricular ejection fraction, by estimation, is 60 to 65%. The  left ventricle has normal function. The left ventricle has no regional  wall motion abnormalities. Left ventricular diastolic parameters were  normal.   2. Right ventricular systolic function is normal. The right ventricular  size is normal. There is normal pulmonary artery systolic pressure.   3. The mitral valve is normal in structure. Trivial mitral valve  regurgitation. No evidence of mitral stenosis.   4. The aortic valve is normal in structure. Aortic valve regurgitation is   not visualized. No aortic stenosis is present.   5. The inferior vena cava is normal in size with greater than 50%  respiratory variability, suggesting right atrial pressure of 3 mmHg.   FINDINGS   Left Ventricle: Left ventricular ejection fraction, by estimation, is 60  to 65%. The left ventricle has normal function. The left ventricle has no  regional wall motion abnormalities. The left ventricular internal cavity  size was normal in size. There is   no left ventricular hypertrophy. Left ventricular diastolic parameters  were normal.   Right Ventricle: The right ventricular size is normal. No increase in  right ventricular wall thickness. Right ventricular systolic function is  normal. There is normal pulmonary artery systolic pressure. The tricuspid  regurgitant velocity is 1.73 m/s, and   with an assumed right atrial pressure of 3 mmHg, the estimated right  ventricular systolic pressure is 15.0 mmHg.   Left Atrium: Left atrial size was normal in size.   Right Atrium: Right atrial size was normal in size.   Pericardium: There is no evidence of pericardial effusion.   Mitral Valve: The mitral valve is normal in structure. Trivial mitral  valve regurgitation. No evidence of mitral valve stenosis.   Tricuspid Valve: The tricuspid valve is normal in structure. Tricuspid  valve regurgitation is mild . No evidence of tricuspid stenosis.   Aortic Valve: The aortic valve is normal in structure. Aortic valve  regurgitation is not visualized. No aortic stenosis is present. Aortic  valve mean gradient measures 3.0 mmHg. Aortic valve peak gradient measures  6.1 mmHg. Aortic valve area, by VTI  measures 2.60 cm.   Pulmonic Valve: The pulmonic valve was normal in structure. Pulmonic valve  regurgitation is trivial. No evidence of pulmonic stenosis.   Aorta: The aortic root is normal in size and structure.   Venous: The inferior vena  cava is normal in size with greater than 50%   respiratory variability, suggesting right atrial pressure of 3 mmHg.   IAS/Shunts: No atrial level shunt detected by color flow Doppler.   Patient Profile   Tanya Fisher is a 27 y.o. female with a hx of DVT during pregnancy 2018, PE 07/2021, who is being seen for the evaluation of bradycardia and weakness at the request of Dr. Rito Ehrlich.  Assessment & Plan    Bradyarrhythmia   Patient with HR mostly in the 40s overnight. Telemetry is generally sinus bradycardia, however appears to show some intermittent variation in P wave morphology. No obvious vasovagal trigger. Echocardiogram without structural abnormalities.   No symptoms this morning Continue to follow telemetry and plan for outpatient 14 day Zio with EP clinic follow-up after this results. Avoid rate limited medications  Hypertension  BP elevated this admission. Renal artery duplex without stenosis. Renin/aldosterone lab pending.  Suspect overnight headaches and chest tightness due to hypertension Hydralazine 25mg  q8hr  Menometrorrhagia Anemia  History of DVT during pregnancy 2018 and recent unprovoked PE 2023   Patient previously followed by hem/onc. Appears that she was on [facility] for about 2 months. CTA chest without PE. Iron studies this admission show ferritin 4, folate 5.9, sat ratio 6. HGB 10.9 this am.  Anemia per primary team while inpatient. She will need close outpatient hem/onc follow up.       For questions or updates, please contact [facility] Please consult [email or web address] for contact info under        Signed, Perlie Gold, PA-C  11/11/2022, 8:12 AM

## 2022-11-18 LAB — ALDOSTERONE + RENIN ACTIVITY W/ RATIO
ALDO / PRA Ratio: 5.6 (ref 0.0–30.0)
Aldosterone: 2.1 ng/dL (ref 0.0–30.0)
PRA LC/MS/MS: 0.378 ng/mL/hr (ref 0.167–5.380)

## 2022-11-23 NOTE — Progress Notes (Deleted)
GYNECOLOGY ANNUAL PREVENTATIVE CARE ENCOUNTER NOTE  History:     Tanya Fisher is a 27 y.o. [phone] female here for a routine annual gynecologic exam.  Current complaints: ***.   Denies abnormal vaginal bleeding, discharge, pelvic pain, problems with intercourse or other gynecologic concerns.    Gynecologic History Patient's last menstrual period was 10/27/2022. Contraception: {method:5051} Last Pap: ***. Result was {norm/abn:16337} with negative HPV Last Mammogram: ***.  Result was {norm/abn:16337} Last Colonoscopy: ***.  Result was {norm/abn:16337}  Obstetric History OB History  Gravida Para Term Preterm AB Living  4 3 3  0 1 3  SAB IAB Ectopic Multiple Live Births  0 1 0 0 3    # Outcome Date GA Lbr Len/2nd Weight Sex Delivery Anes PTL Lv  4 Term 04/24/19 [redacted]w[redacted]d  3.485 kg F CS-LTranv Spinal  LIV  3 Term 12/03/17 [redacted]w[redacted]d 06:31 / 00:14 3.28 kg F VBAC Local  LIV  2 IAB 07/2016          1 Term 12/15/15 [redacted]w[redacted]d  2.977 kg M CS-Unspec None N LIV    Past Medical History:  Diagnosis Date   Asthma    DVT (deep vein thrombosis) in pregnancy    Gestational diabetes    Herpes     Past Surgical History:  Procedure Laterality Date   CESAREAN SECTION     CESAREAN SECTION N/A 04/24/2019   Procedure: CESAREAN SECTION;  Surgeon: Levie Heritage, DO;  Location: [facility] LD [facility]S;  Service: Obstetrics;  Laterality: N/A;   DILATION AND EVACUATION     Therapeutic abortion 14 wks    Current Outpatient Medications on File Prior to Visit  Medication Sig Dispense Refill   amLODipine (NORVASC) 5 MG tablet Take 1 tablet (5 mg total) by mouth daily. 30 tablet 2   cyanocobalamin 1000 MCG tablet Take 1 tablet (1,000 mcg total) by mouth daily. 30 tablet 2   ferrous sulfate 325 (65 FE) MG tablet Take 1 tablet (325 mg total) by mouth 2 (two) times daily with a meal. 60 tablet 3   folic acid (FOLVITE) 1 MG tablet Take 1 tablet (1 mg total) by mouth daily. 30 tablet 2   No current facility-administered  medications on file prior to visit.    No Known Allergies  Social History:  reports that she quit smoking about 7 years ago. Her smoking use included cigarettes. She has never used smokeless tobacco. She reports current drug use. Frequency: 1.00 time per week. Drug: Marijuana. She reports that she does not drink alcohol.  Family History  Problem Relation Age of Onset   Hypertension Mother    Diabetes Father    Hypertension Father     The following portions of the patient's history were reviewed and updated as appropriate: allergies, current medications, past family history, past medical history, past social history, past surgical history and problem list.  Review of Systems Pertinent items noted in HPI and remainder of comprehensive ROS otherwise negative.  Physical Exam:  LMP 10/27/2022  CONSTITUTIONAL: Well-developed, well-nourished female in no acute distress.  HENT:  Normocephalic, atraumatic, External right and left ear normal.  EYES: Conjunctivae and EOM are normal. Pupils are equal, round, and reactive to light. No scleral icterus.  NECK: Normal range of motion, supple, no masses.  Normal thyroid.  SKIN: Skin is warm and dry. No rash noted. Not diaphoretic. No erythema. No pallor. MUSCULOSKELETAL: Normal range of motion. No tenderness.  No cyanosis, clubbing, or edema. NEUROLOGIC: Alert and oriented to  person, place, and time. Normal reflexes, muscle tone coordination.  PSYCHIATRIC: Normal mood and affect. Normal behavior. Normal judgment and thought content. CARDIOVASCULAR: Normal heart rate noted, regular rhythm RESPIRATORY: Clear to auscultation bilaterally. Effort and breath sounds normal, no problems with respiration noted. BREASTS: Symmetric in size. No masses, tenderness, skin changes, nipple drainage, or lymphadenopathy bilaterally. Performed in the presence of a chaperone. ABDOMEN: Soft, no distention noted.  No tenderness, rebound or guarding.  PELVIC: Normal  appearing external genitalia and urethral meatus; normal appearing vaginal mucosa and cervix.  No abnormal vaginal discharge noted.  Pap smear obtained.  Normal uterine size, no other palpable masses, no uterine or adnexal tenderness.  Performed in the presence of a chaperone.   Assessment and Plan:    There are no diagnoses linked to this encounter. Will follow up results of pap smear and manage accordingly. Mammogram scheduled Colon cancer screening is up to date***Discussed need for colon cancer screening over the age 64. Colonoscopy recommended as this is the gold standard. Referral made to Gastroenterology for colonoscopy*** Patient declined colonoscopy, so discussed Cologuard.   Emphasized that positive Cologuard tests will need to be follow up with diagnostic colonoscopy. Cologuard ordered.***Patient will decide and let us know her decision. Routine preventative health maintenance measures emphasized. Please refer to After Visit Summary for other counseling recommendations.      Lavonda Jumbo, DO OB Fellow, Faculty [facility], [facility] for [facility] 11/23/2022, 6:41 PM

## 2022-11-24 ENCOUNTER — Other Ambulatory Visit: Payer: Self-pay | Admitting: *Deleted

## 2022-11-24 ENCOUNTER — Ambulatory Visit: Payer: Medicaid Other | Attending: Cardiovascular Disease

## 2022-11-24 DIAGNOSIS — R001 Bradycardia, unspecified: Secondary | ICD-10-CM

## 2022-11-24 NOTE — Progress Notes (Unsigned)
Patient allergic to ZIO XT patch.  Enrolled for [employer] to ship a 14 day long term holter monitor to her address of file with a sensitive skin set up of 2 Bridges with 40m Red Dot Repositionable electrodes 2660-5 X4.

## 2022-11-25 ENCOUNTER — Inpatient Hospital Stay: Payer: Medicaid Other | Admitting: Physician Assistant

## 2022-11-25 ENCOUNTER — Ambulatory Visit: Payer: Medicaid Other | Admitting: Family Medicine

## 2022-12-03 ENCOUNTER — Encounter: Payer: Self-pay | Admitting: Cardiovascular Disease

## 2022-12-03 ENCOUNTER — Ambulatory Visit: Payer: Medicaid Other | Attending: Cardiovascular Disease | Admitting: Cardiovascular Disease

## 2022-12-03 VITALS — BP 102/70 | HR 54 | Ht 63.0 in | Wt 167.4 lb

## 2022-12-03 DIAGNOSIS — R55 Syncope and collapse: Secondary | ICD-10-CM | POA: Diagnosis not present

## 2022-12-03 DIAGNOSIS — R001 Bradycardia, unspecified: Secondary | ICD-10-CM

## 2022-12-03 NOTE — Patient Instructions (Signed)
Medication Instructions:  Your physician recommends that you continue on your current medications as directed. Please refer to the Current Medication list given to you today. *If you need a refill on your cardiac medications before your next appointment, please call your pharmacy*   Follow-Up: At [facility], you and your health needs are our priority.  As part of our continuing mission to provide you with exceptional heart care, we have created designated Provider Care Teams.  These Care Teams include your primary Cardiologist (physician) and Advanced Practice Providers (APPs -  Physician Assistants and Nurse Practitioners) who all work together to provide you with the care you need, when you need it.  We recommend signing up for the patient portal called "MyChart".  Sign up information is provided on this After Visit Summary.  MyChart is used to connect with patients for Virtual Visits (Telemedicine).  Patients are able to view lab/test results, encounter notes, upcoming appointments, etc.  Non-urgent messages can be sent to your provider as well.   To learn more about what you can do with MyChart, go to [email or web address].    Your next appointment:   6-8 week(s)  Provider:   York Pellant, MD

## 2022-12-03 NOTE — Progress Notes (Signed)
Electrophysiology Office Note:    Date:  12/03/2022   ID:  Tanya Fisher, DOB [birth year], MRN [id number]  PCP:  Patient, No Pcp Per   [facility] Providers Cardiologist:  None     Referring MD: No ref. provider found   History of Present Illness:    Tanya Fisher is a 27 y.o. female with a hx listed below, significant for DVT during pregnancy, provoked PE referred for arrhythmia management.  She was admitted to the ER in May with severe bradycardia and a near syncopal episode. Heart rate at the time was often 30's-40's in sinus rhythm. A monitor was ordered, but she has not completed it.  She does have episodes of lightheadedness. No frank syncope. She has increased her fluid intake; drinking water.  Past Medical History:  Diagnosis Date   Asthma    DVT (deep vein thrombosis) in pregnancy    Gestational diabetes    Herpes     Past Surgical History:  Procedure Laterality Date   CESAREAN SECTION     CESAREAN SECTION N/A 04/24/2019   Procedure: CESAREAN SECTION;  Surgeon: Levie Heritage, DO;  Location: [facility] LD ORS;  Service: Obstetrics;  Laterality: N/A;   DILATION AND EVACUATION     Therapeutic abortion 14 wks    Current Medications: Current Meds  Medication Sig   amLODipine (NORVASC) 5 MG tablet Take 1 tablet (5 mg total) by mouth daily.   cyanocobalamin 1000 MCG tablet Take 1 tablet (1,000 mcg total) by mouth daily.   ferrous sulfate 325 (65 FE) MG tablet Take 1 tablet (325 mg total) by mouth 2 (two) times daily with a meal.   folic acid (FOLVITE) 1 MG tablet Take 1 tablet (1 mg total) by mouth daily.     Allergies:   Patient has no known allergies.   Social and Family History: Reviewed in Epic  ROS:   Please see the history of present illness.    All other systems reviewed and are negative.  EKGs/Labs/Other Studies Reviewed Today:    Echocardiogram:  TTE 11/10/2022 EF 60-65%. Normal structure and function    Monitors:  Ordered - not  completed Results pending  Stress testing:   Advanced imaging:   Cardiac catherization   EKG:  Last EKG results: today -- sinus bradycardia, sinus arrhythmia, HR 54 bpm   Recent Labs: 11/09/2022: ALT 13; TSH 0.521 11/11/2022: BUN <5; Creatinine, Ser 0.84; Hemoglobin 10.9; Magnesium 1.8; Platelets 276; Potassium 3.4; Sodium 138     Physical Exam:    VS:  BP 102/70 (BP Location: Left Arm, Patient Position: Sitting, Cuff Size: Normal)   Pulse (!) 54   Ht 5\' 3"  (1.6 m)   Wt 167 lb 6.4 oz (75.9 kg)   LMP 10/27/2022   SpO2 97%   BMI 29.65 kg/m     Wt Readings from Last 3 Encounters:  12/03/22 167 lb 6.4 oz (75.9 kg)  11/09/22 162 lb 14.4 oz (73.9 kg)  09/21/22 179 lb (81.2 kg)     GEN: Well nourished, well developed in no acute distress CARDIAC: RRR, no murmurs, rubs, gallops RESPIRATORY:  Normal work of breathing MUSCULOSKELETAL: no edema    ASSESSMENT & PLAN:    Sinus bradycardia Associated with lightheadedness, near syncope Waiting for her to complete her monitor to assess for chronotropic competence I do not think she would be a good candidate for pacemaker Encouraged exercise, may try to increase caffeine  Medication Adjustments/Labs and Tests Ordered: Current medicines are reviewed at length with the patient today.  Concerns regarding medicines are outlined above.  Orders Placed This Encounter  Procedures   EKG 12-Lead   No orders of the defined types were placed in this encounter.    Signed, Maurice Small, MD  12/03/2022 11:40 AM    [facility]

## 2022-12-04 ENCOUNTER — Inpatient Hospital Stay: Payer: Medicaid Other | Attending: Hematology | Admitting: Hematology

## 2022-12-04 ENCOUNTER — Telehealth: Payer: Self-pay | Admitting: Hematology

## 2022-12-04 NOTE — Telephone Encounter (Signed)
Called both numbers on patient chart. Patient did not answer. Left a vm for patient to call back to get rescheduled for appointment

## 2022-12-09 ENCOUNTER — Inpatient Hospital Stay: Payer: Medicaid Other | Admitting: Physician Assistant

## 2022-12-09 NOTE — Progress Notes (Deleted)
Patient ID: Tanya Fisher, female   DOB: August 07, 1995, 27 y.o.   MRN: [id number] INITIAL HISTORY: 27 y.o. female with medical history significant for DVT during pregnancy in 2018 treated with Lovenox and subsequently discontinued after pregnancy, presented on 08/04/2021 with unprovoked PE received heparin drip and transitioned to Eliquis.  Was discharged on Eliquis and advised to follow-up with hematology oncology outpatient.  She completed 2 months of Eliquis and did not follow up with hematology oncology.  The patient is currently not on blood thinners and is not on any prescribed medications.  Presented with near syncopal episodes and palpitations. In the ED, she was noted to be profoundly bradycardic with heart rate in the 30s to 40s. She had a CT angio chest that was negative for pulmonary embolism.  TSH was normal, troponins were normal.  EKG revealed sinus bradycardia with PACs, rate of 42 and QTc of 398.  Patient was hospitalized for further management.   Near syncope with palpitations/bradycardia Noted to have bradycardia on EKG.  Suspicion for symptomatic bradycardia.  TSH noted to be normal.  Echocardiogram shows normal systolic function. Cardiology to arrange Zio patch for monitoring in the outpatient setting.   History of recurrent Venous thromboembolism Had a DVT in 2018 and pulmonary embolism in 2023.  Not on anticoagulation at this time.  CT angiogram was negative for PE.  Lower extremity Doppler studies have been negative for DVT. She did not follow-up with hematology after previous admission in 2023. No PE or DVT identified during this admission.  Patient with history of menorrhagia which increases risk of bleeding if placed on anticoagulation.  Since there is no active clot at this time no clear indication to initiate anticoagulation.  She will benefit from being seen by hematology in the outpatient setting considering her multiple episodes of venous thromboembolism. Will send ambulatory  referral to hematology/oncology.   Elevated blood pressure No documented history of essential hypertension.  Blood pressure is noted to be elevated at times. Started on hydralazine by cardiology.  Continue to monitor. Episode of chest pain overnight noted.  Troponin levels normal.  EKG did not show any ischemic changes.  Elevated blood pressure thought to be responsible for her symptoms.   Echocardiogram shows normal systolic function without any wall motion abnormalities.   No renal artery stenosis noted on vascular study.   Normocytic anemia deficiency/folic acid deficiency Drop in hemoglobin is likely dilutional.  No evidence for overt bleeding.  Initial hemoglobin might have hemoconcentrated since she seems to be currently at her baseline. Patient does report a history of menorrhagia which is likely responsible for her iron deficiency.  Agreeable to iron infusion.  Will benefit from iron supplementation at discharge.  Will also initiate folic acid supplementation along with B12 supplementation.   Menorrhagia Patient should follow-up with GYN in the outpatient setting.

## 2022-12-10 ENCOUNTER — Encounter (HOSPITAL_BASED_OUTPATIENT_CLINIC_OR_DEPARTMENT_OTHER): Payer: Self-pay | Admitting: Emergency Medicine

## 2022-12-10 ENCOUNTER — Emergency Department (HOSPITAL_BASED_OUTPATIENT_CLINIC_OR_DEPARTMENT_OTHER)
Admission: EM | Admit: 2022-12-10 | Discharge: 2022-12-10 | Disposition: A | Discharge status: Home / Self Care | Payer: Medicaid Other | Attending: Emergency Medicine | Admitting: Emergency Medicine

## 2022-12-10 ENCOUNTER — Other Ambulatory Visit: Payer: Self-pay

## 2022-12-10 DIAGNOSIS — R5383 Other fatigue: Secondary | ICD-10-CM | POA: Insufficient documentation

## 2022-12-10 DIAGNOSIS — R1110 Vomiting, unspecified: Secondary | ICD-10-CM | POA: Diagnosis not present

## 2022-12-10 DIAGNOSIS — R197 Diarrhea, unspecified: Secondary | ICD-10-CM | POA: Diagnosis not present

## 2022-12-10 DIAGNOSIS — R001 Bradycardia, unspecified: Secondary | ICD-10-CM | POA: Diagnosis not present

## 2022-12-10 LAB — BASIC METABOLIC PANEL
Anion gap: 5 (ref 5–15)
BUN: 8 mg/dL (ref 6–20)
CO2: 27 mmol/L (ref 22–32)
Calcium: 9.4 mg/dL (ref 8.9–10.3)
Chloride: 106 mmol/L (ref 98–111)
Creatinine, Ser: 0.82 mg/dL (ref 0.44–1.00)
GFR, Estimated: >60 mL/min (ref >60)
Glucose, Bld: 102 mg/dL — ABNORMAL HIGH (ref 70–99)
Potassium: 3.6 mmol/L (ref 3.5–5.1)
Sodium: 138 mmol/L (ref 135–145)

## 2022-12-10 LAB — CBC WITH DIFFERENTIAL/PLATELET
Abs Immature Granulocytes: 0.01 10*3/uL (ref 0.00–0.07)
Basophils Absolute: 0 10*3/uL (ref 0.0–0.1)
Basophils Relative: 1 %
Eosinophils Absolute: 0 10*3/uL (ref 0.0–0.5)
Eosinophils Relative: 1 %
HCT: 35.6 % — ABNORMAL LOW (ref 36.0–46.0)
Hemoglobin: 11.4 g/dL — ABNORMAL LOW (ref 12.0–15.0)
Immature Granulocytes: 0 %
Lymphocytes Relative: 42 %
Lymphs Abs: 1.8 10*3/uL (ref 0.7–4.0)
MCH: 28.9 pg (ref 26.0–34.0)
MCHC: 32 g/dL (ref 30.0–36.0)
MCV: 90.4 fL (ref 80.0–100.0)
Monocytes Absolute: 0.3 10*3/uL (ref 0.1–1.0)
Monocytes Relative: 6 %
Neutro Abs: 2.2 10*3/uL (ref 1.7–7.7)
Neutrophils Relative %: 50 %
Platelets: 231 10*3/uL (ref 150–400)
RBC: 3.94 MIL/uL (ref 3.87–5.11)
RDW: 17.2 % — ABNORMAL HIGH (ref 11.5–15.5)
WBC: 4.4 10*3/uL (ref 4.0–10.5)
nRBC: 0 % (ref 0.0–0.2)

## 2022-12-10 LAB — CBG MONITORING, ED: Glucose-Capillary: 104 mg/dL — ABNORMAL HIGH (ref 70–99)

## 2022-12-10 LAB — PREGNANCY, URINE: Preg Test, Ur: NEGATIVE

## 2022-12-10 NOTE — ED Provider Notes (Signed)
[place] EMERGENCY DEPARTMENT AT [facility] Provider Note   CSN: [id number] Arrival date & time: 12/10/22  1324     History  Chief Complaint  Patient presents with   Weakness    Tanya Fisher is a 27 y.o. female.  27 yo F with a chief complaint of feeling fatigued.  This seems to occur after she is up for about an hour.  She feels a little bit weak and feels like she might pass out.  Tends to be able to continue her activities of daily living.  She denies any syncopal episodes.  Improves with rest and with cold air.  She a GI bug at the beginning of this week.  States she had some vomiting and diarrhea.  This is resolved.  She is currently being evaluated for bradycardia, has seen cardiology has plans to have a repeat cardiac monitor.   Weakness      Home Medications Prior to Admission medications   Medication Sig Start Date End Date Taking? Authorizing Provider  amLODipine (NORVASC) 5 MG tablet Take 1 tablet (5 mg total) by mouth daily. 11/11/22   Osvaldo Shipper, MD  cyanocobalamin 1000 MCG tablet Take 1 tablet (1,000 mcg total) by mouth daily. 11/11/22   Osvaldo Shipper, MD  ferrous sulfate 325 (65 FE) MG tablet Take 1 tablet (325 mg total) by mouth 2 (two) times daily with a meal. 11/12/22   Osvaldo Shipper, MD  folic acid (FOLVITE) 1 MG tablet Take 1 tablet (1 mg total) by mouth daily. 11/11/22   Osvaldo Shipper, MD      Allergies    Patient has no known allergies.    Review of Systems   Review of Systems  Neurological:  Positive for weakness.    Physical Exam Updated Vital Signs BP (!) 122/90   Pulse (!) 50   Temp 97.8 F (36.6 C)   Resp 17   Ht 5\' 3"  (1.6 m)   Wt 74.8 kg   SpO2 99%   BMI 29.23 kg/m  Physical Exam Vitals and nursing note reviewed.  Constitutional:      General: She is not in acute distress.    Appearance: She is well-developed. She is not diaphoretic.  HENT:     Head: Normocephalic and atraumatic.  Eyes:     Pupils: Pupils  are equal, round, and reactive to light.  Cardiovascular:     Rate and Rhythm: Regular rhythm. Bradycardia present.     Heart sounds: No murmur heard.    No friction rub. No gallop.  Pulmonary:     Effort: Pulmonary effort is normal.     Breath sounds: No wheezing or rales.  Abdominal:     General: There is no distension.     Palpations: Abdomen is soft.     Tenderness: There is no abdominal tenderness.  Musculoskeletal:        General: No tenderness.     Cervical back: Normal range of motion and neck supple.  Skin:    General: Skin is warm and dry.  Neurological:     Mental Status: She is alert and oriented to person, place, and time.  Psychiatric:        Behavior: Behavior normal.     ED Results / Procedures / Treatments   Labs (all labs ordered are listed, but only abnormal results are displayed) Labs Reviewed  CBC WITH DIFFERENTIAL/PLATELET - Abnormal; Notable for the following components:      Result Value   Hemoglobin  11.4 (*)    HCT 35.6 (*)    RDW 17.2 (*)    All other components within normal limits  BASIC METABOLIC PANEL - Abnormal; Notable for the following components:   Glucose, Bld 102 (*)    All other components within normal limits  CBG MONITORING, ED - Abnormal; Notable for the following components:   Glucose-Capillary 104 (*)    All other components within normal limits  PREGNANCY, URINE    EKG EKG Interpretation  Date/Time:  Friday December 10 2022 07:19:14 EDT Ventricular Rate:  53 PR Interval:  248 QRS Duration: 87 QT Interval:  425 QTC Calculation: 399 R Axis:   65 Text Interpretation: Sinus rhythm Prolonged PR interval No significant change since last tracing Confirmed by Melene Plan [phone]) on 12/10/2022 7:55:41 AM  Radiology No results found.  Procedures .1-3 Lead EKG Interpretation  Performed by: Melene Plan, DO Authorized by: Melene Plan, DO     Interpretation: abnormal     ECG rate:  45   ECG rate assessment: bradycardic     Rhythm:  sinus rhythm     Ectopy: none     Conduction: normal       Medications Ordered in ED Medications - No data to display  ED Course/ Medical Decision Making/ A&P                             Medical Decision Making Amount and/or Complexity of Data Reviewed Labs: ordered. ECG/medicine tests: ordered.   27 yo F with a significant past medical history of bradycardia requiring hospitalization comes in with a chief complaint of feeling fatigued after up and moving for about an hour at a time.  She is bradycardic here.  Appears to be sinus on the monitor.  She had a GI bug at the beginning of the week.  Will obtain a laboratory evaluation.  Keep on the monitor.  Reassess.  Patient continues to have episodes of bradycardia here in the ED.  Not symptomatic with it.  Lab work is resulted without acute anemia, no significant electrolyte abnormality.  Pregnancy test is negative.  Will have her follow-up with her cardiologist in the office.  8:24 AM:  I have discussed the diagnosis/risks/treatment options with the patient and family.  Evaluation and diagnostic testing in the emergency department does not suggest an emergent condition requiring admission or immediate intervention beyond what has been performed at this time.  They will follow up with Cards. We also discussed returning to the ED immediately if new or worsening sx occur. We discussed the sx which are most concerning (e.g., sudden worsening pain, fever, inability to tolerate by mouth, syncope) that necessitate immediate return. Medications administered to the patient during their visit and any new prescriptions provided to the patient are listed below.  Medications given during this visit Medications - No data to display   The patient appears reasonably screen and/or stabilized for discharge and I doubt any other medical condition or other [facility] requiring further screening, evaluation, or treatment in the ED at this time prior to discharge.           Final Clinical Impression(s) / ED Diagnoses Final diagnoses:  Fatigue, unspecified type  Bradycardia    Rx / DC Orders ED Discharge Orders          Ordered    Ambulatory referral to Cardiology       Comments: If you have not heard from  the Cardiology office within the next 72 hours please call [phone].   12/10/22 0822              Melene Plan, DO 12/10/22 [phone]

## 2022-12-10 NOTE — Discharge Instructions (Signed)
There is no obvious electrolyte abnormality or loss of blood.  Your pregnancy test was negative.  Your symptoms unfortunately are nonspecific.  Your heart rate continues to be slow.  I am not sure what role that has to play with this.  Please follow-up with the cardiologist in the office.

## 2022-12-10 NOTE — ED Triage Notes (Signed)
Pt c/o feeling dizzy and weak. States that this happened yesterday as well but resolved.

## 2022-12-19 ENCOUNTER — Emergency Department (HOSPITAL_BASED_OUTPATIENT_CLINIC_OR_DEPARTMENT_OTHER)
Admission: EM | Admit: 2022-12-19 | Discharge: 2022-12-19 | Disposition: A | Discharge status: Home / Self Care | Payer: Medicaid Other | Attending: Emergency Medicine | Admitting: Emergency Medicine

## 2022-12-19 ENCOUNTER — Encounter (HOSPITAL_BASED_OUTPATIENT_CLINIC_OR_DEPARTMENT_OTHER): Payer: Self-pay

## 2022-12-19 ENCOUNTER — Telehealth (HOSPITAL_BASED_OUTPATIENT_CLINIC_OR_DEPARTMENT_OTHER): Payer: Self-pay | Admitting: Emergency Medicine

## 2022-12-19 ENCOUNTER — Emergency Department (HOSPITAL_BASED_OUTPATIENT_CLINIC_OR_DEPARTMENT_OTHER): Payer: Medicaid Other | Admitting: Radiology

## 2022-12-19 DIAGNOSIS — M25562 Pain in left knee: Secondary | ICD-10-CM | POA: Insufficient documentation

## 2022-12-19 MED ORDER — OXYCODONE-ACETAMINOPHEN 5-325 MG PO TABS
1.0000 | ORAL_TABLET | Freq: Once | ORAL | Status: DC
  Filled 2022-12-19: qty 1

## 2022-12-19 MED ORDER — OXYCODONE-ACETAMINOPHEN 5-325 MG PO TABS: 1.0000 | ORAL_TABLET | Freq: Four times a day (QID) | ORAL | 0 refills | Status: DC | PRN

## 2022-12-19 NOTE — Discharge Instructions (Addendum)
You were seen in the emergency department for a knee injury.  Your x-ray imaging was negative for any fractures or dislocations.  There is a small joint effusion or swelling of the knee.  Your physical exam was largely unremarkable but there is some concern about a possible ligamental injury so I would advise a follow-up with orthopedics if you are having slow or no improvement in your symptoms.  If you feel your symptoms are worsening please return the emergency department.

## 2022-12-19 NOTE — ED Provider Notes (Signed)
[place] [facility]ERGENC[facility] DEPARTMENT AT [facility] Provider Note   CSN: [id number] Arrival date & time: 12/19/22  1210     History Chief Complaint  Patient presents with   Knee Injury    Tanya Fisher is a 27 y.o. female.  Patient presents to the emergency department with complaints of a knee injury.  She reports that she slipped and fell while she was at a pool yesterday injuring her left knee.  Denies any obvious deformity or laceration.  Denies any audible pop or instability in the knee following the injury.  Has been able to ambulate without significant difficulty but there is notable pain with ambulation.  HPI     Home Medications Prior to Admission medications   Medication Sig Start Date End Date Taking? Authorizing Provider  oxyCODONE-acetaminophen (PERCOCET/ROXICET) 5-325 MG tablet Take 1 tablet by mouth every 6 (six) hours as needed for severe pain. 12/19/22  Yes Maryanna Shape A, PA-C  amLODipine (NORVASC) 5 MG tablet Take 1 tablet (5 mg total) by mouth daily. 11/11/22   Osvaldo Shipper, MD  cyanocobalamin 1000 MCG tablet Take 1 tablet (1,000 mcg total) by mouth daily. 11/11/22   Osvaldo Shipper, MD  ferrous sulfate 325 (65 FE) MG tablet Take 1 tablet (325 mg total) by mouth 2 (two) times daily with a meal. 11/12/22   Osvaldo Shipper, MD  folic acid (FOLVITE) 1 MG tablet Take 1 tablet (1 mg total) by mouth daily. 11/11/22   Osvaldo Shipper, MD      Allergies    Patient has no known allergies.    Review of Systems   Review of Systems  Musculoskeletal:  Positive for joint swelling.  All other systems reviewed and are negative.   Physical Exam Updated Vital Signs BP 105/78   Pulse (!) 49   Temp 97.8 F (36.6 C) (Oral)   Resp 16   Ht 5\' 3"  (1.6 m)   Wt 71.2 kg   LMP 12/05/2022   SpO2 100%   BMI 27.81 kg/m  Physical Exam Vitals and nursing note reviewed.  HENT:     Head: Normocephalic and atraumatic.  Eyes:     General: No scleral icterus.       Right  eye: No discharge.        Left eye: No discharge.  Cardiovascular:     Rate and Rhythm: Normal rate and regular rhythm.  Musculoskeletal:        General: Swelling, tenderness and signs of injury present. No deformity. Normal range of motion.     Comments: Mild swelling noted to the left knee without significant bruising in the joint area.  Pain with valgus and varus stress testing.  Negative Lachman sign.  Skin:    Findings: No rash.     ED Results / Procedures / Treatments   Labs (all labs ordered are listed, but only abnormal results are displayed) Labs Reviewed - No data to display  EKG None  Radiology DG Knee Complete 4 Views Left  Result Date: 12/19/2022 CLINICAL DATA:  Recent fall, pain EXAM: LEFT KNEE - COMPLETE 4+ VIEW COMPARISON:  04/30/2010 FINDINGS: No fracture or dislocation is seen. There is soft tissue fullness in suprapatellar bursa suggesting possible small effusion. IMPRESSION: No fracture or dislocation is seen.  Possible small effusion. Electronically Signed   By: Ernie Avena M.D.   On: 12/19/2022 14:04    Procedures Procedures   Medications Ordered in ED Medications  oxyCODONE-acetaminophen (PERCOCET/ROXICET) 5-325 MG per tablet 1 tablet (  has no administration in time range)    ED Course/ Medical Decision Making/ A&P                           Medical Decision Making Risk Prescription drug management.   This patient presents to the ED for concern of knee injury.  Differential diagnosis includes patellar dislocation, tibial plateau fracture, quadriceps tendon rupture, joint effusion, DVT  Imaging Studies ordered:  I ordered imaging studies including x-ray of left knee I independently visualized and interpreted imaging which showed no obvious bony deformity.  Small joint effusion present I agree with the radiologist interpretation   Medicines ordered and prescription drug management:  I ordered medication including Percocet for  pain Reevaluation of the patient after these medicines showed that the patient improved I have reviewed the patients home medicines and have made adjustments as needed   Problem List / ED Course:  Patient presents to the emergency department complaints of a knee injury.  She reports that this has been present for approximately a day or so after she tripped and fell while she was walking around the pool.  Denies any obvious deformity following the injury.  Able to walk and bear weight with some limitation due to pain.  X-ray imaging ordered from triage for evaluation of suspected injury. X-ray imaging was negative for any acute fracture or dislocation.  Physical exam findings are concerning for possible MCL injury given tenderness along the medial aspect of the knee with valgus and varus stress testing.  Negative Lachman.  Dose of Percocet given in the emergency department for pain control.  Will plan on having patient evaluated by orthopedics in the outpatient setting for further recommendations.  Patient is currently stable for discharge home at this time.  Discussed strict return precautions.  Patient is agreeable treatment plan verbalized understanding all return precautions.  Final Clinical Impression(s) / ED Diagnoses Final diagnoses:  Acute pain of left knee    Rx / DC Orders ED Discharge Orders          Ordered    oxyCODONE-acetaminophen (PERCOCET/ROXICET) 5-325 MG tablet  Every 6 hours PRN        12/19/22 1700              Smitty Knudsen, PA-C 12/19/22 1700    Tegeler, Canary Brim, MD 12/19/22 2213

## 2022-12-19 NOTE — ED Triage Notes (Signed)
Larey Seat in pool yesterday injuring left knee

## 2023-01-16 ENCOUNTER — Ambulatory Visit (HOSPITAL_COMMUNITY)
Admission: EM | Admit: 2023-01-16 | Discharge: 2023-01-16 | Disposition: A | Discharge status: Home / Self Care | Payer: Medicaid Other | Attending: Physician Assistant | Admitting: Physician Assistant

## 2023-01-16 ENCOUNTER — Encounter (HOSPITAL_COMMUNITY): Payer: Self-pay

## 2023-01-16 DIAGNOSIS — N898 Other specified noninflammatory disorders of vagina: Secondary | ICD-10-CM

## 2023-01-16 DIAGNOSIS — Z3202 Encounter for pregnancy test, result negative: Secondary | ICD-10-CM

## 2023-01-16 DIAGNOSIS — Z113 Encounter for screening for infections with a predominantly sexual mode of transmission: Secondary | ICD-10-CM | POA: Diagnosis present

## 2023-01-16 HISTORY — DX: Essential (primary) hypertension: I10

## 2023-01-16 LAB — POCT URINE PREGNANCY: Preg Test, Ur: NEGATIVE

## 2023-01-16 MED ORDER — METRONIDAZOLE 500 MG PO TABS: 500.0000 mg | ORAL_TABLET | Freq: Two times a day (BID) | ORAL | 0 refills | Status: DC

## 2023-01-16 NOTE — Discharge Instructions (Signed)
We are treating you for bacterial vaginosis.  Take metronidazole twice daily for 7 days.  Do not drink any alcohol while on this medication as it will cause you to vomit.  We will contact you if any of your other testing is abnormal and we need to arrange additional treatment.  Abstain from sex until you receive the results.  Use a condom for the sexual encounter.  Use hypoallergenic soaps and detergents and wear loosefitting cotton underwear.  If you have any worsening or changing symptoms including abnormal discharge, pelvic pain, abdominal pain, fever, nausea, vomiting you should be reevaluated.

## 2023-01-16 NOTE — ED Provider Notes (Signed)
[facility]    CSN: [id number] Arrival date & time: 01/16/23  1111      History   Chief Complaint Chief Complaint  Patient presents with   Possible Pregnancy    HPI Tanya Fisher is a 27 y.o. female.   Patient presents today with a 4-day history of vaginal odor.  She describes this as a fishy smell.  She denies any significant vaginal discharge, pelvic pain, abdominal pain, fever, nausea, vomiting.  She is concerned for STI as she recently found out that her significant other cheated on her.  She is requesting complete STI panel.  She reports a remote history of chlamydia but completed treatment.  Denies any change to her personal hygiene products including soaps, detergents, medications.  Denies any recent antibiotic use.  She is also requesting pregnancy testing.  Reports her last menstrual cycle was around 12/05/2022.  She is not on any hormonal birth control.      Past Medical History:  Diagnosis Date   Asthma    DVT (deep vein thrombosis) in pregnancy    Gestational diabetes    Herpes    Hypertension     Patient Active Problem List   Diagnosis Date Noted   Symptomatic bradycardia 11/10/2022   Essential hypertension 11/10/2022   Near syncope 11/09/2022   Pulmonary embolism (HCC) 08/04/2021   Recurrent HSV (herpes simplex virus) 10/11/2019   Vaginal itching 10/11/2019   Status post cesarean section 04/24/2019   History of cesarean delivery 04/09/2019   History of herpes simplex infection 02/28/2019   GDM (gestational diabetes mellitus) 02/28/2019   Trichimoniasis 01/29/2019   Substance abuse affecting pregnancy, 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   Supervision of high risk pregnancy, antepartum 04/29/2017   DVT (deep vein thrombosis) in pregnancy 04/29/2017   History of VBAC 04/29/2017    Past Surgical History:  Procedure Laterality Date   CESAREAN SECTION     CESAREAN SECTION N/A 04/24/2019   Procedure:  CESAREAN SECTION;  Surgeon: Levie Heritage, DO;  Location: [facility];  Service: Obstetrics;  Laterality: N/A;   DILATION AND EVACUATION     Therapeutic abortion 14 wks    OB History     Gravida  4   Para  3   Term  3   Preterm  0   AB  1   Living  3      SAB  0   IAB  1   Ectopic  0   Multiple  0   Live Births  3            Home Medications    Prior to Admission medications   Medication Sig Start Date End Date Taking? Authorizing Provider  metroNIDAZOLE (FLAGYL) 500 MG tablet Take 1 tablet (500 mg total) by mouth 2 (two) times daily. 01/16/23  Yes Selita Staiger K, PA-C  amLODipine (NORVASC) 5 MG tablet Take 1 tablet (5 mg total) by mouth daily. 11/11/22   Osvaldo Shipper, MD  cyanocobalamin 1000 MCG tablet Take 1 tablet (1,000 mcg total) by mouth daily. 11/11/22   Osvaldo Shipper, MD  ferrous sulfate 325 (65 FE) MG tablet Take 1 tablet (325 mg total) by mouth 2 (two) times daily with a meal. 11/12/22   Osvaldo Shipper, MD  folic acid (FOLVITE) 1 MG tablet Take 1 tablet (1 mg total) by mouth daily. 11/11/22   Osvaldo Shipper, MD    Family History Family History  Problem Relation  Age of Onset   Hypertension Mother    Diabetes Father    Hypertension Father     Social History Social History   Tobacco Use   Smoking status: Former    Current packs/day: 0.00    Types: Cigarettes    Quit date: 07/06/2015    Years since quitting: 7.5   Smokeless tobacco: Never  Vaping Use   Vaping status: Never Used  Substance Use Topics   Alcohol use: No    Alcohol/week: 0.0 standard drinks of alcohol   Drug use: Yes    Frequency: 1.0 times per week    Types: Marijuana     Allergies   Patient has no known allergies.   Review of Systems Review of Systems  Constitutional:  Positive for activity change. Negative for appetite change, fatigue and fever.  Gastrointestinal:  Negative for abdominal pain, diarrhea, nausea and vomiting.  Genitourinary:  Negative for dysuria,  flank pain, frequency, pelvic pain, urgency, vaginal bleeding, vaginal discharge and vaginal pain.  Musculoskeletal:  Negative for arthralgias and myalgias.     Physical Exam Triage Vital Signs ED Triage Vitals  Encounter Vitals Group     BP 01/16/23 1218 120/76     Systolic BP Percentile --      Diastolic BP Percentile --      Pulse Rate 01/16/23 1218 (!) 58     Resp 01/16/23 1218 14     Temp 01/16/23 1218 98.4 F (36.9 C)     Temp Source 01/16/23 1218 Oral     SpO2 01/16/23 1218 99 %     Weight --      Height --      Head Circumference --      Peak Flow --      Pain Score 01/16/23 1220 0     Pain Loc --      Pain Education --      Exclude from Growth Chart --    No data found.  Updated Vital Signs BP 120/76 (BP Location: Left Arm)   Pulse (!) 58   Temp 98.4 F (36.9 C) (Oral)   Resp 14   LMP 12/05/2022   SpO2 99%   Visual Acuity Right Eye Distance:   Left Eye Distance:   Bilateral Distance:    Right Eye Near:   Left Eye Near:    Bilateral Near:     Physical Exam Vitals reviewed.  Constitutional:      General: She is awake. She is not in acute distress.    Appearance: Normal appearance. She is well-developed. She is not ill-appearing.     Comments: Very pleasant female appears stated age in no acute distress sitting comfortably in exam room  HENT:     Head: Normocephalic and atraumatic.  Cardiovascular:     Rate and Rhythm: Normal rate and regular rhythm.     Heart sounds: Normal heart sounds, S1 normal and S2 normal. No murmur heard. Pulmonary:     Effort: Pulmonary effort is normal.     Breath sounds: Normal breath sounds. No wheezing, rhonchi or rales.     Comments: Clear to auscultation bilaterally Abdominal:     General: Bowel sounds are normal.     Palpations: Abdomen is soft.     Tenderness: There is no abdominal tenderness. There is no right CVA tenderness, left CVA tenderness, guarding or rebound.     Comments: Benign abdominal exam   Genitourinary:    Comments: Exam deferred Psychiatric:  Behavior: Behavior is cooperative.      UC Treatments / Results  Labs (all labs ordered are listed, but only abnormal results are displayed) Labs Reviewed  RPR  HIV ANTIBODY (ROUTINE TESTING W REFLEX)  POCT URINE PREGNANCY  CERVICOVAGINAL ANCILLARY ONLY    EKG   Radiology No results found.  Procedures Procedures (including critical care time)  Medications Ordered in UC Medications - No data to display  Initial Impression / Assessment and Plan / UC Course  I have reviewed the triage vital signs and the nursing notes.  Pertinent labs & imaging results that were available during my care of the patient were reviewed by me and considered in my medical decision making (see chart for details).     Patient is well-appearing, afebrile, nontoxic, nontachycardic.  Urine pregnancy was obtained and was negative.  Given her clinical presentation of vaginal odor concern for bacterial vaginosis.  Will start metronidazole 500 mg twice daily for 7 days.  We discussed that she is not to drink any alcohol on this medication due to associated Antabuse side effects.  Recommended hypoallergenic soaps and detergents and wear loosefitting cotton underwear.  STI swab was collected as well as blood testing for HIV and syphilis and these results are pending.  We will contact her if they are abnormal we will need to change treatment plan.  Discussed the importance of safe sex practices.  If she develops any worsening symptoms including abdominal pain, pelvic pain, fever, nausea, vomiting she needs to be seen immediately.  Strict return precautions given.  Work excuse note provided.  Final Clinical Impressions(s) / UC Diagnoses   Final diagnoses:  Vaginal odor  Screening examination for STI  Encounter for pregnancy test with result negative     Discharge Instructions      We are treating you for bacterial vaginosis.  Take  metronidazole twice daily for 7 days.  Do not drink any alcohol while on this medication as it will cause you to vomit.  We will contact you if any of your other testing is abnormal and we need to arrange additional treatment.  Abstain from sex until you receive the results.  Use a condom for the sexual encounter.  Use hypoallergenic soaps and detergents and wear loosefitting cotton underwear.  If you have any worsening or changing symptoms including abnormal discharge, pelvic pain, abdominal pain, fever, nausea, vomiting you should be reevaluated.     ED Prescriptions     Medication Sig Dispense Auth. Provider   metroNIDAZOLE (FLAGYL) 500 MG tablet Take 1 tablet (500 mg total) by mouth 2 (two) times daily. 14 tablet Yoali Conry, Noberto Retort, PA-C      PDMP not reviewed this encounter.   Jeani Hawking, PA-C 01/16/23 1317

## 2023-01-16 NOTE — ED Triage Notes (Signed)
Patient is requesting a pregnancy test and STI testing.  Patient states she has a vaginal odor only.

## 2023-01-17 ENCOUNTER — Telehealth (HOSPITAL_COMMUNITY): Payer: Self-pay | Admitting: Emergency Medicine

## 2023-01-17 LAB — CERVICOVAGINAL ANCILLARY ONLY
Bacterial Vaginitis (gardnerella): POSITIVE — AB
Candida Glabrata: NEGATIVE
Candida Vaginitis: NEGATIVE
Chlamydia: NEGATIVE
Comment: NEGATIVE
Comment: NEGATIVE
Comment: NEGATIVE
Comment: NEGATIVE
Comment: NEGATIVE
Comment: NORMAL
Neisseria Gonorrhea: NEGATIVE
Trichomonas: NEGATIVE

## 2023-01-17 NOTE — Telephone Encounter (Signed)
Patient called looking for HIv and Syphilis results.  States she had blood drawn, but labs were cancelled.  Informed her she will need to return for recollect, she verbalized understanding

## 2023-01-24 ENCOUNTER — Telehealth: Payer: Self-pay

## 2023-01-24 ENCOUNTER — Ambulatory Visit: Payer: Medicaid Other | Attending: Cardiovascular Disease | Admitting: Cardiovascular Disease

## 2023-01-24 ENCOUNTER — Encounter: Payer: Self-pay | Admitting: Cardiovascular Disease

## 2023-01-24 NOTE — Progress Notes (Deleted)
  Electrophysiology Office Note:    Date:  01/24/2023   ID:  Tanya Fisher, DOB 25-May-1996, MRN [id number]  PCP:  Patient, No Pcp Per   [facility] Providers Cardiologist:  None Electrophysiologist:  Maurice Small, MD     Referring MD: Melene Plan, DO   History of Present Illness:    Tanya Fisher is a 27 y.o. female with a hx listed below, significant for DVT during pregnancy, provoked PE referred for arrhythmia management.  She was admitted to the ER in May with severe bradycardia and a near syncopal episode. Heart rate at the time was often 30's-40's in sinus rhythm. A monitor was ordered, but she has not completed it.  She does have episodes of lightheadedness. No frank syncope. She has increased her fluid intake; drinking water.  Past Medical History:  Diagnosis Date   Asthma    DVT (deep vein thrombosis) in pregnancy    Gestational diabetes    Herpes    Hypertension     Past Surgical History:  Procedure Laterality Date   CESAREAN SECTION     CESAREAN SECTION N/A 04/24/2019   Procedure: CESAREAN SECTION;  Surgeon: Levie Heritage, DO;  Location: [facility];  Service: Obstetrics;  Laterality: N/A;   DILATION AND EVACUATION     Therapeutic abortion 14 wks    Current Medications: No outpatient medications have been marked as taking for the 01/24/23 encounter (Appointment) with , Roberts Gaudy, MD.     Allergies:   Patient has no known allergies.   Social and Family History: Reviewed in Epic  ROS:   Please see the history of present illness.    All other systems reviewed and are negative.  EKGs/Labs/Other Studies Reviewed Today:    Echocardiogram:  TTE 11/10/2022 EF 60-65%. Normal structure and function    Monitors:  Ordered - not completed Results pending  Stress testing:   Advanced imaging:   Cardiac catherization   EKG:  Last EKG results: today -- sinus bradycardia, sinus arrhythmia, HR 54 bpm   Recent Labs: 11/09/2022:  ALT 13; TSH 0.521 11/11/2022: Magnesium 1.8 12/10/2022: BUN 8; Creatinine, Ser 0.82; Hemoglobin 11.4; Platelets 231; Potassium 3.6; Sodium 138     Physical Exam:    VS:  There were no vitals taken for this visit.    Wt Readings from Last 3 Encounters:  12/19/22 157 lb (71.2 kg)  12/10/22 165 lb (74.8 kg)  12/03/22 167 lb 6.4 oz (75.9 kg)     GEN: Well nourished, well developed in no acute distress CARDIAC: RRR, no murmurs, rubs, gallops RESPIRATORY:  Normal work of breathing MUSCULOSKELETAL: no edema    ASSESSMENT & PLAN:    Sinus bradycardia Associated with lightheadedness, near syncope Waiting for her to complete her monitor to assess for chronotropic competence I do not think she would be a good candidate for pacemaker Encouraged exercise, may try to increase caffeine           Medication Adjustments/Labs and Tests Ordered: Current medicines are reviewed at length with the patient today.  Concerns regarding medicines are outlined above.  No orders of the defined types were placed in this encounter.  No orders of the defined types were placed in this encounter.    Signed, Maurice Small, MD  01/24/2023 3:16 PM     [facility]

## 2023-01-24 NOTE — Telephone Encounter (Signed)
Attempted to contact patient, need monitor results prior to visit - - she needs to mail her monitor back in. Unable to leave a voicemail on either phone number on file

## 2023-01-29 ENCOUNTER — Other Ambulatory Visit (HOSPITAL_COMMUNITY): Payer: Self-pay

## 2023-01-31 ENCOUNTER — Other Ambulatory Visit: Payer: Self-pay

## 2023-01-31 ENCOUNTER — Encounter: Payer: Self-pay | Admitting: Pharmacist

## 2023-02-03 ENCOUNTER — Other Ambulatory Visit: Payer: Self-pay

## 2023-03-12 ENCOUNTER — Other Ambulatory Visit (HOSPITAL_BASED_OUTPATIENT_CLINIC_OR_DEPARTMENT_OTHER): Payer: Self-pay

## 2023-04-13 ENCOUNTER — Ambulatory Visit
Admission: EM | Admit: 2023-04-13 | Discharge: 2023-04-13 | Disposition: A | Discharge status: Home / Self Care | Payer: Medicaid Other | Attending: Internal Medicine | Admitting: Internal Medicine

## 2023-04-13 DIAGNOSIS — Z113 Encounter for screening for infections with a predominantly sexual mode of transmission: Secondary | ICD-10-CM | POA: Insufficient documentation

## 2023-04-13 DIAGNOSIS — N760 Acute vaginitis: Secondary | ICD-10-CM | POA: Insufficient documentation

## 2023-04-13 LAB — POCT URINALYSIS DIP (MANUAL ENTRY)
Bilirubin, UA: NEGATIVE
Blood, UA: NEGATIVE
Glucose, UA: NEGATIVE mg/dL
Ketones, POC UA: NEGATIVE mg/dL
Leukocytes, UA: NEGATIVE
Nitrite, UA: NEGATIVE
Spec Grav, UA: 1.025 (ref 1.010–1.025)
Urobilinogen, UA: 1 U/dL
pH, UA: 6 (ref 5.0–8.0)

## 2023-04-13 LAB — POCT URINE PREGNANCY: Preg Test, Ur: NEGATIVE

## 2023-04-13 MED ORDER — METRONIDAZOLE 0.75 % EX GEL
1.0000 | Freq: Every evening | CUTANEOUS | 0 refills | Status: AC
Start: 2023-04-13 — End: 2023-04-20

## 2023-04-13 NOTE — Discharge Instructions (Signed)
The clinic will contact you with results of the testing done today if positive.  Start MetroGel vaginally every night for 7 nights.  Please follow-up with your PCP if your symptoms do not improve.  Please go to the ER for any worsening symptoms.  I hope you feel better soon!

## 2023-04-13 NOTE — ED Triage Notes (Signed)
Pt presents for STD and pregnancy testing.   Pt states she has vaginal odor, discharge and has not had a period yet.

## 2023-04-13 NOTE — ED Provider Notes (Signed)
[facility]    CSN: [id number] Arrival date & time: 04/13/23  1650      History   Chief Complaint No chief complaint on file.   HPI Tanya Fisher is a 27 y.o. female presents for vaginal discharge and STD screening.  Patient reports a week of a fishy smelling vaginal discharge that is consistent with previous BV infection she has had.  She denies any dysuria, fevers, nausea/vomiting, flank pain.  No known STD exposure but would like screening while here.  She would also like a pregnancy test.  She states her last period was on September 5.  She does not use birth control and is sexually active.  No other concerns at this time.  HPI  Past Medical History:  Diagnosis Date   Asthma    DVT (deep vein thrombosis) in pregnancy    Gestational diabetes    Herpes    Hypertension     Patient Active Problem List   Diagnosis Date Noted   Symptomatic bradycardia 11/10/2022   Essential hypertension 11/10/2022   Near syncope 11/09/2022   Pulmonary embolism (HCC) 08/04/2021   Recurrent HSV (herpes simplex virus) 10/11/2019   Vaginal itching 10/11/2019   Status post cesarean section 04/24/2019   History of cesarean delivery 04/09/2019   History of herpes simplex infection 02/28/2019   GDM (gestational diabetes mellitus) 02/28/2019   Trichomoniasis 01/29/2019   Substance abuse affecting pregnancy, 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   Supervision of high risk pregnancy, antepartum 04/29/2017   DVT (deep vein thrombosis) in pregnancy 04/29/2017   History of VBAC 04/29/2017    Past Surgical History:  Procedure Laterality Date   CESAREAN SECTION     CESAREAN SECTION N/A 04/24/2019   Procedure: CESAREAN SECTION;  Surgeon: Levie Heritage, DO;  Location: [facility] LD ORS;  Service: Obstetrics;  Laterality: N/A;   DILATION AND EVACUATION     Therapeutic abortion 14 wks    OB History     Gravida  4   Para  3   Term  3   Preterm  0    AB  1   Living  3      SAB  0   IAB  1   Ectopic  0   Multiple  0   Live Births  3            Home Medications    Prior to Admission medications   Medication Sig Start Date End Date Taking? Authorizing Provider  amLODipine (NORVASC) 5 MG tablet Take 1 tablet (5 mg total) by mouth daily. 11/11/22   Osvaldo Shipper, MD  cyanocobalamin 1000 MCG tablet Take 1 tablet (1,000 mcg total) by mouth daily. 11/11/22   Osvaldo Shipper, MD  ferrous sulfate 325 (65 FE) MG tablet Take 1 tablet (325 mg total) by mouth 2 (two) times daily with a meal. 11/12/22   Osvaldo Shipper, MD  folic acid (FOLVITE) 1 MG tablet Take 1 tablet (1 mg total) by mouth daily. 11/11/22   Osvaldo Shipper, MD  metroNIDAZOLE (FLAGYL) 500 MG tablet Take 1 tablet (500 mg total) by mouth 2 (two) times daily. 01/16/23   Raspet, Noberto Retort, PA-C    Family History Family History  Problem Relation Age of Onset   Hypertension Mother    Diabetes Father    Hypertension Father     Social History Social History   Tobacco Use   Smoking status: Former    Current  packs/day: 0.00    Types: Cigarettes    Quit date: 07/06/2015    Years since quitting: 7.7   Smokeless tobacco: Never  Vaping Use   Vaping status: Never Used  Substance Use Topics   Alcohol use: No    Alcohol/week: 0.0 standard drinks of alcohol   Drug use: Yes    Frequency: 1.0 times per week    Types: Marijuana     Allergies   Patient has no known allergies.   Review of Systems Review of Systems  Genitourinary:  Positive for vaginal discharge.     Physical Exam Triage Vital Signs ED Triage Vitals  Encounter Vitals Group     BP 04/13/23 1708 (!) 134/91     Systolic BP Percentile --      Diastolic BP Percentile --      Pulse Rate 04/13/23 1706 (!) 53     Resp 04/13/23 1706 17     Temp 04/13/23 1706 98.8 F (37.1 C)     Temp Source 04/13/23 1706 Oral     SpO2 04/13/23 1706 99 %     Weight --      Height --      Head Circumference --       Peak Flow --      Pain Score 04/13/23 1706 0     Pain Loc --      Pain Education --      Exclude from Growth Chart --    No data found.  Updated Vital Signs BP (!) 134/91   Pulse (!) 53   Temp 98.8 F (37.1 C) (Oral)   Resp 17   LMP 03/11/2023 (Exact Date)   SpO2 99%   Visual Acuity Right Eye Distance:   Left Eye Distance:   Bilateral Distance:    Right Eye Near:   Left Eye Near:    Bilateral Near:     Physical Exam Vitals and nursing note reviewed.  Constitutional:      Appearance: Normal appearance.  HENT:     Head: Normocephalic and atraumatic.  Eyes:     Pupils: Pupils are equal, round, and reactive to light.  Cardiovascular:     Rate and Rhythm: Normal rate.  Pulmonary:     Effort: Pulmonary effort is normal.  Abdominal:     Tenderness: There is no right CVA tenderness or left CVA tenderness.  Skin:    General: Skin is warm and dry.  Neurological:     General: No focal deficit present.     Mental Status: She is alert and oriented to person, place, and time.  Psychiatric:        Mood and Affect: Mood normal.        Behavior: Behavior normal.      UC Treatments / Results  Labs (all labs ordered are listed, but only abnormal results are displayed) Labs Reviewed  POCT URINALYSIS DIP (MANUAL ENTRY) - Abnormal; Notable for the following components:      Result Value   Protein Ur, POC trace (*)    All other components within normal limits  RPR  HIV ANTIBODY (ROUTINE TESTING W REFLEX)  POCT URINE PREGNANCY  CERVICOVAGINAL ANCILLARY ONLY    EKG   Radiology No results found.  Procedures Procedures (including critical care time)  Medications Ordered in UC Medications - No data to display  Initial Impression / Assessment and Plan / UC Course  I have reviewed the triage vital signs and the nursing notes.  Pertinent  labs & imaging results that were available during my care of the patient were reviewed by me and considered in my medical decision  making (see chart for details).     The patient.  No red flags.  UA negative for UTI.  hCG urine negative.  Vaginal swab/STD testing is ordered we will contact for any positive results.  This patient reports symptoms are consistent with BV will treat given concern for pregnancy will do vaginal inserts.  Patient to follow-up with PCP if symptoms do not improve.  ER precautions reviewed. Final Clinical Impressions(s) / UC Diagnoses   Final diagnoses:  Acute vaginitis  Screening examination for STD (sexually transmitted disease)   Discharge Instructions   None    ED Prescriptions   None    PDMP not reviewed this encounter.   Radford Pax, NP 04/13/23 1731

## 2023-04-14 LAB — CERVICOVAGINAL ANCILLARY ONLY
Bacterial Vaginitis (gardnerella): POSITIVE — AB
Candida Glabrata: NEGATIVE
Candida Vaginitis: NEGATIVE
Chlamydia: NEGATIVE
Comment: NEGATIVE
Comment: NEGATIVE
Comment: NEGATIVE
Comment: NEGATIVE
Comment: NEGATIVE
Comment: NORMAL
Neisseria Gonorrhea: NEGATIVE
Trichomonas: NEGATIVE

## 2023-04-14 LAB — RPR: RPR Ser Ql: NONREACTIVE

## 2023-04-14 LAB — HIV ANTIBODY (ROUTINE TESTING W REFLEX): HIV Screen 4th Generation wRfx: NONREACTIVE

## 2023-07-10 IMAGING — CT CT ANGIO CHEST
2 of 6 series · 16 of 46 positions shown · IV contrast (APPLIED)
Comparison: None.

CLINICAL DATA: Chest pain or SOB, pleurisy or effusion suspected;
chest pain; dvt history

EXAM:
CT ANGIOGRAPHY CHEST WITH CONTRAST
TECHNIQUE: Multidetector CT imaging of the chest was performed using the
standard protocol during bolus administration of intravenous
contrast. Multiplanar CT image reconstructions and MIPs were
obtained to evaluate the vascular anatomy.

[Series 8: thins · axial · 0.75mm/px · z∈[-129,+77]mm · 13 of 325 slices shown]
[im 15/325  lung]
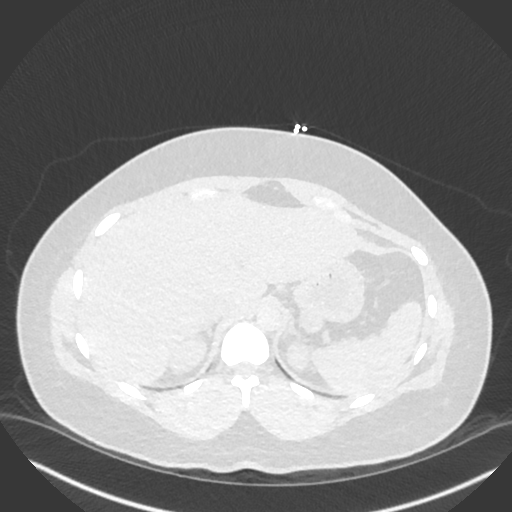
[im 43/325  soft-tissue]
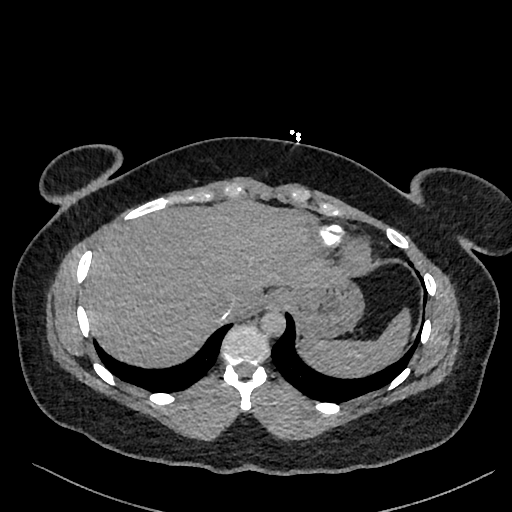
[im 71/325  lung]
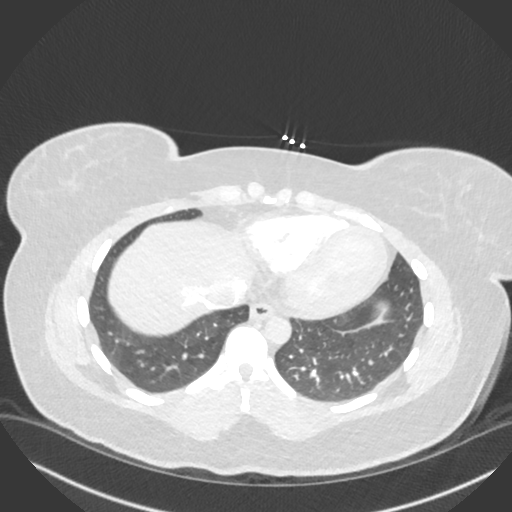
[im 85/325  soft-tissue]
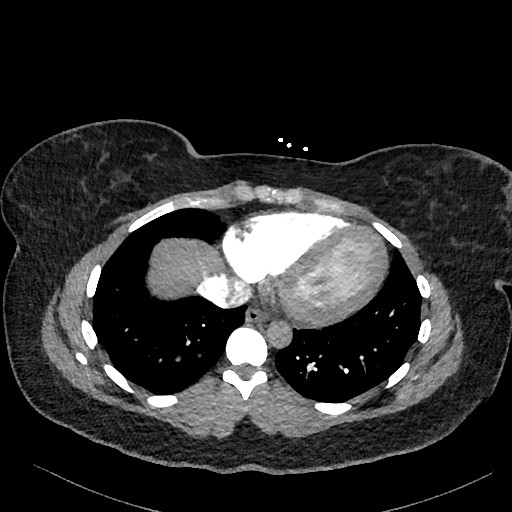
[im 113/325  lung]
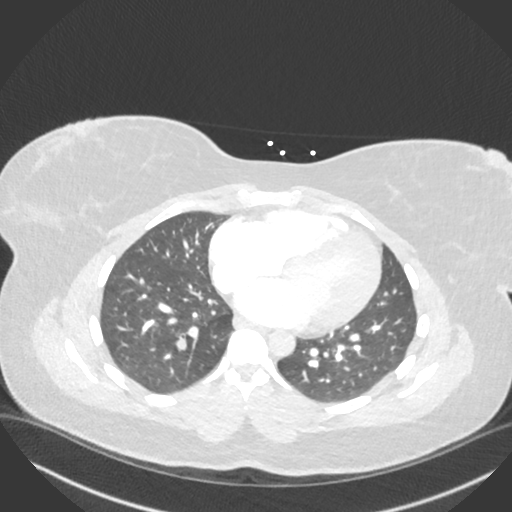
[im 141/325  soft-tissue]
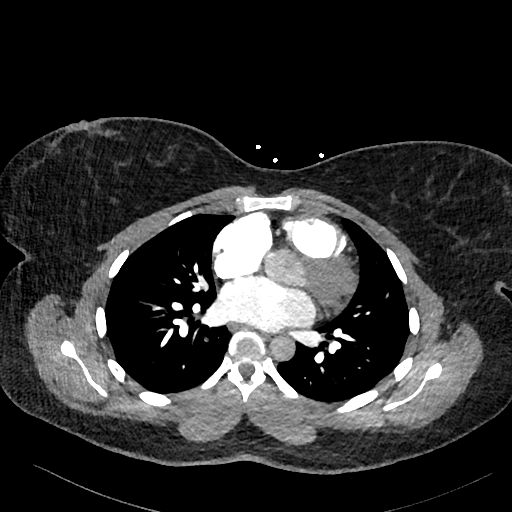
[im 170/325  lung]
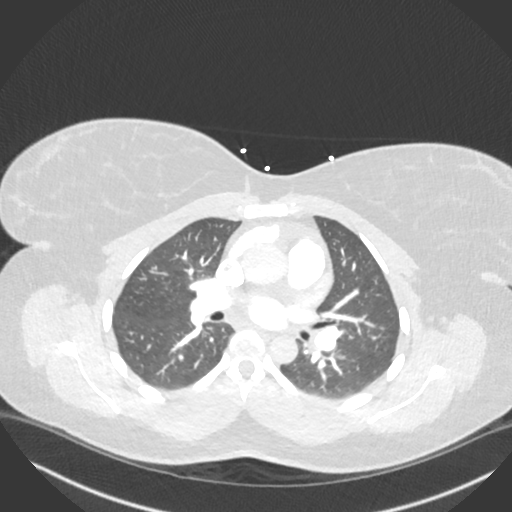
[im 184/325  soft-tissue]
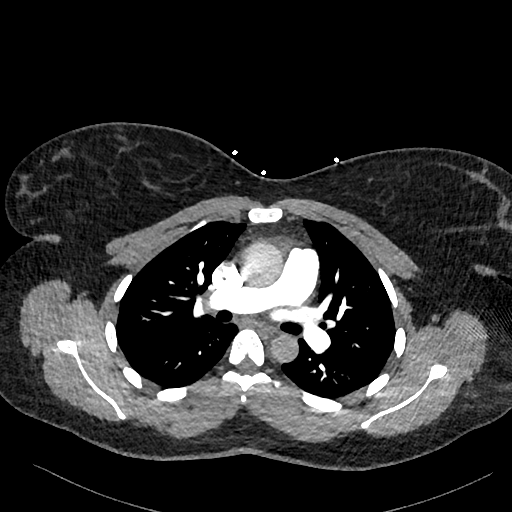
[im 212/325  lung]
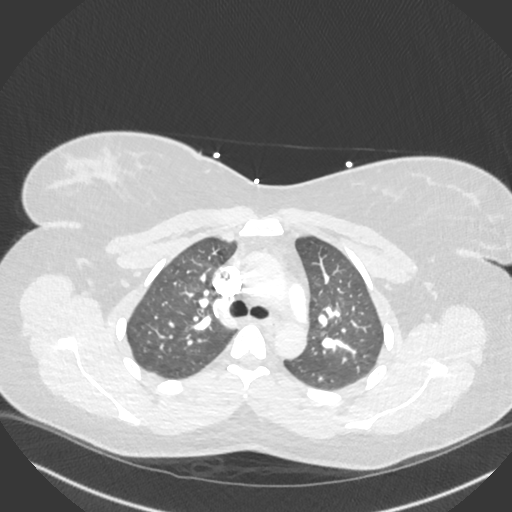
[im 240/325  soft-tissue]
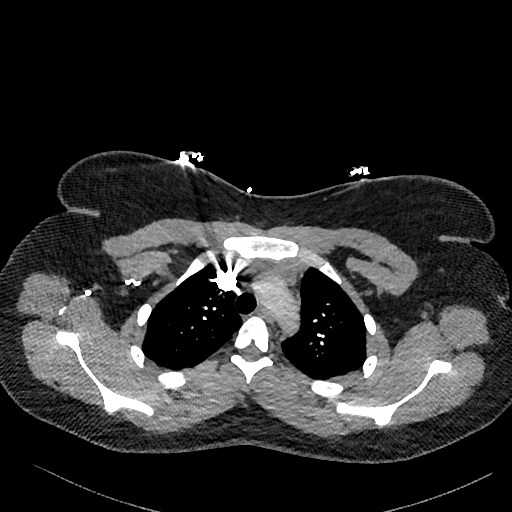
[im 254/325  lung]
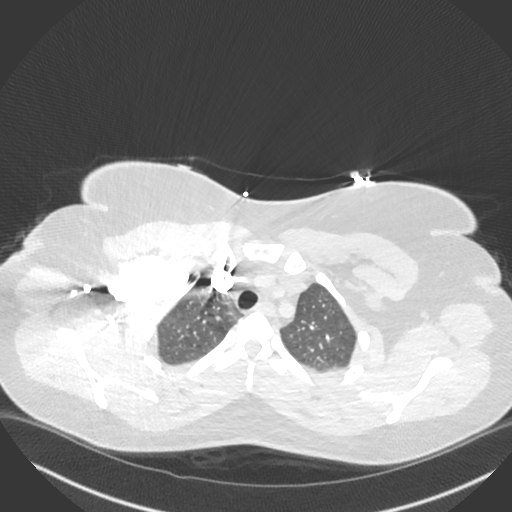
[im 282/325  soft-tissue]
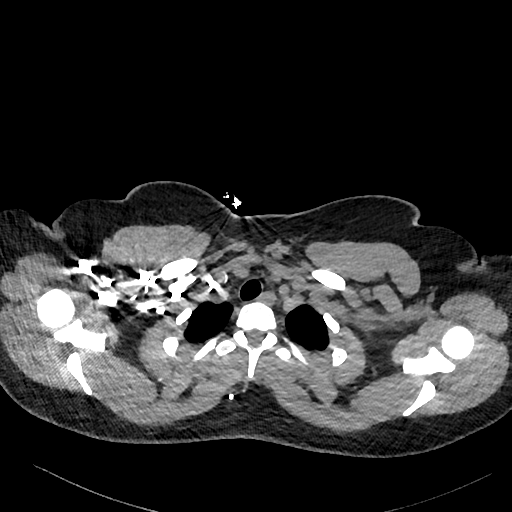
[im 310/325  lung]
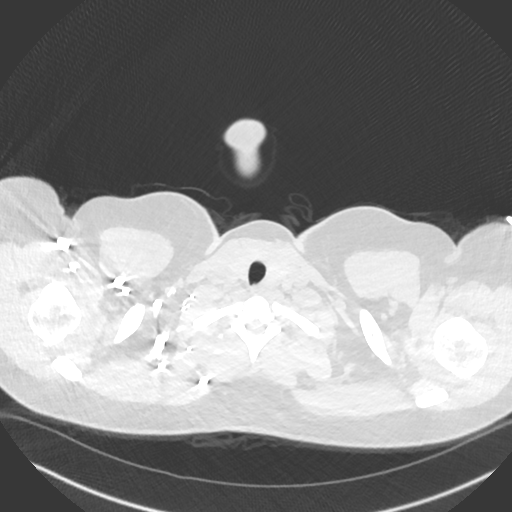

[Series 9: cor · coronal · 0.44mm/px · 3 of 139 slices shown]
[im 35/139  soft-tissue]
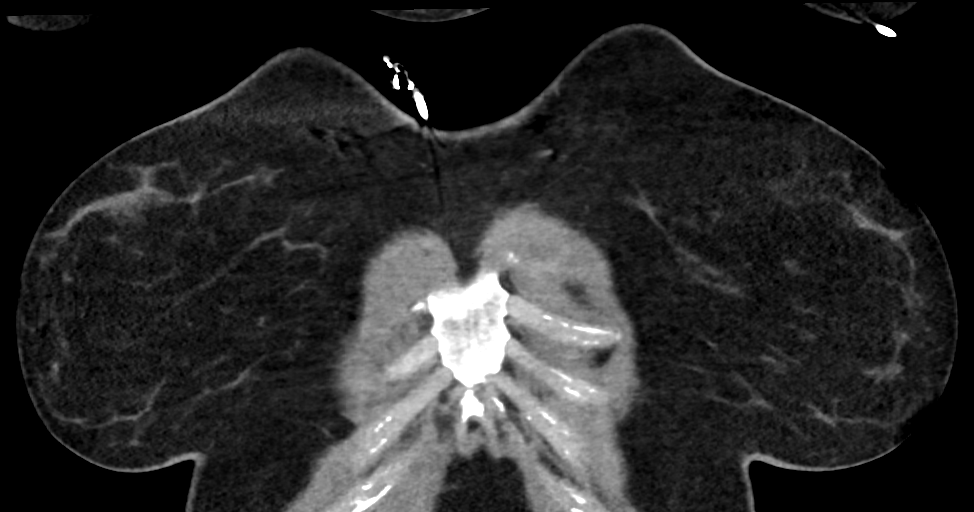
[im 70/139  soft-tissue]
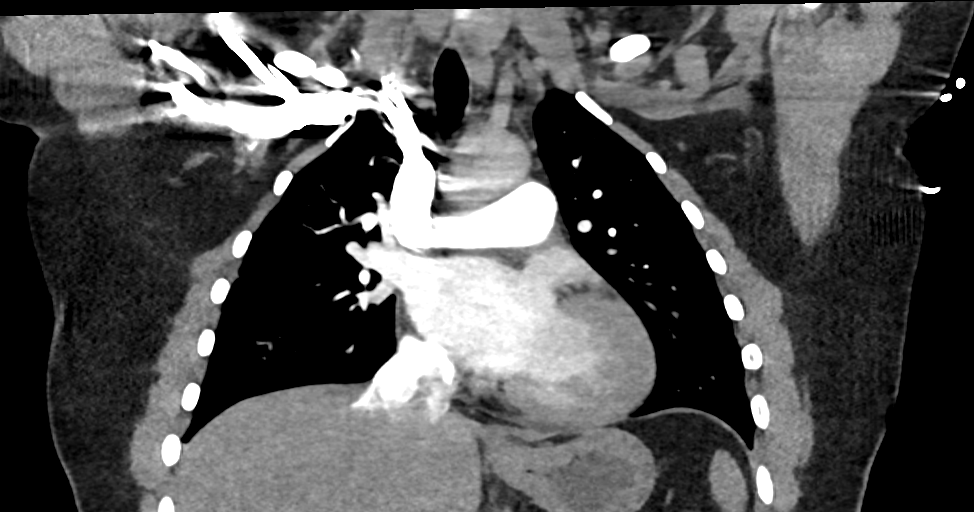
[im 104/139  soft-tissue]
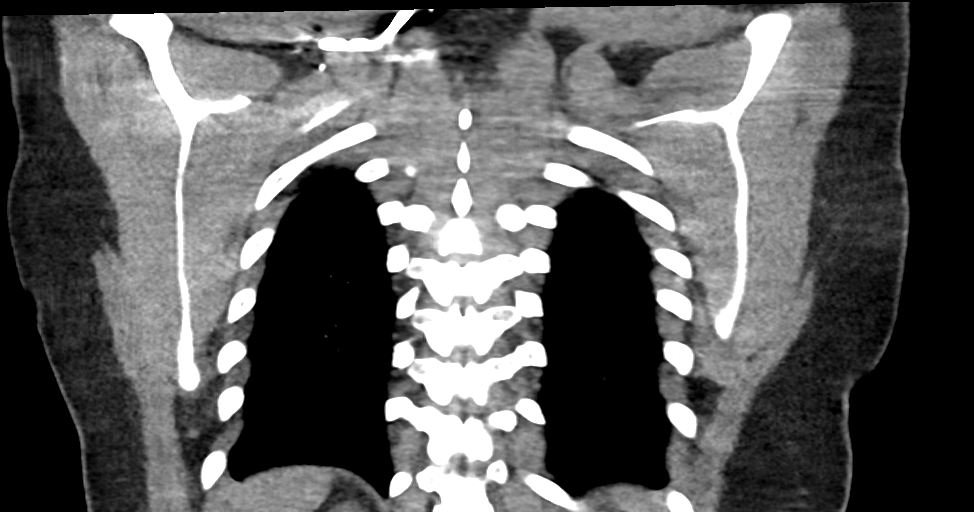

[16 of 46 positions shown; findings below may reference images not displayed]

RADIATION DOSE REDUCTION: This exam was performed according to the
departmental dose-optimization program which includes automated
exposure control, adjustment of the mA and/or kV according to
patient size and/or use of iterative reconstruction technique.

CONTRAST:  50mL OMNIPAQUE IOHEXOL 350 MG/ML SOLN
FINDINGS: Cardiovascular: Satisfactory opacification of the pulmonary arteries
to the segmental level. Acute segmental pulmonary emboli are present
in the right lower lobe. There is also some involvement of the
lingula. Normal heart size. No pericardial effusion.

Mediastinum/Nodes: No enlarged nodes. Included thyroid is
unremarkable. Esophagus is unremarkable.

Lungs/Pleura: No consolidation or mass. No pleural effusion or
pneumothorax.

Upper Abdomen: No acute abnormality.

Musculoskeletal: No acute osseous abnormality.

Review of the MIP images confirms the above findings.
IMPRESSION: Acute right lower lobe segmental pulmonary emboli. Some segmental
involvement of the lingula as well.

These results were called by telephone at the time of interpretation
on 08/04/2021 at [DATE].

## 2023-07-10 IMAGING — CR DG CHEST 2V
2 series · 2 of 2 positions shown · non-contrast
Comparison: None.

CLINICAL DATA: Chest pain

EXAM:
CHEST - 2 VIEW

[chest pa]
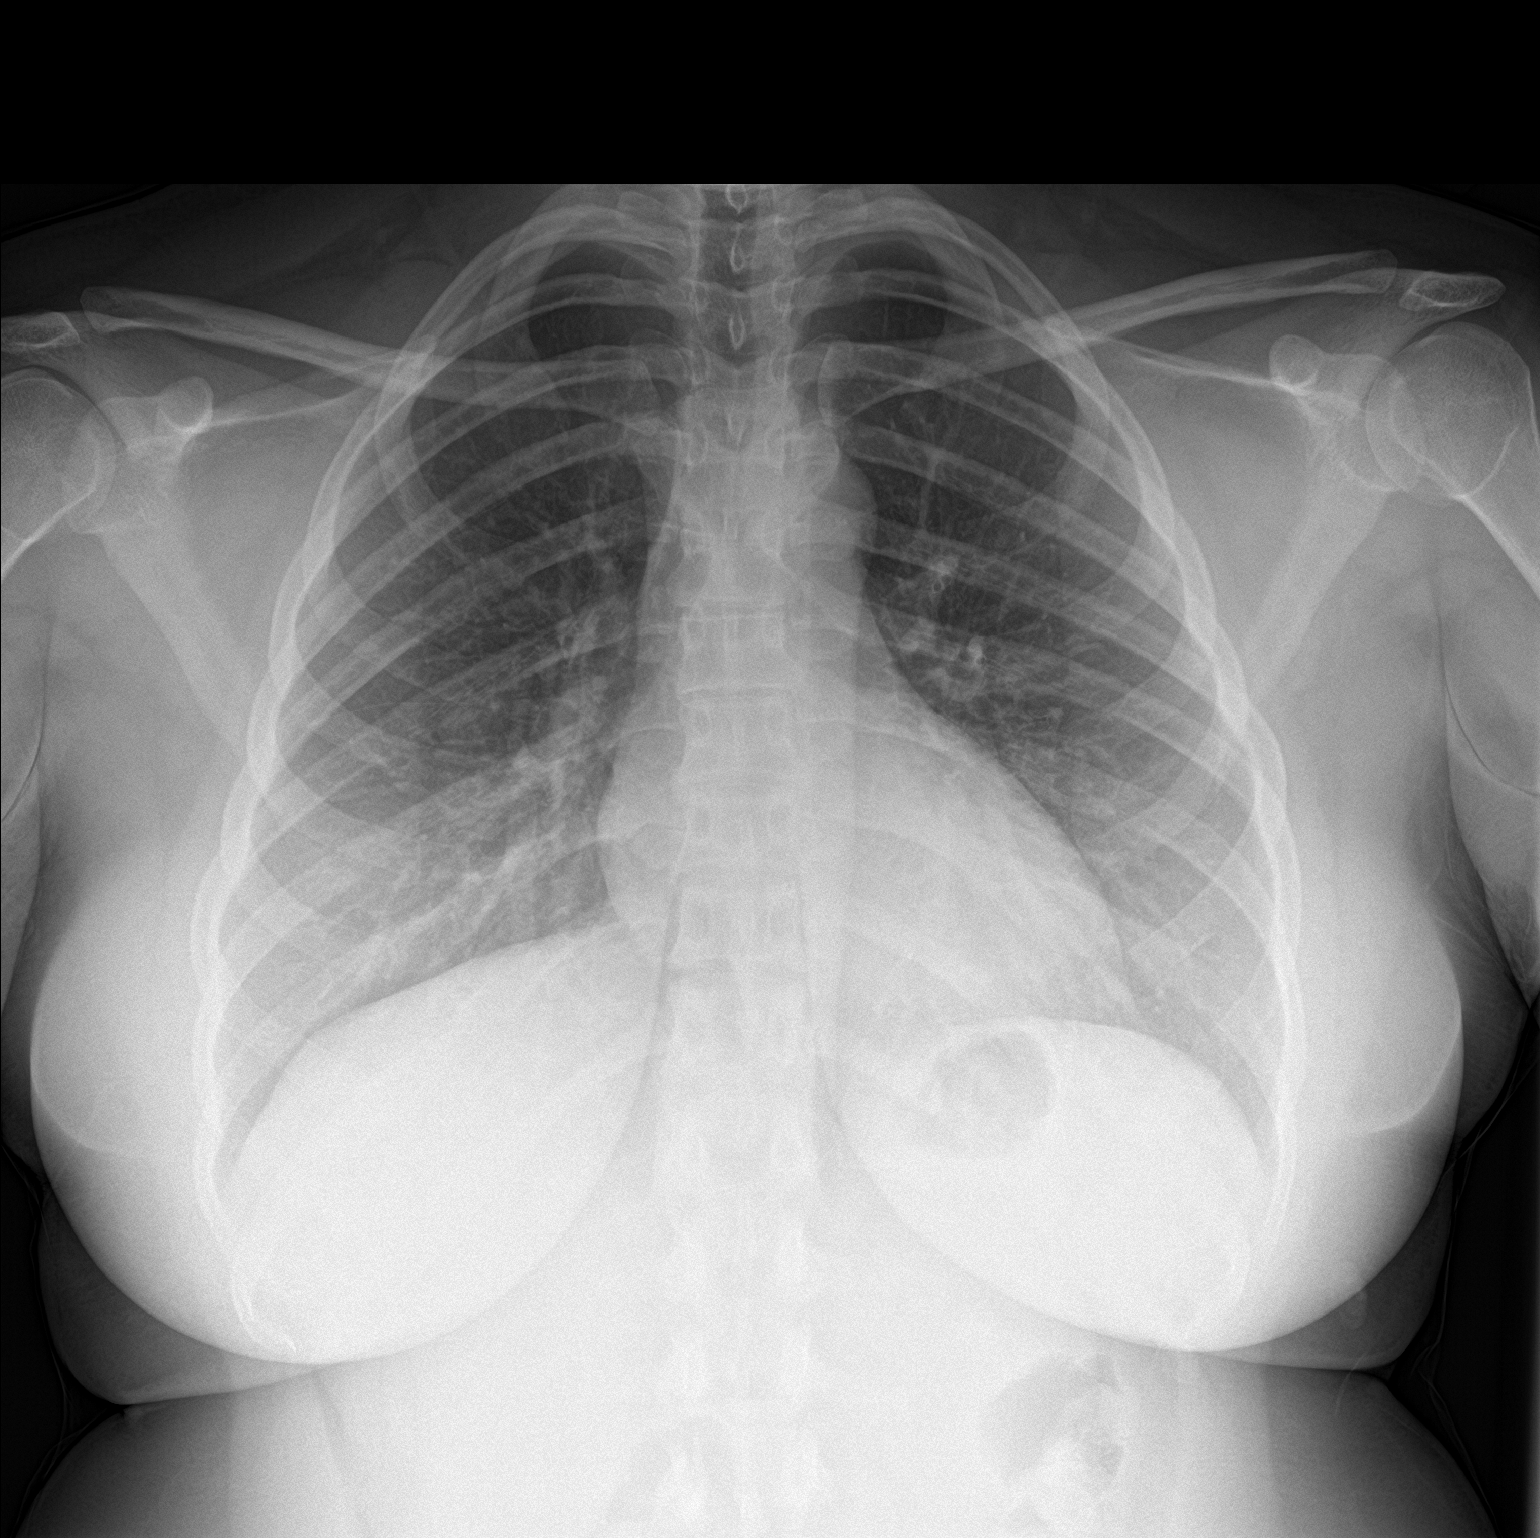

[chest lat]
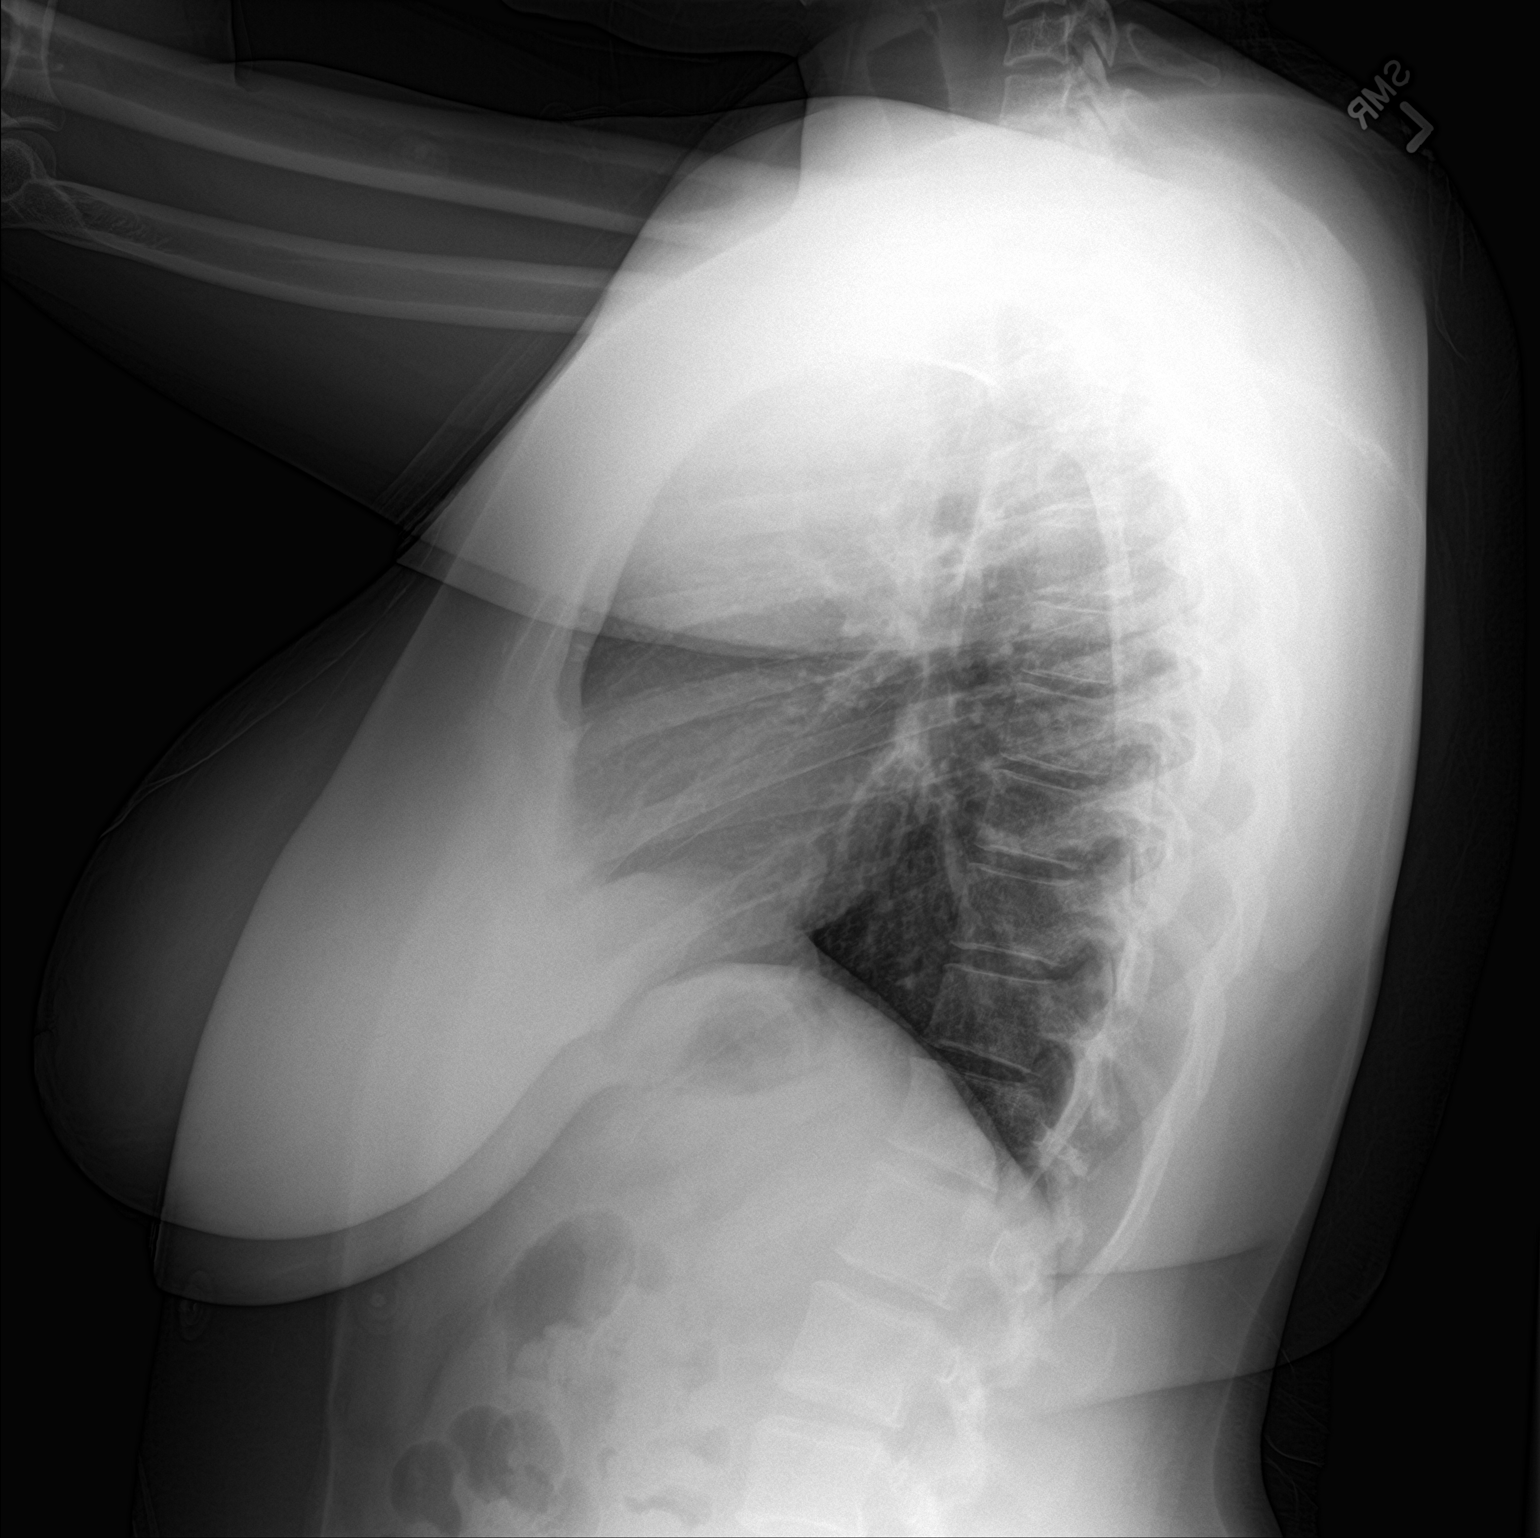

[2 of 2 positions shown; findings below may reference images not displayed]

FINDINGS: The heart size and mediastinal contours are within normal limits.
Both lungs are clear. The visualized skeletal structures are
unremarkable.
IMPRESSION: No evidence of acute cardiopulmonary disease.

## 2023-11-09 ENCOUNTER — Ambulatory Visit: Admitting: *Deleted

## 2023-11-09 ENCOUNTER — Other Ambulatory Visit: Payer: Self-pay

## 2023-11-09 ENCOUNTER — Other Ambulatory Visit (HOSPITAL_COMMUNITY)
Admission: RE | Admit: 2023-11-09 | Discharge: 2023-11-09 | Disposition: A | Discharge status: Home / Self Care | Source: Ambulatory Visit | Attending: Family Medicine | Admitting: Family Medicine

## 2023-11-09 VITALS — BP 142/95 | HR 51 | Ht 63.0 in | Wt 154.2 lb

## 2023-11-09 DIAGNOSIS — Z9189 Other specified personal risk factors, not elsewhere classified: Secondary | ICD-10-CM | POA: Diagnosis present

## 2023-11-09 DIAGNOSIS — N898 Other specified noninflammatory disorders of vagina: Secondary | ICD-10-CM

## 2023-11-09 DIAGNOSIS — R030 Elevated blood-pressure reading, without diagnosis of hypertension: Secondary | ICD-10-CM

## 2023-11-09 NOTE — Progress Notes (Signed)
 Pt presents with request for STI testing due to unprotected sex.  She also reports vaginal itching x1 week. Self swab was obtained and lab draw for rpr and HIV per pt request. Pt advised she will be notified of results and any treatment prescribed via Mychart. Pt was noted to have elevated blood pressure today. She denied having headaches, blurry vision or dizziness. Pt reported that she was in hospital one year ago and had high blood pressure @ that time. She was prescribed Amlodipine  and took it for 2 months, then stopped because she felt good. Pt does not have PCP. Pt was informed of importance of proper follow up of her BP and referral was placed to [facility].  She voiced understanding of all information and instructions given.

## 2023-11-11 ENCOUNTER — Emergency Department (HOSPITAL_COMMUNITY)
Admission: EM | Admit: 2023-11-11 | Discharge: 2023-11-11 | Disposition: A | Discharge status: Home / Self Care | Attending: Emergency Medicine | Admitting: Emergency Medicine

## 2023-11-11 ENCOUNTER — Emergency Department (HOSPITAL_COMMUNITY)

## 2023-11-11 ENCOUNTER — Other Ambulatory Visit: Payer: Self-pay

## 2023-11-11 ENCOUNTER — Encounter (HOSPITAL_COMMUNITY): Payer: Self-pay | Admitting: Emergency Medicine

## 2023-11-11 DIAGNOSIS — J45909 Unspecified asthma, uncomplicated: Secondary | ICD-10-CM | POA: Diagnosis not present

## 2023-11-11 DIAGNOSIS — N3000 Acute cystitis without hematuria: Secondary | ICD-10-CM

## 2023-11-11 DIAGNOSIS — R1031 Right lower quadrant pain: Secondary | ICD-10-CM | POA: Diagnosis present

## 2023-11-11 DIAGNOSIS — I10 Essential (primary) hypertension: Secondary | ICD-10-CM | POA: Insufficient documentation

## 2023-11-11 DIAGNOSIS — Z79899 Other long term (current) drug therapy: Secondary | ICD-10-CM | POA: Diagnosis not present

## 2023-11-11 DIAGNOSIS — K529 Noninfective gastroenteritis and colitis, unspecified: Secondary | ICD-10-CM | POA: Diagnosis not present

## 2023-11-11 LAB — URINALYSIS, ROUTINE W REFLEX MICROSCOPIC
Bilirubin Urine: NEGATIVE
Glucose, UA: NEGATIVE mg/dL
Hgb urine dipstick: NEGATIVE
Ketones, ur: 5 mg/dL — AB
Nitrite: NEGATIVE
Protein, ur: NEGATIVE mg/dL
Specific Gravity, Urine: 1.011 (ref 1.005–1.030)
pH: 6 (ref 5.0–8.0)

## 2023-11-11 LAB — COMPREHENSIVE METABOLIC PANEL WITH GFR
ALT: 15 U/L (ref 0–44)
AST: 17 U/L (ref 15–41)
Albumin: 3.5 g/dL (ref 3.5–5.0)
Alkaline Phosphatase: 69 U/L (ref 38–126)
Anion gap: 13 (ref 5–15)
BUN: 8 mg/dL (ref 6–20)
CO2: 26 mmol/L (ref 22–32)
Calcium: 9.2 mg/dL (ref 8.9–10.3)
Chloride: 101 mmol/L (ref 98–111)
Creatinine, Ser: 0.91 mg/dL (ref 0.44–1.00)
GFR, Estimated: >60 mL/min (ref >60)
Glucose, Bld: 158 mg/dL — ABNORMAL HIGH (ref 70–99)
Potassium: 3.4 mmol/L — ABNORMAL LOW (ref 3.5–5.1)
Sodium: 140 mmol/L (ref 135–145)
Total Bilirubin: 1.3 mg/dL — ABNORMAL HIGH (ref 0.0–1.2)
Total Protein: 7.5 g/dL (ref 6.5–8.1)

## 2023-11-11 LAB — CBC
HCT: 38.1 % (ref 36.0–46.0)
Hemoglobin: 12.4 g/dL (ref 12.0–15.0)
MCH: 29.7 pg (ref 26.0–34.0)
MCHC: 32.5 g/dL (ref 30.0–36.0)
MCV: 91.1 fL (ref 80.0–100.0)
Platelets: 278 10*3/uL (ref 150–400)
RBC: 4.18 MIL/uL (ref 3.87–5.11)
RDW: 14.4 % (ref 11.5–15.5)
WBC: 10.6 10*3/uL — ABNORMAL HIGH (ref 4.0–10.5)
nRBC: 0 % (ref 0.0–0.2)

## 2023-11-11 LAB — LIPASE, BLOOD: Lipase: 27 U/L (ref 11–51)

## 2023-11-11 LAB — CERVICOVAGINAL ANCILLARY ONLY
Bacterial Vaginitis (gardnerella): POSITIVE — AB
Candida Glabrata: NEGATIVE
Candida Vaginitis: NEGATIVE
Chlamydia: NEGATIVE
Comment: NEGATIVE
Comment: NEGATIVE
Comment: NEGATIVE
Comment: NEGATIVE
Comment: NEGATIVE
Comment: NORMAL
Neisseria Gonorrhea: POSITIVE — AB
Trichomonas: NEGATIVE

## 2023-11-11 LAB — RPR: RPR Ser Ql: NONREACTIVE

## 2023-11-11 LAB — HIV ANTIBODY (ROUTINE TESTING W REFLEX): HIV Screen 4th Generation wRfx: NONREACTIVE

## 2023-11-11 LAB — HCG, SERUM, QUALITATIVE: Preg, Serum: NEGATIVE

## 2023-11-11 MED ORDER — DIPHENHYDRAMINE HCL 25 MG PO CAPS: 50.0000 mg | ORAL_CAPSULE | Freq: Once | ORAL | Status: AC

## 2023-11-11 MED ORDER — MORPHINE SULFATE (PF) 4 MG/ML IV SOLN
4.0000 mg | Freq: Once | INTRAVENOUS | Status: AC
  Administered 2023-11-11: 4 mg via INTRAVENOUS
  Filled 2023-11-11: qty 1

## 2023-11-11 MED ORDER — AMOXICILLIN-POT CLAVULANATE 875-125 MG PO TABS: 1.0000 | ORAL_TABLET | Freq: Two times a day (BID) | ORAL | 0 refills | Status: DC

## 2023-11-11 MED ORDER — DIPHENHYDRAMINE HCL 50 MG/ML IJ SOLN
50.0000 mg | Freq: Once | INTRAMUSCULAR | Status: AC
  Administered 2023-11-11: 50 mg via INTRAVENOUS
  Filled 2023-11-11: qty 1

## 2023-11-11 MED ORDER — FENTANYL CITRATE PF 50 MCG/ML IJ SOSY
25.0000 ug | PREFILLED_SYRINGE | Freq: Once | INTRAMUSCULAR | Status: AC
  Administered 2023-11-11: 25 ug via INTRAVENOUS
  Filled 2023-11-11: qty 1

## 2023-11-11 MED ORDER — ONDANSETRON HCL 4 MG/2ML IJ SOLN
4.0000 mg | Freq: Once | INTRAMUSCULAR | Status: AC
  Administered 2023-11-11: 4 mg via INTRAVENOUS
  Filled 2023-11-11: qty 2

## 2023-11-11 MED ORDER — LACTATED RINGERS IV BOLUS
1000.0000 mL | Freq: Once | INTRAVENOUS | Status: AC
  Administered 2023-11-11: 1000 mL via INTRAVENOUS

## 2023-11-11 MED ORDER — ONDANSETRON 4 MG PO TBDP: 4.0000 mg | ORAL_TABLET | Freq: Three times a day (TID) | ORAL | 0 refills | Status: DC | PRN

## 2023-11-11 MED ORDER — METHYLPREDNISOLONE SODIUM SUCC 40 MG IJ SOLR
40.0000 mg | Freq: Once | INTRAMUSCULAR | Status: AC
  Administered 2023-11-11: 40 mg via INTRAVENOUS
  Filled 2023-11-11: qty 1

## 2023-11-11 MED ORDER — IOHEXOL 350 MG/ML SOLN
75.0000 mL | Freq: Once | INTRAVENOUS | Status: AC | PRN
  Administered 2023-11-11: 75 mL via INTRAVENOUS

## 2023-11-11 MED ORDER — DICYCLOMINE HCL 20 MG PO TABS: 20.0000 mg | ORAL_TABLET | Freq: Two times a day (BID) | ORAL | 0 refills | Status: DC

## 2023-11-11 NOTE — ED Provider Notes (Signed)
 [facility] Provider Note   CSN: [id number] Arrival date & time: 11/11/23  1052     History  Chief Complaint  Patient presents with   Abdominal Pain    Tanya Fisher is a 28 y.o. female.  28 year old female presents today for concern of abdominal pain that started yesterday along with diarrhea.  No nausea or vomiting.  Denies any urinary complaints.  Endorses history of C-section otherwise no history of abdominal surgeries.  The history is provided by the patient. No language interpreter was used.       Home Medications Prior to Admission medications   Medication Sig Start Date End Date Taking? Authorizing Provider  amoxicillin-clavulanate (AUGMENTIN) 875-125 MG tablet Take 1 tablet by mouth every 12 (twelve) hours. 11/11/23  Yes Londyn Hotard, PA-C  dicyclomine (BENTYL) 20 MG tablet Take 1 tablet (20 mg total) by mouth 2 (two) times daily. 11/11/23  Yes Lucina Sabal, PA-C  ferrous sulfate  325 (65 FE) MG tablet Take 1 tablet (325 mg total) by mouth 2 (two) times daily with a meal. 11/12/22  Yes Maylene Spear, MD  MAGNESIUM  PO Take 1 tablet by mouth daily.   Yes [provider]  ondansetron  (ZOFRAN -ODT) 4 MG disintegrating tablet Take 1 tablet (4 mg total) by mouth every 8 (eight) hours as needed. 11/11/23  Yes Skila Rollins, PA-C  amLODipine  (NORVASC ) 5 MG tablet Take 1 tablet (5 mg total) by mouth daily. Patient not taking: Reported on 11/09/2023 11/11/22   Krishnan, Gokul, MD  cyanocobalamin  1000 MCG tablet Take 1 tablet (1,000 mcg total) by mouth daily. Patient not taking: Reported on 11/11/2023 11/11/22   Krishnan, Gokul, MD  folic acid  (FOLVITE ) 1 MG tablet Take 1 tablet (1 mg total) by mouth daily. Patient not taking: Reported on 11/09/2023 11/11/22   Krishnan, Gokul, MD      Allergies    Ivp dye [iodinated contrast media]    Review of Systems   Review of Systems  Constitutional:  Negative for chills and fever.  Respiratory:  Negative  for shortness of breath.   Cardiovascular:  Negative for chest pain.  Gastrointestinal:  Positive for abdominal pain and diarrhea. Negative for nausea and vomiting.  Genitourinary:  Negative for dysuria.  All other systems reviewed and are negative.   Physical Exam Updated Vital Signs BP 126/88 (BP Location: Left Arm)   Pulse 76   Temp 98.3 F (36.8 C) (Oral)   Resp 18   Ht 5\' 3"  (1.6 m)   Wt 69 kg   LMP 11/02/2023 (Exact Date)   SpO2 98%   BMI 26.95 kg/m  Physical Exam Vitals and nursing note reviewed.  Constitutional:      General: She is not in acute distress.    Appearance: Normal appearance. She is not ill-appearing.  HENT:     Head: Normocephalic and atraumatic.     Nose: Nose normal.  Eyes:     General: No scleral icterus.    Extraocular Movements: Extraocular movements intact.     Conjunctiva/sclera: Conjunctivae normal.  Cardiovascular:     Rate and Rhythm: Normal rate and regular rhythm.  Pulmonary:     Effort: Pulmonary effort is normal. No respiratory distress.  Abdominal:     General: There is no distension.     Tenderness: There is abdominal tenderness. There is no right CVA tenderness, left CVA tenderness or guarding.  Musculoskeletal:        General: Normal range of motion.  Cervical back: Normal range of motion.  Skin:    General: Skin is warm and dry.  Neurological:     General: No focal deficit present.     Mental Status: She is alert. Mental status is at baseline.     ED Results / Procedures / Treatments   Labs (all labs ordered are listed, but only abnormal results are displayed) Labs Reviewed  COMPREHENSIVE METABOLIC PANEL WITH GFR - Abnormal; Notable for the following components:      Result Value   Potassium 3.4 (*)    Glucose, Bld 158 (*)    Total Bilirubin 1.3 (*)    All other components within normal limits  CBC - Abnormal; Notable for the following components:   WBC 10.6 (*)    All other components within normal limits   URINALYSIS, ROUTINE W REFLEX MICROSCOPIC - Abnormal; Notable for the following components:   APPearance HAZY (*)    Ketones, ur 5 (*)    Leukocytes,Ua LARGE (*)    Bacteria, UA RARE (*)    All other components within normal limits  LIPASE, BLOOD  HCG, SERUM, QUALITATIVE    EKG None  Radiology CT ABDOMEN PELVIS W CONTRAST Result Date: 11/11/2023 CLINICAL DATA:  Acute abdominal pain. EXAM: CT ABDOMEN AND PELVIS WITH CONTRAST TECHNIQUE: Multidetector CT imaging of the abdomen and pelvis was performed using the standard protocol following bolus administration of intravenous contrast. RADIATION DOSE REDUCTION: This exam was performed according to the departmental dose-optimization program which includes automated exposure control, adjustment of the mA and/or kV according to patient size and/or use of iterative reconstruction technique. CONTRAST:  75mL OMNIPAQUE  IOHEXOL  350 MG/ML SOLN COMPARISON:  None Available. FINDINGS: Lower chest: Clear lung bases. Hepatobiliary: No focal liver abnormality is seen. No gallstones, gallbladder wall thickening, or biliary dilatation. Pancreas: No ductal dilatation or inflammation. Spleen: Normal in size without focal abnormality. Incidental splenule inferiorly. Adrenals/Urinary Tract: Normal adrenal glands. No hydronephrosis, renal calculi or renal inflammation. The urinary bladder is minimally distended. Stomach/Bowel: Bowel assessment is limited in the absence of enteric contrast. The stomach is nondistended. Few fluid-filled loops of small bowel in the pelvis without wall thickening or abnormal enhancement. No obstruction. Normal appendix, for example series 6, image 49. Small volume of formed stool in the colon. Vascular/Lymphatic: No acute vascular findings. Normal caliber abdominal aorta. Patent portal vein. No abdominopelvic adenopathy. Reproductive: Heterogeneous uterus. 2.2 cm left adnexal cyst. No specific imaging follow-up is needed. Other: Small amount of  free fluid in the pelvis. No free air. No abdominal wall hernia. Musculoskeletal: There are no acute or suspicious osseous abnormalities. IMPRESSION: 1. Few fluid-filled loops of small bowel in the pelvis without wall thickening or abnormal enhancement, can be seen with enteritis. 2. Small amount of free fluid in the pelvis, typically physiologic in a patient of this age. Electronically Signed   By: Chadwick Colonel M.D.   On: 11/11/2023 19:35    Procedures Procedures    Medications Ordered in ED Medications  lactated ringers  bolus 1,000 mL (0 mLs Intravenous Stopped 11/11/23 1940)  methylPREDNISolone sodium succinate (SOLU-MEDROL) 40 mg/mL injection 40 mg (40 mg Intravenous Given 11/11/23 1440)  diphenhydrAMINE  (BENADRYL ) capsule 50 mg ( Oral See Alternative 11/11/23 1755)    Or  diphenhydrAMINE  (BENADRYL ) injection 50 mg (50 mg Intravenous Given 11/11/23 1755)  ondansetron  (ZOFRAN ) injection 4 mg (4 mg Intravenous Given 11/11/23 1441)  fentaNYL  (SUBLIMAZE ) injection 25 mcg (25 mcg Intravenous Given 11/11/23 1442)  iohexol  (OMNIPAQUE ) 350 MG/ML injection 75  mL (75 mLs Intravenous Contrast Given 11/11/23 1818)  morphine  (PF) 4 MG/ML injection 4 mg (4 mg Intravenous Given 11/11/23 1940)  ondansetron  (ZOFRAN ) injection 4 mg (4 mg Intravenous Given 11/11/23 1940)    ED Course/ Medical Decision Making/ A&P                                 Medical Decision Making Amount and/or Complexity of Data Reviewed Labs: ordered. Radiology: ordered.  Risk Prescription drug management.   Medical Decision Making / ED Course   This patient presents to the ED for concern of abdominal pain, this involves an extensive number of treatment options, and is a complaint that carries with it a high risk of complications and morbidity.  The differential diagnosis includes UTI, pyelonephritis, gastroenteritis, gastritis, colitis, diverticulitis, pancreatitis  MDM: 28 year old female presents with abdominal pain.  Does have  some right lower quadrant abdominal tenderness.  Hemodynamically stable.  Will obtain labs and imaging.  UA with some evidence of UTI.  Otherwise workup overall reassuring.  CT with evidence of enteritis otherwise no other acute finding.  Discharged in stable condition.  Return precaution discussed.  Augmentin prescribed for both UTI and enteritis.    Lab Tests: -I ordered, reviewed, and interpreted labs.   The pertinent results include:   Labs Reviewed  COMPREHENSIVE METABOLIC PANEL WITH GFR - Abnormal; Notable for the following components:      Result Value   Potassium 3.4 (*)    Glucose, Bld 158 (*)    Total Bilirubin 1.3 (*)    All other components within normal limits  CBC - Abnormal; Notable for the following components:   WBC 10.6 (*)    All other components within normal limits  URINALYSIS, ROUTINE W REFLEX MICROSCOPIC - Abnormal; Notable for the following components:   APPearance HAZY (*)    Ketones, ur 5 (*)    Leukocytes,Ua LARGE (*)    Bacteria, UA RARE (*)    All other components within normal limits  LIPASE, BLOOD  HCG, SERUM, QUALITATIVE      EKG  EKG Interpretation Date/Time:    Ventricular Rate:    PR Interval:    QRS Duration:    QT Interval:    QTC Calculation:   R Axis:      Text Interpretation:           Imaging Studies ordered: I ordered imaging studies including CT abdomen pelvis with contrast I independently visualized and interpreted imaging. I agree with the radiologist interpretation   Medicines ordered and prescription drug management: Meds ordered this encounter  Medications   lactated ringers  bolus 1,000 mL   methylPREDNISolone sodium succinate (SOLU-MEDROL) 40 mg/mL injection 40 mg    IV methylprednisolone will be converted to either a q12h or q24h frequency with the same total daily dose (TDD).  Ordered Dose: 1 to 125 mg TDD; convert to: TDD q24h.  Ordered Dose: 126 to 250 mg TDD; convert to: TDD div q12h.  Ordered  Dose: >250 mg TDD; DAW.   OR Linked Order Group    diphenhydrAMINE  (BENADRYL ) capsule 50 mg    diphenhydrAMINE  (BENADRYL ) injection 50 mg   ondansetron  (ZOFRAN ) injection 4 mg   fentaNYL  (SUBLIMAZE ) injection 25 mcg   iohexol  (OMNIPAQUE ) 350 MG/ML injection 75 mL   morphine  (PF) 4 MG/ML injection 4 mg   ondansetron  (ZOFRAN ) injection 4 mg   amoxicillin-clavulanate (AUGMENTIN) 875-125 MG tablet  Sig: Take 1 tablet by mouth every 12 (twelve) hours.    Dispense:  14 tablet    Refill:  0    Supervising Provider:   Annabell Key, BRIAN [[id number]]   ondansetron  (ZOFRAN -ODT) 4 MG disintegrating tablet    Sig: Take 1 tablet (4 mg total) by mouth every 8 (eight) hours as needed.    Dispense:  20 tablet    Refill:  0    Supervising Provider:   Annabell Key, BRIAN [[id number]]   dicyclomine (BENTYL) 20 MG tablet    Sig: Take 1 tablet (20 mg total) by mouth 2 (two) times daily.    Dispense:  20 tablet    Refill:  0    Supervising Provider:   Annabell Key, BRIAN [[id number]]    -I have reviewed the patients home medicines and have made adjustments as needed     Reevaluation: After the interventions noted above, I reevaluated the patient and found that they have :improved  Co morbidities that complicate the patient evaluation  Past Medical History:  Diagnosis Date   Asthma    DVT (deep vein thrombosis) in pregnancy    Gestational diabetes    Herpes    Hypertension       Dispostion: Discharged in stable condition.  Return precaution discussed.  Patient voices understanding and is in agreement with plan.   Final Clinical Impression(s) / ED Diagnoses Final diagnoses:  Enteritis    Rx / DC Orders ED Discharge Orders          Ordered    amoxicillin-clavulanate (AUGMENTIN) 875-125 MG tablet  Every 12 hours        11/11/23 2008    ondansetron  (ZOFRAN -ODT) 4 MG disintegrating tablet  Every 8 hours PRN        11/11/23 2008    dicyclomine (BENTYL) 20 MG tablet  2 times daily        11/11/23 2008               Lucina Sabal, PA-C 11/11/23 2011    Burnette Carte, MD 11/12/23 [phone]

## 2023-11-11 NOTE — Discharge Instructions (Signed)
 Your workup showed potential urinary tract infection and evidence of enteritis on CT scan.  Antibiotic, Bentyl which is a pain medication for abdominal pain, and nausea medication called Zofran  has been sent into your pharmacy.  Return for any emergent symptoms.  Otherwise follow-up with your primary care doctor.

## 2023-11-11 NOTE — ED Triage Notes (Signed)
 Pt c/o all over abd pain x 1 day with diarrhea once yesterday and once today. Denies any vomiting. No fever.

## 2023-11-12 ENCOUNTER — Encounter: Payer: Self-pay | Admitting: Certified Nurse Midwife

## 2023-11-12 DIAGNOSIS — B9689 Other specified bacterial agents as the cause of diseases classified elsewhere: Secondary | ICD-10-CM

## 2023-11-12 MED ORDER — METRONIDAZOLE 500 MG PO TABS: 500.0000 mg | ORAL_TABLET | Freq: Two times a day (BID) | ORAL | 0 refills | Status: DC

## 2023-11-14 ENCOUNTER — Telehealth: Payer: Self-pay

## 2023-11-14 NOTE — Telephone Encounter (Addendum)
 Called patient regarding RN visit for treatment for gonorrhea. Patient would like appt after 3:30 PM. Scheduled patient for RN visit for 09/16/23 at 3:40 PM. Patient confirmed scheduled appointment.Patient also reports that she picked up medication for BV.  Patient denies any other needs at this time and was advised to contact our office for any other questions or concerns.   STD card faxed to [facility].  Moira Andrews, RN

## 2023-11-16 ENCOUNTER — Ambulatory Visit (INDEPENDENT_AMBULATORY_CARE_PROVIDER_SITE_OTHER)

## 2023-11-16 ENCOUNTER — Other Ambulatory Visit: Payer: Self-pay

## 2023-11-16 VITALS — BP 138/89 | HR 72

## 2023-11-16 DIAGNOSIS — A549 Gonococcal infection, unspecified: Secondary | ICD-10-CM | POA: Diagnosis not present

## 2023-11-16 MED ORDER — CEFTRIAXONE SODIUM 500 MG IJ SOLR
500.0000 mg | Freq: Once | INTRAMUSCULAR | Status: AC
  Administered 2023-11-16: 500 mg via INTRAMUSCULAR

## 2023-11-16 NOTE — Progress Notes (Signed)
 Tanya Fisher here for Rocephin injection for gonorrhea. Injection administered without complication. Patient will return in 3 months for test for reinfection. Will take Flagyl  previously sent for BV.  Seen at ED on 11/11/23 for abdominal and pelvic pain. Prescribed Augmentin for possible UTI and enteritis. Pain improved fully with Zofran  and Bentyl. Patient would like to know if she should take Augmentin. Reviewed with [name] who states it is okay to not take antibiotic since symptoms have resolved.   BP elevated today. Has prescription of amlodipine  from prior ED visit, but does not take this. Needs PCP. Referral placed 11/09/23. Also showed patient how to self schedule through MyChart.  [name], RN 11/16/2023  3:41 PM

## 2023-12-08 ENCOUNTER — Encounter: Payer: Self-pay | Admitting: General Practice

## 2024-02-16 ENCOUNTER — Ambulatory Visit

## 2024-02-21 ENCOUNTER — Other Ambulatory Visit (HOSPITAL_COMMUNITY)
Admission: RE | Admit: 2024-02-21 | Discharge: 2024-02-21 | Disposition: A | Discharge status: Home / Self Care | Source: Ambulatory Visit | Attending: Family Medicine | Admitting: Family Medicine

## 2024-02-21 ENCOUNTER — Ambulatory Visit (INDEPENDENT_AMBULATORY_CARE_PROVIDER_SITE_OTHER): Admitting: *Deleted

## 2024-02-21 ENCOUNTER — Other Ambulatory Visit: Payer: Self-pay

## 2024-02-21 VITALS — BP 121/80 | HR 64 | Wt 156.0 lb

## 2024-02-21 DIAGNOSIS — Z113 Encounter for screening for infections with a predominantly sexual mode of transmission: Secondary | ICD-10-CM

## 2024-02-21 NOTE — Progress Notes (Addendum)
  Patient tested positive for Gonorrhea in May.  She completed treatment at that time, and is here today for a TOC self swab.  She requests to be tested for Chlamydia, BV, and yeast as well.  Verbalizes that she has done a self swab before and is knowledgeable about how to obtain a self swab.  Verified that we have the correct phone number on file as well as the correct pharmacy.  Patient reports no symptoms today.  Discussed with patient that we will call/message her with the results if anything comes back positive.   Rosina, RN

## 2024-02-21 NOTE — Addendum Note (Signed)
 Addended by: ELAINE ROSINA SAILOR on: 02/21/2024 03:00 PM   Modules accepted: Orders

## 2024-02-22 LAB — CERVICOVAGINAL ANCILLARY ONLY
Bacterial Vaginitis (gardnerella): POSITIVE — AB
Candida Glabrata: NEGATIVE
Candida Vaginitis: NEGATIVE
Chlamydia: NEGATIVE
Comment: NEGATIVE
Comment: NEGATIVE
Comment: NEGATIVE
Comment: NEGATIVE
Comment: NEGATIVE
Comment: NORMAL
Neisseria Gonorrhea: NEGATIVE
Trichomonas: NEGATIVE

## 2024-02-23 ENCOUNTER — Ambulatory Visit: Payer: Self-pay | Admitting: Advanced Practice Midwife

## 2024-02-23 DIAGNOSIS — N760 Acute vaginitis: Secondary | ICD-10-CM

## 2024-02-23 MED ORDER — METRONIDAZOLE 500 MG PO TABS
500.0000 mg | ORAL_TABLET | Freq: Two times a day (BID) | ORAL | 0 refills | Status: DC
Start: 2024-02-23 — End: 2024-06-01

## 2024-03-13 ENCOUNTER — Emergency Department (HOSPITAL_BASED_OUTPATIENT_CLINIC_OR_DEPARTMENT_OTHER)

## 2024-03-13 ENCOUNTER — Emergency Department (HOSPITAL_BASED_OUTPATIENT_CLINIC_OR_DEPARTMENT_OTHER)
Admission: EM | Admit: 2024-03-13 | Discharge: 2024-03-13 | Disposition: A | Discharge status: Home / Self Care | Attending: Emergency Medicine | Admitting: Emergency Medicine

## 2024-03-13 ENCOUNTER — Other Ambulatory Visit (HOSPITAL_BASED_OUTPATIENT_CLINIC_OR_DEPARTMENT_OTHER): Payer: Self-pay

## 2024-03-13 ENCOUNTER — Other Ambulatory Visit: Payer: Self-pay

## 2024-03-13 DIAGNOSIS — I82451 Acute embolism and thrombosis of right peroneal vein: Secondary | ICD-10-CM | POA: Insufficient documentation

## 2024-03-13 DIAGNOSIS — M79661 Pain in right lower leg: Secondary | ICD-10-CM | POA: Diagnosis present

## 2024-03-13 LAB — CBC WITH DIFFERENTIAL/PLATELET
Abs Immature Granulocytes: 0.01 K/uL (ref 0.00–0.07)
Basophils Absolute: 0 K/uL (ref 0.0–0.1)
Basophils Relative: 0 %
Eosinophils Absolute: 0 K/uL (ref 0.0–0.5)
Eosinophils Relative: 1 %
HCT: 37.3 % (ref 36.0–46.0)
Hemoglobin: 12.1 g/dL (ref 12.0–15.0)
Immature Granulocytes: 0 %
Lymphocytes Relative: 44 %
Lymphs Abs: 2.2 K/uL (ref 0.7–4.0)
MCH: 29.7 pg (ref 26.0–34.0)
MCHC: 32.4 g/dL (ref 30.0–36.0)
MCV: 91.6 fL (ref 80.0–100.0)
Monocytes Absolute: 0.2 K/uL (ref 0.1–1.0)
Monocytes Relative: 5 %
Neutro Abs: 2.5 K/uL (ref 1.7–7.7)
Neutrophils Relative %: 50 %
Platelets: 268 K/uL (ref 150–400)
RBC: 4.07 MIL/uL (ref 3.87–5.11)
RDW: 13.8 % (ref 11.5–15.5)
WBC: 5 K/uL (ref 4.0–10.5)
nRBC: 0 % (ref 0.0–0.2)

## 2024-03-13 LAB — BASIC METABOLIC PANEL WITH GFR
Anion gap: 12 (ref 5–15)
BUN: 9 mg/dL (ref 6–20)
CO2: 24 mmol/L (ref 22–32)
Calcium: 10 mg/dL (ref 8.9–10.3)
Chloride: 104 mmol/L (ref 98–111)
Creatinine, Ser: 1.03 mg/dL — ABNORMAL HIGH (ref 0.44–1.00)
GFR, Estimated: >60 mL/min (ref >60)
Glucose, Bld: 76 mg/dL (ref 70–99)
Potassium: 4.2 mmol/L (ref 3.5–5.1)
Sodium: 140 mmol/L (ref 135–145)

## 2024-03-13 LAB — MAGNESIUM: Magnesium: 1.8 mg/dL (ref 1.7–2.4)

## 2024-03-13 LAB — CK: Total CK: 133 U/L (ref 38–234)

## 2024-03-13 MED ORDER — RIVAROXABAN (XARELTO) VTE STARTER PACK (15 & 20 MG)
ORAL_TABLET | ORAL | 0 refills | Status: AC
  Filled 2024-03-13: qty 51, 30d supply, fill #0

## 2024-03-13 MED ORDER — RIVAROXABAN (XARELTO) VTE STARTER PACK (15 & 20 MG)
ORAL_TABLET | ORAL | 0 refills | Status: DC
Start: 2024-03-13 — End: 2024-03-13

## 2024-03-13 NOTE — ED Triage Notes (Signed)
 Patient states bilateral calf pain she first noticed on Friday. Denies injury/redness/swelling. States hx of blood clots. Denies chest pain and shortness of breath.

## 2024-03-13 NOTE — ED Notes (Signed)
 RN reviewed discharge instructions with pt. Pt verbalized understanding and had no further questions. VSS upon discharge.

## 2024-03-13 NOTE — Discharge Instructions (Addendum)
 Your ultrasound raise concern for acute DVT, you are offered further evaluation by CT venogram but declined.  In the setting of concern for acute DVT, we will discharge you on a blood thinning medication. IMPRESSION:  1. Absent flow in one of the paired peroneal veins in the right  lower extremity may be a chronic finding and scarring related to  prior DVT. 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 Clinical correlation is recommended.  2. No DVT in the left lower extremity.    These results were called by telephone at the time of interpretation  on 03/13/2024 at 2:39 pm to provider [facility] , who verbally  acknowledged these results.

## 2024-03-13 NOTE — ED Provider Notes (Signed)
  [place] EMERGENCY DEPARTMENT AT [facility] Provider Note   CSN: [id number] Arrival date & time: 03/13/24  1105     Patient presents with: Leg Pain   Tanya Fisher is a 28 y.o. female.  {Add pertinent medical, surgical, social history, OB history to HPI:32947}  Leg Pain      Prior to Admission medications   Medication Sig Start Date End Date Taking? Authorizing Provider  amLODipine  (NORVASC ) 5 MG tablet Take 1 tablet (5 mg total) by mouth daily. Patient not taking: Reported on 11/09/2023 11/11/22   Krishnan, Gokul, MD  amoxicillin -clavulanate (AUGMENTIN ) 875-125 MG tablet Take 1 tablet by mouth every 12 (twelve) hours. Patient not taking: Reported on 11/16/2023 11/11/23   Hildegard Loge, PA-C  cyanocobalamin  1000 MCG tablet Take 1 tablet (1,000 mcg total) by mouth daily. Patient not taking: Reported on 11/11/2023 11/11/22   Krishnan, Gokul, MD  dicyclomine  (BENTYL ) 20 MG tablet Take 1 tablet (20 mg total) by mouth 2 (two) times daily. Patient not taking: Reported on 02/21/2024 11/11/23   Hildegard Loge, PA-C  ferrous sulfate  325 (65 FE) MG tablet Take 1 tablet (325 mg total) by mouth 2 (two) times daily with a meal. 11/12/22   Verdene Purchase, MD  folic acid  (FOLVITE ) 1 MG tablet Take 1 tablet (1 mg total) by mouth daily. Patient not taking: Reported on 11/09/2023 11/11/22   Krishnan, Gokul, MD  MAGNESIUM  PO Take 1 tablet by mouth daily.    [provider]  metroNIDAZOLE  (FLAGYL ) 500 MG tablet Take 1 tablet (500 mg total) by mouth 2 (two) times daily. 02/23/24   Smith, [place] , CNM  ondansetron  (ZOFRAN -ODT) 4 MG disintegrating tablet Take 1 tablet (4 mg total) by mouth every 8 (eight) hours as needed. Patient not taking: Reported on 02/21/2024 11/11/23   Hildegard Loge, PA-C  vitamin B-12 (CYANOCOBALAMIN ) 100 MCG tablet Take 100 mcg by mouth daily.    [provider]    Allergies: Ivp dye [iodinated contrast media]    Review of Systems  Updated Vital Signs BP 129/89 (BP  Location: Right Arm)   Pulse 89   Temp 98.1 F (36.7 C)   Resp 20   LMP 02/16/2024 (Approximate)   SpO2 100%   Physical Exam  (all labs ordered are listed, but only abnormal results are displayed) Labs Reviewed - No data to display  EKG: None  Radiology: No results found.  {Document cardiac monitor, telemetry assessment procedure when appropriate:32947} Procedures   Medications Ordered in the ED - No data to display    {Click here for ABCD2, HEART and other calculators REFRESH Note before signing:1}                              Medical Decision Making  ***  {Document critical care time when appropriate  Document review of labs and clinical decision tools ie CHADS2VASC2, etc  Document your independent review of radiology images and any outside records  Document your discussion with family members, caretakers and with consultants  Document social determinants of health affecting pt's care  Document your decision making why or why not admission, treatments were needed:32947:::1}   Final diagnoses:  None    ED Discharge Orders     None

## 2024-03-14 ENCOUNTER — Other Ambulatory Visit (HOSPITAL_BASED_OUTPATIENT_CLINIC_OR_DEPARTMENT_OTHER): Payer: Self-pay

## 2024-05-25 ENCOUNTER — Encounter (HOSPITAL_BASED_OUTPATIENT_CLINIC_OR_DEPARTMENT_OTHER): Payer: Self-pay | Admitting: Emergency Medicine

## 2024-05-25 ENCOUNTER — Other Ambulatory Visit: Payer: Self-pay

## 2024-05-25 ENCOUNTER — Emergency Department (HOSPITAL_BASED_OUTPATIENT_CLINIC_OR_DEPARTMENT_OTHER)
Admission: EM | Admit: 2024-05-25 | Discharge: 2024-05-25 | Disposition: A | Discharge status: Home / Self Care | Attending: Emergency Medicine | Admitting: Emergency Medicine

## 2024-05-25 DIAGNOSIS — K529 Noninfective gastroenteritis and colitis, unspecified: Secondary | ICD-10-CM | POA: Insufficient documentation

## 2024-05-25 DIAGNOSIS — R001 Bradycardia, unspecified: Secondary | ICD-10-CM | POA: Insufficient documentation

## 2024-05-25 DIAGNOSIS — I10 Essential (primary) hypertension: Secondary | ICD-10-CM | POA: Diagnosis not present

## 2024-05-25 DIAGNOSIS — R5383 Other fatigue: Secondary | ICD-10-CM | POA: Diagnosis present

## 2024-05-25 DIAGNOSIS — D649 Anemia, unspecified: Secondary | ICD-10-CM | POA: Insufficient documentation

## 2024-05-25 DIAGNOSIS — R7401 Elevation of levels of liver transaminase levels: Secondary | ICD-10-CM | POA: Insufficient documentation

## 2024-05-25 DIAGNOSIS — J45909 Unspecified asthma, uncomplicated: Secondary | ICD-10-CM | POA: Diagnosis not present

## 2024-05-25 LAB — URINALYSIS, ROUTINE W REFLEX MICROSCOPIC
Bacteria, UA: NONE SEEN
Bilirubin Urine: NEGATIVE
Glucose, UA: NEGATIVE mg/dL
Ketones, ur: NEGATIVE mg/dL
Leukocytes,Ua: NEGATIVE
Nitrite: NEGATIVE
Protein, ur: NEGATIVE mg/dL
Specific Gravity, Urine: <1.005 — ABNORMAL LOW (ref 1.005–1.030)
pH: 7 (ref 5.0–8.0)

## 2024-05-25 LAB — CBC WITH DIFFERENTIAL/PLATELET
Abs Immature Granulocytes: 0.01 K/uL (ref 0.00–0.07)
Basophils Absolute: 0 K/uL (ref 0.0–0.1)
Basophils Relative: 0 %
Eosinophils Absolute: 0 K/uL (ref 0.0–0.5)
Eosinophils Relative: 0 %
HCT: 35.3 % — ABNORMAL LOW (ref 36.0–46.0)
Hemoglobin: 11.6 g/dL — ABNORMAL LOW (ref 12.0–15.0)
Immature Granulocytes: 0 %
Lymphocytes Relative: 24 %
Lymphs Abs: 1.1 K/uL (ref 0.7–4.0)
MCH: 29.6 pg (ref 26.0–34.0)
MCHC: 32.9 g/dL (ref 30.0–36.0)
MCV: 90.1 fL (ref 80.0–100.0)
Monocytes Absolute: 0.2 K/uL (ref 0.1–1.0)
Monocytes Relative: 5 %
Neutro Abs: 3.3 K/uL (ref 1.7–7.7)
Neutrophils Relative %: 71 %
Platelets: 239 K/uL (ref 150–400)
RBC: 3.92 MIL/uL (ref 3.87–5.11)
RDW: 14.5 % (ref 11.5–15.5)
WBC: 4.7 K/uL (ref 4.0–10.5)
nRBC: 0 % (ref 0.0–0.2)

## 2024-05-25 LAB — RESP PANEL BY RT-PCR (RSV, FLU A&B, COVID)  RVPGX2
Influenza A by PCR: NEGATIVE
Influenza B by PCR: NEGATIVE
Resp Syncytial Virus by PCR: NEGATIVE
SARS Coronavirus 2 by RT PCR: NEGATIVE

## 2024-05-25 LAB — COMPREHENSIVE METABOLIC PANEL WITH GFR
ALT: 17 U/L (ref 0–44)
AST: 22 U/L (ref 15–41)
Albumin: 4 g/dL (ref 3.5–5.0)
Alkaline Phosphatase: 67 U/L (ref 38–126)
Anion gap: 9 (ref 5–15)
BUN: 5 mg/dL — ABNORMAL LOW (ref 6–20)
CO2: 26 mmol/L (ref 22–32)
Calcium: 9.5 mg/dL (ref 8.9–10.3)
Chloride: 105 mmol/L (ref 98–111)
Creatinine, Ser: 0.73 mg/dL (ref 0.44–1.00)
GFR, Estimated: >60 mL/min (ref >60)
Glucose, Bld: 150 mg/dL — ABNORMAL HIGH (ref 70–99)
Potassium: 3.7 mmol/L (ref 3.5–5.1)
Sodium: 140 mmol/L (ref 135–145)
Total Bilirubin: 0.9 mg/dL (ref 0.0–1.2)
Total Protein: 7.1 g/dL (ref 6.5–8.1)

## 2024-05-25 MED ORDER — ONDANSETRON 4 MG PO TBDP: 4.0000 mg | ORAL_TABLET | Freq: Three times a day (TID) | ORAL | 0 refills | Status: DC | PRN

## 2024-05-25 NOTE — Discharge Instructions (Addendum)
 You were seen in the ER today for concerns of fatigue and diarrhea. Your labs were thankfully reassuring without significant signs of infection or dehydration. I suspect you may have gastroenteritis, or a stomach bug.  You should focus on hydration to reduce risk of complications of this illness over the next Avril days.  Take Imodium as needed for diarrhea which is available over-the-counter.  I sent a prescription for nausea medication called Zofran .  Use this as prescribed.  For any concerns of new or worsening symptoms, return to the emergency department.

## 2024-05-25 NOTE — ED Notes (Signed)
 Pt had a episode of feeling flush and warm... Similar to her episodes at the house... Pt requested water, water given and cleared by Provider... Provider also informed of the episode... Currently Pt feels better and no longer having the episode.SABRASABRA

## 2024-05-25 NOTE — ED Notes (Signed)
 DC paperwork given and verbally understood.

## 2024-05-25 NOTE — ED Triage Notes (Signed)
 Pt caox4 ambulatory c/o malaise, chills x2 days, diarrhea this morning. Also states she started menstrual cycle yest with PMH anemia. Denies cough, congestion, vomiting.

## 2024-05-25 NOTE — ED Provider Notes (Signed)
 [place] EMERGENCY DEPARTMENT AT [facility] Provider Note   CSN: [id number] Arrival date & time: 05/25/24  1001     Patient presents with: Fatigue   Tanya Fisher is a 28 y.o. female.  Patient with past history significant for symptomatic bradycardia, DVT in pregnancy, PE, hypertension, asthma, gestational diabetes, and anemia presents emergency department with concerns of generalized malaise and chills for last 2 days.  Reports she had some diarrhea this morning but denies any nausea or vomiting.  No reported urinary symptoms.  Denies any cough, congestion, sore throat.  No clear sick contacts as far she is aware.  HPI     Prior to Admission medications   Medication Sig Start Date End Date Taking? Authorizing Provider  ondansetron  (ZOFRAN -ODT) 4 MG disintegrating tablet Take 1 tablet (4 mg total) by mouth every 8 (eight) hours as needed for nausea or vomiting. 05/25/24  Yes Johnhenry Tippin A, PA-C  amLODipine  (NORVASC ) 5 MG tablet Take 1 tablet (5 mg total) by mouth daily. Patient not taking: Reported on 11/09/2023 11/11/22   Krishnan, Gokul, MD  amoxicillin -clavulanate (AUGMENTIN ) 875-125 MG tablet Take 1 tablet by mouth every 12 (twelve) hours. Patient not taking: Reported on 11/16/2023 11/11/23   Hildegard Loge, PA-C  cyanocobalamin  1000 MCG tablet Take 1 tablet (1,000 mcg total) by mouth daily. Patient not taking: Reported on 11/11/2023 11/11/22   Krishnan, Gokul, MD  dicyclomine  (BENTYL ) 20 MG tablet Take 1 tablet (20 mg total) by mouth 2 (two) times daily. Patient not taking: Reported on 02/21/2024 11/11/23   Hildegard Loge, PA-C  ferrous sulfate  325 (65 FE) MG tablet Take 1 tablet (325 mg total) by mouth 2 (two) times daily with a meal. 11/12/22   Verdene Purchase, MD  folic acid  (FOLVITE ) 1 MG tablet Take 1 tablet (1 mg total) by mouth daily. Patient not taking: Reported on 11/09/2023 11/11/22   Krishnan, Gokul, MD  MAGNESIUM  PO Take 1 tablet by mouth daily.    [provider]   metroNIDAZOLE  (FLAGYL ) 500 MG tablet Take 1 tablet (500 mg total) by mouth 2 (two) times daily. 02/23/24   Smith, Virginia , CNM  RIVAROXABAN  (XARELTO ) VTE STARTER PACK (15 & 20 MG) Follow package directions: Take one 15mg  tablet by mouth twice a day. On day 22, switch to one 20mg  tablet once a day. Take with food. 03/13/24   Jerrol Agent, MD  vitamin B-12 (CYANOCOBALAMIN ) 100 MCG tablet Take 100 mcg by mouth daily.    [provider]    Allergies: Ivp dye [iodinated contrast media]    Review of Systems  Constitutional:  Positive for fatigue.  All other systems reviewed and are negative.   Updated Vital Signs BP (!) 123/98 (BP Location: Right Arm)   Pulse 65   Temp 98.2 F (36.8 C) (Oral)   Resp 16   Ht 5' 3 (1.6 m)   Wt 66.2 kg   LMP 05/23/2024 (Exact Date)   SpO2 100%   BMI 25.86 kg/m   Physical Exam Vitals and nursing note reviewed.  Constitutional:      General: She is not in acute distress.    Appearance: She is well-developed.  HENT:     Head: Normocephalic and atraumatic.  Eyes:     General: No scleral icterus.       Right eye: No discharge.        Left eye: No discharge.     Conjunctiva/sclera: Conjunctivae normal.  Cardiovascular:     Rate and Rhythm: Normal  rate and regular rhythm.     Heart sounds: No murmur heard. Pulmonary:     Effort: Pulmonary effort is normal. No respiratory distress.     Breath sounds: Normal breath sounds.  Abdominal:     Palpations: Abdomen is soft.     Tenderness: There is no abdominal tenderness.  Musculoskeletal:        General: No swelling.     Cervical back: Neck supple.  Skin:    General: Skin is warm and dry.     Capillary Refill: Capillary refill takes less than 2 seconds.  Neurological:     Mental Status: She is alert.  Psychiatric:        Mood and Affect: Mood normal.     (all labs ordered are listed, but only abnormal results are displayed) Labs Reviewed  CBC WITH DIFFERENTIAL/PLATELET - Abnormal;  Notable for the following components:      Result Value   Hemoglobin 11.6 (*)    HCT 35.3 (*)    All other components within normal limits  COMPREHENSIVE METABOLIC PANEL WITH GFR - Abnormal; Notable for the following components:   Glucose, Bld 150 (*)    BUN 5 (*)    All other components within normal limits  URINALYSIS, ROUTINE W REFLEX MICROSCOPIC - Abnormal; Notable for the following components:   Color, Urine COLORLESS (*)    Specific Gravity, Urine <1.005 (*)    Hgb urine dipstick LARGE (*)    All other components within normal limits  RESP PANEL BY RT-PCR (RSV, FLU A&B, COVID)  RVPGX2    EKG: None  Radiology: No results found.   Procedures   Medications Ordered in the ED - No data to display                                  Medical Decision Making Amount and/or Complexity of Data Reviewed Labs: ordered.   This patient presents to the ED for concern of fatigue.  Differential diagnosis includes anemia, viral URI, UTI, dehydration    Additional history obtained:  Additional history obtained from chart review   Lab Tests:  I Ordered, and personally interpreted labs.  The pertinent results include: CBC unremarkable with baseline hemoglobin 11.6, respiratory panel negative for COVID-19, influenza, RSV, CMP unremarkable with exception of mild hyperglycemia which is expected given this was a nonfasting study, UA negative for infection and some blood present but likely due to menstrual cycle   Problem List / ED Course:  Patient with past history significant for symptomatic bradycardia, DVT in pregnancy, PE, anemia, gestational diabetes, hypertension presents to the ED with concerns of fatigue.  Reports that she has been feeling generalized fatigue malaise for the last 2 days.  She does report that she is currently on her period.  She denies any other specific concerns such as cough, congestion, sore throat but does report that she has had some chills but no fevers.   She is well-appearing at this time.  Not currently taking any medications at home to try to manage her symptoms.  Denies any sick contacts. Physical exam is largely reassuring with no abnormal heart or lung sounds.  There is no focal abdominal tenderness.  Vitals unremarkable. Patient's workup is reassuring.  No leukocytosis and hemoglobin is stable at 11.6 with patient's baseline anemia.  CMP without any significant concerning findings such as dehydration, AKI, transaminitis, or elevated bilirubin.  UA without signs of  infection.  Respiratory panel negative. On reevaluation, patient is stable.  She appears to be having a hot flash at this time but no measured temperature.  Given her symptoms reassuring workup, suspect possible gastroenteritis.  Advise management of symptoms with Imodium for diarrhea and Zofran  for nausea.  Encouraged continued use of Tylenol  and ibuprofen .  Return precautions discussed such as concerns for new or worsening symptoms.  She is otherwise stable for outpatient follow-up and discharged home.   Social Determinants of Health:  None  Final diagnoses:  Gastroenteritis  Anemia, unspecified type    ED Discharge Orders          Ordered    ondansetron  (ZOFRAN -ODT) 4 MG disintegrating tablet  Every 8 hours PRN        05/25/24 1243               Akshith Moncus A, PA-C 05/25/24 1245    Emil Share, DO 05/25/24 1428

## 2024-06-01 ENCOUNTER — Ambulatory Visit (HOSPITAL_COMMUNITY)
Admission: EM | Admit: 2024-06-01 | Discharge: 2024-06-01 | Disposition: A | Discharge status: Home / Self Care | Attending: Physician Assistant | Admitting: Physician Assistant

## 2024-06-01 ENCOUNTER — Encounter (HOSPITAL_COMMUNITY): Payer: Self-pay | Admitting: *Deleted

## 2024-06-01 DIAGNOSIS — I10 Essential (primary) hypertension: Secondary | ICD-10-CM | POA: Insufficient documentation

## 2024-06-01 DIAGNOSIS — N898 Other specified noninflammatory disorders of vagina: Secondary | ICD-10-CM | POA: Insufficient documentation

## 2024-06-01 DIAGNOSIS — Z113 Encounter for screening for infections with a predominantly sexual mode of transmission: Secondary | ICD-10-CM | POA: Diagnosis present

## 2024-06-01 MED ORDER — METRONIDAZOLE 500 MG PO TABS: 500.0000 mg | ORAL_TABLET | Freq: Two times a day (BID) | ORAL | 0 refills | Status: AC

## 2024-06-01 NOTE — ED Provider Notes (Signed)
 [facility]    CSN: [id number] Arrival date & time: 06/01/24  1623      History   Chief Complaint Chief Complaint  Patient presents with   Vaginal Discharge    HPI Tanya Fisher is a 28 y.o. female.   Patient presents today with a several day history of vaginal odor and associated vaginal discharge.  She denies any additional symptoms including fever, nausea, vomiting, abdominal pain.  She does have a history of BV but has not had recurrent episodes recently.  She denies any recent antibiotics or medication changes.  She does not take SGLT2 inhibitor and denies history of diabetes.  She is confident that she is not pregnant.  She has not tried any over-the-counter medication for symptom management.  She is requesting a complete STI panel including blood work for HIV and syphilis.  She had negative hepatitis C screening on 09/21/2022.  Patient was noted to be mildly hypertensive.  She does have a history of hypertension has previously been prescribed amlodipine  but has not been taking this medication recently as it caused headache.  She does not currently of a primary care but is open to establishing with someone.  She denies any chest pain, shortness of breath, headache, vision change, dizziness.    Past Medical History:  Diagnosis Date   Asthma    DVT (deep vein thrombosis) in pregnancy    Gestational diabetes    Herpes    Hypertension     Patient Active Problem List   Diagnosis Date Noted   Symptomatic bradycardia 11/10/2022   Essential hypertension 11/10/2022   Near syncope 11/09/2022   Pulmonary embolism (HCC) 08/04/2021   Recurrent HSV (herpes simplex virus) 10/11/2019   Vaginal itching 10/11/2019   Status post cesarean section 04/24/2019   History of cesarean delivery 04/09/2019   History of herpes simplex infection 02/28/2019   GDM (gestational diabetes mellitus) 02/28/2019   Trichomoniasis 01/29/2019   Substance abuse affecting pregnancy,  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   Supervision of high risk pregnancy, antepartum 04/29/2017   DVT (deep vein thrombosis) in pregnancy 04/29/2017   History of VBAC 04/29/2017    Past Surgical History:  Procedure Laterality Date   CESAREAN SECTION     CESAREAN SECTION N/A 04/24/2019   Procedure: CESAREAN SECTION;  Surgeon: Barbra Lang PARAS, DO;  Location: [facility]S;  Service: Obstetrics;  Laterality: N/A;   DILATION AND EVACUATION     Therapeutic abortion 14 wks    OB History     Gravida  4   Para  3   Term  3   Preterm  0   AB  1   Living  3      SAB  0   IAB  1   Ectopic  0   Multiple  0   Live Births  3            Home Medications    Prior to Admission medications   Medication Sig Start Date End Date Taking? Authorizing Provider  ferrous sulfate  325 (65 FE) MG tablet Take 1 tablet (325 mg total) by mouth 2 (two) times daily with a meal. 11/12/22  Yes Verdene Purchase, MD  MAGNESIUM  PO Take 1 tablet by mouth daily.   Yes [provider]  metroNIDAZOLE  (FLAGYL ) 500 MG tablet Take 1 tablet (500 mg total) by mouth 2 (two) times daily. 06/01/24  Yes Colbin Jovel K, PA-C  vitamin B-12 (  CYANOCOBALAMIN ) 100 MCG tablet Take 100 mcg by mouth daily.   Yes [provider]  RIVAROXABAN  (XARELTO ) VTE STARTER PACK (15 & 20 MG) Follow package directions: Take one 15mg  tablet by mouth twice a day. On day 22, switch to one 20mg  tablet once a day. Take with food. Patient not taking: Reported on 06/01/2024 03/13/24   Jerrol Agent, MD    Family History Family History  Problem Relation Age of Onset   Hypertension Mother    Diabetes Father    Hypertension Father     Social History Social History   Tobacco Use   Smoking status: Former    Current packs/day: 0.00    Types: Cigarettes    Quit date: 07/06/2015    Years since quitting: 8.9   Smokeless tobacco: Never  Vaping Use   Vaping status: Every Day   Substance Use Topics   Alcohol use: Not Currently   Drug use: Yes    Frequency: 1.0 times per week    Types: Marijuana     Allergies   Ivp dye [iodinated contrast media]   Review of Systems Review of Systems  Constitutional:  Positive for activity change. Negative for appetite change, fatigue and fever.  Gastrointestinal:  Negative for diarrhea, nausea and vomiting.  Genitourinary:  Positive for vaginal discharge. Negative for dysuria, frequency, pelvic pain, urgency, vaginal bleeding and vaginal pain.     Physical Exam Triage Vital Signs ED Triage Vitals  Encounter Vitals Group     BP 06/01/24 1831 (!) 148/102     Girls Systolic BP Percentile --      Girls Diastolic BP Percentile --      Boys Systolic BP Percentile --      Boys Diastolic BP Percentile --      Pulse Rate 06/01/24 1831 (!) 55     Resp 06/01/24 1831 16     Temp 06/01/24 1831 98.7 F (37.1 C)     Temp Source 06/01/24 1831 Oral     SpO2 06/01/24 1831 98 %     Weight --      Height --      Head Circumference --      Peak Flow --      Pain Score 06/01/24 1832 0     Pain Loc --      Pain Education --      Exclude from Growth Chart --    No data found.  Updated Vital Signs BP (!) 148/102 (BP Location: Right Arm)   Pulse (!) 55   Temp 98.7 F (37.1 C) (Oral)   Resp 16   LMP 05/23/2024 (Exact Date)   SpO2 98%   Visual Acuity Right Eye Distance:   Left Eye Distance:   Bilateral Distance:    Right Eye Near:   Left Eye Near:    Bilateral Near:     Physical Exam Vitals reviewed.  Constitutional:      General: She is awake. She is not in acute distress.    Appearance: Normal appearance. She is well-developed. She is not ill-appearing.     Comments: Very pleasant female appears in today to no acute distress sitting comfortable in exam room  HENT:     Head: Normocephalic and atraumatic.  Cardiovascular:     Rate and Rhythm: Normal rate and regular rhythm.     Heart sounds: Normal heart  sounds, S1 normal and S2 normal. No murmur heard. Pulmonary:     Effort: Pulmonary effort is normal.  Breath sounds: Normal breath sounds. No wheezing, rhonchi or rales.     Comments: Clear to auscultation bilaterally Abdominal:     Palpations: Abdomen is soft.     Tenderness: There is no abdominal tenderness. There is no right CVA tenderness, left CVA tenderness, guarding or rebound.     Comments: Benign abdominal exam.  Psychiatric:        Behavior: Behavior is cooperative.      UC Treatments / Results  Labs (all labs ordered are listed, but only abnormal results are displayed) Labs Reviewed  SYPHILIS: RPR W/REFLEX TO RPR TITER AND TREPONEMAL ANTIBODIES, TRADITIONAL SCREENING AND DIAGNOSIS ALGORITHM  HIV ANTIBODY (ROUTINE TESTING W REFLEX)  CERVICOVAGINAL ANCILLARY ONLY    EKG   Radiology No results found.  Procedures Procedures (including critical care time)  Medications Ordered in UC Medications - No data to display  Initial Impression / Assessment and Plan / UC Course  I have reviewed the triage vital signs and the nursing notes.  Pertinent labs & imaging results that were available during my care of the patient were reviewed by me and considered in my medical decision making (see chart for details).     Patient is well-appearing, afebrile, nontoxic, nontachycardic.  Concern for bacterial vaginosis given her clinical presentation.  Will empirically treat with metronidazole  twice daily for 7 days we discussed that she should avoid alcohol while on this medication and for 3 days after completing course of medication due to associated Antabuse side effects.  STI swab was collected and is pending.  Additional testing for HIV and syphilis was obtained and is pending.  We will contact her if we need to arrange additional treatment based on her swab and testing results.  Discussed that she should abstain from sex until she receives her results.  If she develops any pelvic  pain, abdominal pain, fever, nausea, vomiting she needs to be seen immediately.  Her blood pressure is elevated and has been elevated the last several visits.  She denies any signs or symptoms of endorgan damage.  We discussed that he does qualify for starting blood pressure medication.  She does not currently have a primary care so we did establish her with someone and scheduled an appointment before discharge with instruction to monitor her blood pressure until this visit and keep a log for evaluation at her follow-up appointment.  If she has any difficulty seeing them she can return here and we can consider initiating medication.  She is to avoid decongestants, caffeine, sodium, NSAIDs.  We discussed that if she develops any chest pain, shortness of breath, headache, vision change, dizziness in setting of high blood pressure she needs to be seen immediately.  Strict return precautions given.  Patient expressed understanding and agreement treatment plan.  Final Clinical Impressions(s) / UC Diagnoses   Final diagnoses:  Vaginal discharge  Screening examination for STI  Elevated blood pressure reading with diagnosis of hypertension     Discharge Instructions      We are treating you for bacterial vaginosis.  Start metronidazole  twice daily for 7 days.  Avoid alcohol while on this medication for 3 days after you finish the course.  Will contact you if any other testing is positive and we need to arrange additional treatment.  Abstain from sex until you receive the results.  If develop any pelvic pain, abdominal pain, fever, nausea, vomiting you need to be seen immediately.  Your blood pressure was elevated today and has been elevated the last  several visits.  Please follow-up with primary care as scheduled.  Avoid decongestants, caffeine, sodium, NSAIDs (aspirin, ibuprofen /Advil , naproxen/Aleve).  If you develop any chest pain, shortness of breath, headache, vision change, dizziness in setting of  high blood pressure you need to go to the ER.     ED Prescriptions     Medication Sig Dispense Auth. Provider   metroNIDAZOLE  (FLAGYL ) 500 MG tablet Take 1 tablet (500 mg total) by mouth 2 (two) times daily. 14 tablet Talita Recht K, PA-C      PDMP not reviewed this encounter.   Sherrell Rocky POUR, PA-C 06/01/24 1934

## 2024-06-01 NOTE — Discharge Instructions (Signed)
 We are treating you for bacterial vaginosis.  Start metronidazole  twice daily for 7 days.  Avoid alcohol while on this medication for 3 days after you finish the course.  Will contact you if any other testing is positive and we need to arrange additional treatment.  Abstain from sex until you receive the results.  If develop any pelvic pain, abdominal pain, fever, nausea, vomiting you need to be seen immediately.  Your blood pressure was elevated today and has been elevated the last several visits.  Please follow-up with primary care as scheduled.  Avoid decongestants, caffeine, sodium, NSAIDs (aspirin, ibuprofen /Advil , naproxen/Aleve).  If you develop any chest pain, shortness of breath, headache, vision change, dizziness in setting of high blood pressure you need to go to the ER.

## 2024-06-01 NOTE — ED Triage Notes (Signed)
 Reports 4 days of vaginal discharge with fishy smell. She would like STI screening

## 2024-06-02 LAB — SYPHILIS: RPR W/REFLEX TO RPR TITER AND TREPONEMAL ANTIBODIES, TRADITIONAL SCREENING AND DIAGNOSIS ALGORITHM: RPR Ser Ql: NONREACTIVE

## 2024-06-02 LAB — HIV ANTIBODY (ROUTINE TESTING W REFLEX): HIV Screen 4th Generation wRfx: REACTIVE — AB

## 2024-06-04 ENCOUNTER — Ambulatory Visit (HOSPITAL_COMMUNITY): Payer: Self-pay

## 2024-06-04 LAB — CERVICOVAGINAL ANCILLARY ONLY
Bacterial Vaginitis (gardnerella): POSITIVE — AB
Candida Glabrata: NEGATIVE
Candida Vaginitis: NEGATIVE
Chlamydia: NEGATIVE
Comment: NEGATIVE
Comment: NEGATIVE
Comment: NEGATIVE
Comment: NEGATIVE
Comment: NEGATIVE
Comment: NORMAL
Neisseria Gonorrhea: NEGATIVE
Trichomonas: NEGATIVE

## 2024-06-04 LAB — HIV-1/HIV-2 QUALITATIVE RNA
Final Interpretation: NEGATIVE
HIV-1 RNA, Qualitative: NONREACTIVE
HIV-2 RNA, Qualitative: NONREACTIVE

## 2024-06-04 LAB — HIV-1/2 AB - DIFFERENTIATION
HIV 1 Ab: NONREACTIVE
HIV 2 Ab: NONREACTIVE
Note: NEGATIVE

## 2024-07-31 ENCOUNTER — Ambulatory Visit: Admitting: Nurse Practitioner

## 2024-09-21 ENCOUNTER — Ambulatory Visit: Admitting: Nurse Practitioner
